# Patient Record
Sex: Male | Born: 1960
Health system: Southern US, Community
[De-identification: ages and names within clinical notes are randomized; demographics above are authoritative.]

## PROBLEM LIST (undated history)

## (undated) DIAGNOSIS — I639 Cerebral infarction, unspecified: Secondary | ICD-10-CM

## (undated) DIAGNOSIS — I10 Essential (primary) hypertension: Secondary | ICD-10-CM

## (undated) DIAGNOSIS — F1010 Alcohol abuse, uncomplicated: Secondary | ICD-10-CM

## (undated) HISTORY — PX: POLYPECTOMY: SHX149

## (undated) HISTORY — PX: COLONOSCOPY: SHX174

## (undated) HISTORY — DX: Cerebral infarction, unspecified: I63.9

---

## 2002-09-28 ENCOUNTER — Emergency Department (HOSPITAL_COMMUNITY): Admission: EM | Admit: 2002-09-28 | Discharge: 2002-09-28 | Payer: Self-pay | Admitting: Emergency Medicine

## 2003-09-14 ENCOUNTER — Emergency Department (HOSPITAL_COMMUNITY): Admission: EM | Admit: 2003-09-14 | Discharge: 2003-09-14 | Payer: Self-pay | Admitting: Emergency Medicine

## 2003-09-22 ENCOUNTER — Emergency Department (HOSPITAL_COMMUNITY): Admission: EM | Admit: 2003-09-22 | Discharge: 2003-09-22 | Payer: Self-pay | Admitting: Family Medicine

## 2003-10-10 ENCOUNTER — Emergency Department (HOSPITAL_COMMUNITY): Admission: EM | Admit: 2003-10-10 | Discharge: 2003-10-10 | Payer: Self-pay | Admitting: Family Medicine

## 2010-02-24 ENCOUNTER — Encounter: Payer: Self-pay | Admitting: Cardiology

## 2015-01-16 ENCOUNTER — Emergency Department (HOSPITAL_COMMUNITY): Payer: Medicaid Other

## 2015-01-16 ENCOUNTER — Inpatient Hospital Stay (HOSPITAL_COMMUNITY)
Admission: EM | Admit: 2015-01-16 | Discharge: 2015-01-19 | DRG: 065 | Disposition: A | Discharge status: Rehabilitation Facility | Payer: Medicaid Other | Attending: Internal Medicine | Admitting: Internal Medicine

## 2015-01-16 ENCOUNTER — Encounter (HOSPITAL_COMMUNITY): Payer: Self-pay | Admitting: Emergency Medicine

## 2015-01-16 DIAGNOSIS — Z720 Tobacco use: Secondary | ICD-10-CM | POA: Diagnosis present

## 2015-01-16 DIAGNOSIS — I63519 Cerebral infarction due to unspecified occlusion or stenosis of unspecified middle cerebral artery: Principal | ICD-10-CM | POA: Diagnosis present

## 2015-01-16 DIAGNOSIS — F1010 Alcohol abuse, uncomplicated: Secondary | ICD-10-CM | POA: Diagnosis present

## 2015-01-16 DIAGNOSIS — E785 Hyperlipidemia, unspecified: Secondary | ICD-10-CM | POA: Diagnosis present

## 2015-01-16 DIAGNOSIS — I639 Cerebral infarction, unspecified: Secondary | ICD-10-CM | POA: Diagnosis present

## 2015-01-16 DIAGNOSIS — F1290 Cannabis use, unspecified, uncomplicated: Secondary | ICD-10-CM | POA: Diagnosis present

## 2015-01-16 DIAGNOSIS — G459 Transient cerebral ischemic attack, unspecified: Secondary | ICD-10-CM

## 2015-01-16 DIAGNOSIS — Z9119 Patient's noncompliance with other medical treatment and regimen: Secondary | ICD-10-CM

## 2015-01-16 DIAGNOSIS — I10 Essential (primary) hypertension: Secondary | ICD-10-CM | POA: Diagnosis present

## 2015-01-16 DIAGNOSIS — G8191 Hemiplegia, unspecified affecting right dominant side: Secondary | ICD-10-CM | POA: Diagnosis present

## 2015-01-16 DIAGNOSIS — I6529 Occlusion and stenosis of unspecified carotid artery: Secondary | ICD-10-CM | POA: Diagnosis present

## 2015-01-16 DIAGNOSIS — I672 Cerebral atherosclerosis: Secondary | ICD-10-CM | POA: Diagnosis present

## 2015-01-16 DIAGNOSIS — F17210 Nicotine dependence, cigarettes, uncomplicated: Secondary | ICD-10-CM | POA: Diagnosis present

## 2015-01-16 DIAGNOSIS — Z8249 Family history of ischemic heart disease and other diseases of the circulatory system: Secondary | ICD-10-CM

## 2015-01-16 HISTORY — DX: Essential (primary) hypertension: I10

## 2015-01-16 LAB — CBC
HEMATOCRIT: 47.3 % (ref 39.0–52.0)
Hemoglobin: 16.5 g/dL (ref 13.0–17.0)
MCH: 31.1 pg (ref 26.0–34.0)
MCHC: 34.9 g/dL (ref 30.0–36.0)
MCV: 89.1 fL (ref 78.0–100.0)
PLATELETS: 284 10*3/uL (ref 150–400)
RBC: 5.31 MIL/uL (ref 4.22–5.81)
RDW: 12.8 % (ref 11.5–15.5)
WBC: 11.7 10*3/uL — AB (ref 4.0–10.5)

## 2015-01-16 LAB — BASIC METABOLIC PANEL
Anion gap: 11 (ref 5–15)
BUN: 12 mg/dL (ref 6–20)
CHLORIDE: 103 mmol/L (ref 101–111)
CO2: 23 mmol/L (ref 22–32)
Calcium: 9.3 mg/dL (ref 8.9–10.3)
Creatinine, Ser: 1.11 mg/dL (ref 0.61–1.24)
Glucose, Bld: 98 mg/dL (ref 65–99)
POTASSIUM: 3.5 mmol/L (ref 3.5–5.1)
SODIUM: 137 mmol/L (ref 135–145)

## 2015-01-16 LAB — URINALYSIS, ROUTINE W REFLEX MICROSCOPIC
Bilirubin Urine: NEGATIVE
GLUCOSE, UA: NEGATIVE mg/dL
Hgb urine dipstick: NEGATIVE
Ketones, ur: NEGATIVE mg/dL
LEUKOCYTES UA: NEGATIVE
NITRITE: NEGATIVE
PH: 6 (ref 5.0–8.0)
PROTEIN: NEGATIVE mg/dL
Specific Gravity, Urine: 1.008 (ref 1.005–1.030)

## 2015-01-16 LAB — CBG MONITORING, ED: Glucose-Capillary: 84 mg/dL (ref 65–99)

## 2015-01-16 LAB — ETHANOL: ALCOHOL ETHYL (B): 45 mg/dL — AB (ref <5)

## 2015-01-16 NOTE — ED Notes (Signed)
Dr Zenia Resides notified of right sided weakness and elevated blood pressure

## 2015-01-16 NOTE — ED Notes (Addendum)
Per pt and pt's friend state that starting yesterday around 1300 he began feeling numbness on the bottom of his right foot. It slowly spread to his right arm. Pt feels sensation equally on left and right side. Pt has facial symmetry. Pt is weaker in his right arm and right leg and experiences less dexterity with his right arm.  Pt states he drinks at least 6 beers per day every day

## 2015-01-16 NOTE — ED Notes (Signed)
Pt from home presenting with right sided weakness that started yesterday. It began in his right foot and spread up to his right arm. Pt has no known history of hypertension, but has a blood pressure of 181/105 at admission.

## 2015-01-16 NOTE — ED Notes (Signed)
Pt to CT

## 2015-01-17 ENCOUNTER — Encounter (HOSPITAL_COMMUNITY): Payer: Self-pay | Admitting: Internal Medicine

## 2015-01-17 ENCOUNTER — Observation Stay (HOSPITAL_COMMUNITY): Payer: Medicaid Other

## 2015-01-17 ENCOUNTER — Observation Stay (HOSPITAL_BASED_OUTPATIENT_CLINIC_OR_DEPARTMENT_OTHER): Payer: Medicaid Other

## 2015-01-17 DIAGNOSIS — I672 Cerebral atherosclerosis: Secondary | ICD-10-CM | POA: Diagnosis present

## 2015-01-17 DIAGNOSIS — I6789 Other cerebrovascular disease: Secondary | ICD-10-CM | POA: Diagnosis not present

## 2015-01-17 DIAGNOSIS — R030 Elevated blood-pressure reading, without diagnosis of hypertension: Secondary | ICD-10-CM

## 2015-01-17 DIAGNOSIS — Z9119 Patient's noncompliance with other medical treatment and regimen: Secondary | ICD-10-CM | POA: Diagnosis not present

## 2015-01-17 DIAGNOSIS — F17200 Nicotine dependence, unspecified, uncomplicated: Secondary | ICD-10-CM | POA: Diagnosis not present

## 2015-01-17 DIAGNOSIS — Z720 Tobacco use: Secondary | ICD-10-CM | POA: Diagnosis not present

## 2015-01-17 DIAGNOSIS — F1010 Alcohol abuse, uncomplicated: Secondary | ICD-10-CM | POA: Diagnosis present

## 2015-01-17 DIAGNOSIS — I63519 Cerebral infarction due to unspecified occlusion or stenosis of unspecified middle cerebral artery: Secondary | ICD-10-CM | POA: Diagnosis not present

## 2015-01-17 DIAGNOSIS — F17210 Nicotine dependence, cigarettes, uncomplicated: Secondary | ICD-10-CM | POA: Diagnosis present

## 2015-01-17 DIAGNOSIS — I10 Essential (primary) hypertension: Secondary | ICD-10-CM

## 2015-01-17 DIAGNOSIS — E785 Hyperlipidemia, unspecified: Secondary | ICD-10-CM | POA: Diagnosis present

## 2015-01-17 DIAGNOSIS — I6339 Cerebral infarction due to thrombosis of other cerebral artery: Secondary | ICD-10-CM

## 2015-01-17 DIAGNOSIS — G459 Transient cerebral ischemic attack, unspecified: Secondary | ICD-10-CM

## 2015-01-17 DIAGNOSIS — I639 Cerebral infarction, unspecified: Secondary | ICD-10-CM

## 2015-01-17 DIAGNOSIS — IMO0001 Reserved for inherently not codable concepts without codable children: Secondary | ICD-10-CM | POA: Insufficient documentation

## 2015-01-17 DIAGNOSIS — Z8249 Family history of ischemic heart disease and other diseases of the circulatory system: Secondary | ICD-10-CM | POA: Diagnosis not present

## 2015-01-17 DIAGNOSIS — G8191 Hemiplegia, unspecified affecting right dominant side: Secondary | ICD-10-CM | POA: Diagnosis not present

## 2015-01-17 DIAGNOSIS — G458 Other transient cerebral ischemic attacks and related syndromes: Secondary | ICD-10-CM

## 2015-01-17 DIAGNOSIS — I6529 Occlusion and stenosis of unspecified carotid artery: Secondary | ICD-10-CM | POA: Diagnosis present

## 2015-01-17 DIAGNOSIS — F1290 Cannabis use, unspecified, uncomplicated: Secondary | ICD-10-CM | POA: Diagnosis present

## 2015-01-17 DIAGNOSIS — I63411 Cerebral infarction due to embolism of right middle cerebral artery: Secondary | ICD-10-CM | POA: Diagnosis not present

## 2015-01-17 LAB — LIPID PANEL
CHOLESTEROL: 195 mg/dL (ref 0–200)
HDL: 48 mg/dL (ref >40)
LDL Cholesterol: 133 mg/dL — ABNORMAL HIGH (ref 0–99)
TRIGLYCERIDES: 72 mg/dL (ref <150)
Total CHOL/HDL Ratio: 4.1 RATIO
VLDL: 14 mg/dL (ref 0–40)

## 2015-01-17 LAB — RAPID URINE DRUG SCREEN, HOSP PERFORMED
AMPHETAMINES: NOT DETECTED
BARBITURATES: NOT DETECTED
Benzodiazepines: NOT DETECTED
Cocaine: NOT DETECTED
Opiates: NOT DETECTED
TETRAHYDROCANNABINOL: POSITIVE — AB

## 2015-01-17 LAB — TROPONIN I: Troponin I: <0.03 ng/mL (ref <0.031)

## 2015-01-17 MED ORDER — ADULT MULTIVITAMIN W/MINERALS CH
1.0000 | ORAL_TABLET | Freq: Every day | ORAL | Status: DC
  Administered 2015-01-17 – 2015-01-19 (×3): 1 via ORAL
  Filled 2015-01-17 (×3): qty 1

## 2015-01-17 MED ORDER — ACETAMINOPHEN 325 MG PO TABS: 650.0000 mg | ORAL_TABLET | ORAL | Status: DC | PRN

## 2015-01-17 MED ORDER — SENNOSIDES-DOCUSATE SODIUM 8.6-50 MG PO TABS: 1.0000 | ORAL_TABLET | Freq: Every evening | ORAL | Status: DC | PRN

## 2015-01-17 MED ORDER — THIAMINE HCL 100 MG/ML IJ SOLN: 100.0000 mg | Freq: Every day | INTRAMUSCULAR | Status: DC

## 2015-01-17 MED ORDER — VITAMIN B-1 100 MG PO TABS
100.0000 mg | ORAL_TABLET | Freq: Every day | ORAL | Status: DC
  Administered 2015-01-17 – 2015-01-19 (×3): 100 mg via ORAL
  Filled 2015-01-17 (×3): qty 1

## 2015-01-17 MED ORDER — ASPIRIN 300 MG RE SUPP: 300.0000 mg | Freq: Every day | RECTAL | Status: DC

## 2015-01-17 MED ORDER — ASPIRIN 325 MG PO TABS
325.0000 mg | ORAL_TABLET | Freq: Every day | ORAL | Status: DC
  Administered 2015-01-17 – 2015-01-19 (×3): 325 mg via ORAL
  Filled 2015-01-17 (×3): qty 1

## 2015-01-17 MED ORDER — ATORVASTATIN CALCIUM 40 MG PO TABS
40.0000 mg | ORAL_TABLET | Freq: Every day | ORAL | Status: DC
  Administered 2015-01-17 – 2015-01-18 (×2): 40 mg via ORAL
  Filled 2015-01-17 (×2): qty 1

## 2015-01-17 MED ORDER — LORAZEPAM 2 MG/ML IJ SOLN: 1.0000 mg | Freq: Four times a day (QID) | INTRAMUSCULAR | Status: DC | PRN

## 2015-01-17 MED ORDER — LORAZEPAM 1 MG PO TABS: 0.0000 mg | ORAL_TABLET | Freq: Two times a day (BID) | ORAL | Status: DC

## 2015-01-17 MED ORDER — SODIUM CHLORIDE 0.9 % IV SOLN
INTRAVENOUS | Status: DC
  Administered 2015-01-17: 04:00:00 via INTRAVENOUS

## 2015-01-17 MED ORDER — ACETAMINOPHEN 650 MG RE SUPP: 650.0000 mg | RECTAL | Status: DC | PRN

## 2015-01-17 MED ORDER — FOLIC ACID 1 MG PO TABS
1.0000 mg | ORAL_TABLET | Freq: Every day | ORAL | Status: DC
  Administered 2015-01-17 – 2015-01-19 (×3): 1 mg via ORAL
  Filled 2015-01-17 (×3): qty 1

## 2015-01-17 MED ORDER — LORAZEPAM 1 MG PO TABS: 1.0000 mg | ORAL_TABLET | Freq: Four times a day (QID) | ORAL | Status: DC | PRN

## 2015-01-17 MED ORDER — NICOTINE 21 MG/24HR TD PT24
21.0000 mg | MEDICATED_PATCH | Freq: Every day | TRANSDERMAL | Status: DC
  Administered 2015-01-17 – 2015-01-19 (×2): 21 mg via TRANSDERMAL
  Filled 2015-01-17 (×2): qty 1

## 2015-01-17 MED ORDER — STROKE: EARLY STAGES OF RECOVERY BOOK
Freq: Once | Status: AC
  Administered 2015-01-17: 1

## 2015-01-17 MED ORDER — LORAZEPAM 1 MG PO TABS: 0.0000 mg | ORAL_TABLET | Freq: Four times a day (QID) | ORAL | Status: AC

## 2015-01-17 NOTE — ED Provider Notes (Signed)
CSN: [id number]     Arrival date & time 01/16/15  2135 History  By signing my name below, I, Irene Pap, attest that this documentation has been prepared under the direction and in the presence of Shanon Rosser, MD. Electronically Signed: Irene Pap, ED Scribe. 01/17/2015. 12:47 AM.   Chief Complaint  Patient presents with  . Weakness    right side   The history is provided by the patient. No language interpreter was used.   HPI Comments: Michael Davenport is a 54 y.o. male who presents to the Emergency Department complaining of gradual onset weakness of the right side. It began in his right foot about 12:30 PM 2 days ago. It progressed to involve his right leg and arm. He reports that he fell twice while trying to ambulate yesterday. Symptoms were moderate. He states that his symptoms have gotten better sitting in the ED, but has not tried to ambulate again. Pt has not taken anything for his symptoms PTA. Per triage note, pt reports drinking 6 beers a day. He denies headache and numbness.   History reviewed. No pertinent past medical history. History reviewed. No pertinent past surgical history. No family history on file. Social History  Substance Use Topics  . Smoking status: Current Every Day Smoker -- 0.50 packs/day  . Smokeless tobacco: None  . Alcohol Use: 14.4 oz/week    24 Cans of beer per week    Review of Systems 10 Systems reviewed and all are negative for acute change except as noted in the HPI.  Allergies  Review of patient's allergies indicates no known allergies.  Home Medications   Prior to Admission medications   Not on File   BP 173/111 mmHg  Pulse 88  Temp(Src) 98 F (36.7 C) (Oral)  Resp 18  SpO2 96%   Physical Exam General: Well-developed, well-nourished male in no acute distress; appearance consistent with age of record HENT: normocephalic; atraumatic Eyes: pupils equal, round and reactive to light; extraocular muscles intact Neck: supple; no  carotid bruit Heart: regular rate and rhythm; no murmur Lungs: clear to auscultation bilaterally Abdomen: soft; nondistended; nontender; bowel sounds present; reducible umbilical hernia Extremities: No deformity; full range of motion; pulses normal Neurologic: Awake, alert and oriented; motor function intact in all extremities and symmetric except slight weakness in right lower extremity; sensation intact and symmetric; no facial droop Skin: Warm and dry Psychiatric: Normal mood and affect  ED Course  Procedures (including critical care time)   MDM   Nursing notes and vitals signs, including pulse oximetry, reviewed.  Summary of this visit's results, reviewed by myself:  Labs:  Results for orders placed or performed during the hospital encounter of 01/16/15 (from the past 24 hour(s))  Basic metabolic panel     Status: None   Collection Time: 01/16/15 10:04 PM  Result Value Ref Range   Sodium 137 135 - 145 mmol/L   Potassium 3.5 3.5 - 5.1 mmol/L   Chloride 103 101 - 111 mmol/L   CO2 23 22 - 32 mmol/L   Glucose, Bld 98 65 - 99 mg/dL   BUN 12 6 - 20 mg/dL   Creatinine, Ser 1.11 0.61 - 1.24 mg/dL   Calcium 9.3 8.9 - 10.3 mg/dL   GFR calc non Af Amer >60 >60 mL/min   GFR calc Af Amer >60 >60 mL/min   Anion gap 11 5 - 15  CBC     Status: Abnormal   Collection Time: 01/16/15 10:04 PM  Result Value  Ref Range   WBC 11.7 (H) 4.0 - 10.5 K/uL   RBC 5.31 4.22 - 5.81 MIL/uL   Hemoglobin 16.5 13.0 - 17.0 g/dL   HCT 47.3 39.0 - 52.0 %   MCV 89.1 78.0 - 100.0 fL   MCH 31.1 26.0 - 34.0 pg   MCHC 34.9 30.0 - 36.0 g/dL   RDW 12.8 11.5 - 15.5 %   Platelets 284 150 - 400 K/uL  Urinalysis, Routine w reflex microscopic (not at [facility])     Status: None   Collection Time: 01/16/15 10:11 PM  Result Value Ref Range   Color, Urine YELLOW YELLOW   APPearance CLEAR CLEAR   Specific Gravity, Urine 1.008 1.005 - 1.030   pH 6.0 5.0 - 8.0   Glucose, UA NEGATIVE NEGATIVE mg/dL   Hgb urine dipstick  NEGATIVE NEGATIVE   Bilirubin Urine NEGATIVE NEGATIVE   Ketones, ur NEGATIVE NEGATIVE mg/dL   Protein, ur NEGATIVE NEGATIVE mg/dL   Nitrite NEGATIVE NEGATIVE   Leukocytes, UA NEGATIVE NEGATIVE  CBG monitoring, ED     Status: None   Collection Time: 01/16/15 10:19 PM  Result Value Ref Range   Glucose-Capillary 84 65 - 99 mg/dL  Ethanol     Status: Abnormal   Collection Time: 01/16/15 11:13 PM  Result Value Ref Range   Alcohol, Ethyl (B) 45 (H) <5 mg/dL    Imaging Studies: Ct Head Wo Contrast  01/16/2015  CLINICAL DATA:  Numbness in right foot and arm EXAM: CT HEAD WITHOUT CONTRAST TECHNIQUE: Contiguous axial images were obtained from the base of the skull through the vertex without intravenous contrast. COMPARISON:  None. FINDINGS: Sinuses/Soft tissues: Clear paranasal sinuses and mastoid air cells. Intracranial: Cerebral and cerebellar atrophy which are age advanced. Mild to moderate low density in the periventricular white matter likely related to small vessel disease. No mass lesion, hemorrhage, hydrocephalus, acute infarct, intra-axial, or extra-axial fluid collection. IMPRESSION: 1.  No acute intracranial abnormality. 2.  Cerebral/cerebellar atrophy and small vessel ischemic change. Electronically Signed   By: Abigail Miyamoto M.D.   On: 01/16/2015 23:33   1:04 AM Dr. Roel Cluck to see patient, will transfer to [facility] for TIA workup. Dr. Nicole Kindred of neurology consulted.   Final diagnoses:  Transient cerebral ischemia, unspecified transient cerebral ischemia type   I personally performed the services described in this documentation, which was scribed in my presence. The recorded information has been reviewed and is accurate.    Shanon Rosser, MD 01/17/15 [id number]

## 2015-01-17 NOTE — Progress Notes (Signed)
[facility] PROGRESS NOTE  Michael Davenport [id number] DOB: 18-Nov-1960 DOA: 01/16/2015 PCP: No PCP Per Patient  Assessment/Plan: 1. Acute CVA. -[name] is a 54 year old gentleman with a history of alcohol abuse who presented to the emergency department with complaints of right-sided weakness. In the emergency department as how to be hypertensive. He had not been on antiplatelet therapy prior to this presentation. -Initial CT scan of brain not reveal acute intracranial abnormality. -Follow-up with MRI of brain did show acute left MCA territory infarct without hemorrhage. -Transthoracic echocardiogram showed an EF of 55-60% without wall motion abnormalities. -Carotid Dopplers revealed 1-39% ICA stenosis -Continue aspirin 325 mg by mouth daily and atorvastatin 40 mg by mouth daily  2.  History of alcohol abuse. -Patient does not have evidence of acute alcohol withdrawal. -He was placed on CIWA protocol  3.  Dyslipidemia -A.m. labs showing LDL of 133 -Continue statin therapy  Code Status: Full Code Family Communication: Spoke with his significant other at bedside Disposition Plan: PT/OT consult   Consultants:  Neurology   HPI/Subjective: Mr Michael Davenport is a pleasant 53 year old gentleman with history of hypertension, admitted to the medicine service on 01/16/2015 when he presented with complaints of right-sided weakness. He had not been on antiplatelet therapy prior to this presentation. MRI of brain showing acute left MCA territory infarct. Neurology was consulted.  Objective: Filed Vitals:   01/17/15 1606 01/17/15 1736  BP: 133/81 132/69  Pulse: 76 82  Temp: 98.4 F (36.9 C) 98.4 F (36.9 C)  Resp: 18 16    Intake/Output Summary (Last 24 hours) at 01/17/15 1746 Last data filed at 01/17/15 1625  Gross per 24 hour  Intake      0 ml  Output    750 ml  Net   -750 ml   Filed Weights   01/17/15 0319  Weight: 78.336 kg (172 lb 11.2 oz)    Exam:   General:   Patient is in no acute distress, sitting comfortably  Cardiovascular: Regular rate and rhythm normal S1-S2  Respiratory: Normal respiratory effort, lungs are clear to auscultation bilaterally  Abdomen: Soft nontender nondistended  Musculoskeletal: No edema  Neurological: He has 3-5 muscle strength to his right upper and right lower extremity. I do not note facial droop or slurred speech area left upper left lower extremities 5/5/muscle strength  Data Reviewed: Basic Metabolic Panel:  Recent Labs Lab 01/16/15 2204  NA 137  K 3.5  CL 103  CO2 23  GLUCOSE 98  BUN 12  CREATININE 1.11  CALCIUM 9.3   Liver Function Tests: No results for input(s): AST, ALT, ALKPHOS, BILITOT, PROT, ALBUMIN in the last 168 hours. No results for input(s): LIPASE, AMYLASE in the last 168 hours. No results for input(s): AMMONIA in the last 168 hours. CBC:  Recent Labs Lab 01/16/15 2204  WBC 11.7*  HGB 16.5  HCT 47.3  MCV 89.1  PLT 284   Cardiac Enzymes:  Recent Labs Lab 01/17/15 0750  TROPONINI <0.03   BNP (last 3 results) No results for input(s): BNP in the last 8760 hours.  ProBNP (last 3 results) No results for input(s): PROBNP in the last 8760 hours.  CBG:  Recent Labs Lab 01/16/15 2219  GLUCAP 84    No results found for this or any previous visit (from the past 240 hour(s)).   Studies: Dg Chest 2 View  01/17/2015  CLINICAL DATA:  Right side weakness EXAM: CHEST  2 VIEW COMPARISON:  10/10/2003 FINDINGS: The heart size and mediastinal  contours are within normal limits. Both lungs are clear. The visualized skeletal structures are unremarkable. IMPRESSION: No active cardiopulmonary disease. Electronically Signed   By: Rolm Baptise M.D.   On: 01/17/2015 10:52   Ct Head Wo Contrast  01/16/2015  CLINICAL DATA:  Numbness in right foot and arm EXAM: CT HEAD WITHOUT CONTRAST TECHNIQUE: Contiguous axial images were obtained from the base of the skull through the vertex without  intravenous contrast. COMPARISON:  None. FINDINGS: Sinuses/Soft tissues: Clear paranasal sinuses and mastoid air cells. Intracranial: Cerebral and cerebellar atrophy which are age advanced. Mild to moderate low density in the periventricular white matter likely related to small vessel disease. No mass lesion, hemorrhage, hydrocephalus, acute infarct, intra-axial, or extra-axial fluid collection. IMPRESSION: 1.  No acute intracranial abnormality. 2.  Cerebral/cerebellar atrophy and small vessel ischemic change. Electronically Signed   By: Abigail Miyamoto M.D.   On: 01/16/2015 23:33   Mr Brain Wo Contrast  01/17/2015  CLINICAL DATA:  RIGHT-sided weakness which began 01/15/2015. History of hypertension with noncompliance. History of smoking. History of alcohol abuse. EXAM: MRI HEAD WITHOUT CONTRAST MRA HEAD WITHOUT CONTRAST TECHNIQUE: Multiplanar, multiecho pulse sequences of the brain and surrounding structures were obtained without intravenous contrast. Angiographic images of the head were obtained using MRA technique without contrast. COMPARISON:  CT head 01/16/2015. FINDINGS: The patient was unable to remain motionless for the exam. Small or subtle lesions could be overlooked. MRI HEAD FINDINGS There is a moderate-sized area of restricted diffusion affecting the posterior lentiform nucleus, internal capsule, and periventricular white matter on the LEFT consistent with an acute lenticulostriate infarct. No similar findings elsewhere. No hemorrhage, mass lesion, hydrocephalus, or extra-axial fluid. Premature for age atrophy. Chronic microvascular ischemic change affects the periventricular and subcortical white matter. Scattered areas of remote lacunar infarction involve the deep nuclei on the LEFT and the subcortical white matter, as well as the LEFT middle cerebellar peduncle. No midline abnormality. Flow voids are maintained in the internal carotid, basilar, and vertebral arteries. Absent flow related enhancement  LEFT M1 MCA. Extracranial soft tissues are unremarkable. MRA HEAD FINDINGS The RIGHT internal carotid artery is widely patent in its cervical, petrous, cavernous, and supraclinoid segments. The LEFT ICA is widely patent in its cervical, petrous, and supraclinoid segments. 50% stenosis of the superior cavernous segment LICA. Basilar artery demonstrates focal stenosis proximally, estimated 50%. Both vertebrals contribute to formation of basilar, with mild irregularity on the RIGHT. Moderate stenosis on the LEFT distal most V4 vertebral segment estimated 50%. Moderately irregular RIGHT M1 MCA. Significant stenosis proximal to the bifurcation estimated at 75%. Proximal M2 outpouching suspected to represent a berry aneurysm, estimated 3 mm size, as seen on image 74 series 5. The study is too severely degraded from motion standpoint to confidently confirm, but CTA head recommended for further evaluation. Moderately diseased RIGHT A1 ACA throughout its course, with poor flow related enhancement in the RIGHT A2 and A3 segments. LEFT ICA terminates in robust A1 ACA which gives rise to a normal sized LEFT anterior cerebral artery distally. There is no flow related enhancement in the RIGHT M1 MCA nor is there significant reconstitution in its distal ramifications. Given the acute LEFT MCA lenticulostriate territory infarct, the LEFT MCA occlusion is likely acute. Mildly irregular LEFT P1 PCA. Moderately irregular RIGHT P1 PCA with severe stenosis in its distal ambient segment on the RIGHT. Severely diseased BILATERAL superior cerebellar arteries. Poorly visualized anterior inferior and posterior inferior cerebellar arteries. IMPRESSION: Acute LEFT MCA lenticulostriate territory infarct  without hemorrhage. LEFT M1 MCA large vessel occlusion. No significant recanalization of the LEFT M2 or M3 branches. Widespread intracranial atherosclerotic change as described, with potentially flow reducing lesions affecting the distal RIGHT  MCA, proximal RIGHT ACA, distal RIGHT PCA, and tandem non stenotic lesions affecting the LEFT vertebral and proximal basilar. 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 Consider further assessment with CTA head due to motion degraded series. Electronically Signed   By: Staci Righter M.D.   On: 01/17/2015 10:50   Mr Jodene Nam Head/brain Wo Cm  01/17/2015  CLINICAL DATA:  RIGHT-sided weakness which began 01/15/2015. History of hypertension with noncompliance. History of smoking. History of alcohol abuse. EXAM: MRI HEAD WITHOUT CONTRAST MRA HEAD WITHOUT CONTRAST TECHNIQUE: Multiplanar, multiecho pulse sequences of the brain and surrounding structures were obtained without intravenous contrast. Angiographic images of the head were obtained using MRA technique without contrast. COMPARISON:  CT head 01/16/2015. FINDINGS: The patient was unable to remain motionless for the exam. Small or subtle lesions could be overlooked. MRI HEAD FINDINGS There is a moderate-sized area of restricted diffusion affecting the posterior lentiform nucleus, internal capsule, and periventricular white matter on the LEFT consistent with an acute lenticulostriate infarct. No similar findings elsewhere. No hemorrhage, mass lesion, hydrocephalus, or extra-axial fluid. Premature for age atrophy. Chronic microvascular ischemic change affects the periventricular and subcortical white matter. Scattered areas of remote lacunar infarction involve the deep nuclei on the LEFT and the subcortical white matter, as well as the LEFT middle cerebellar peduncle. No midline abnormality. Flow voids are maintained in the internal carotid, basilar, and vertebral arteries. Absent flow related enhancement LEFT M1 MCA. Extracranial soft tissues are unremarkable. MRA HEAD FINDINGS The RIGHT internal carotid artery is widely patent in its cervical, petrous, cavernous, and supraclinoid segments. The LEFT ICA is widely patent in its cervical, petrous, and  supraclinoid segments. 50% stenosis of the superior cavernous segment LICA. Basilar artery demonstrates focal stenosis proximally, estimated 50%. Both vertebrals contribute to formation of basilar, with mild irregularity on the RIGHT. Moderate stenosis on the LEFT distal most V4 vertebral segment estimated 50%. Moderately irregular RIGHT M1 MCA. Significant stenosis proximal to the bifurcation estimated at 75%. Proximal M2 outpouching suspected to represent a berry aneurysm, estimated 3 mm size, as seen on image 74 series 5. The study is too severely degraded from motion standpoint to confidently confirm, but CTA head recommended for further evaluation. Moderately diseased RIGHT A1 ACA throughout its course, with poor flow related enhancement in the RIGHT A2 and A3 segments. LEFT ICA terminates in robust A1 ACA which gives rise to a normal sized LEFT anterior cerebral artery distally. There is no flow related enhancement in the RIGHT M1 MCA nor is there significant reconstitution in its distal ramifications. Given the acute LEFT MCA lenticulostriate territory infarct, the LEFT MCA occlusion is likely acute. Mildly irregular LEFT P1 PCA. Moderately irregular RIGHT P1 PCA with severe stenosis in its distal ambient segment on the RIGHT. Severely diseased BILATERAL superior cerebellar arteries. Poorly visualized anterior inferior and posterior inferior cerebellar arteries. IMPRESSION: Acute LEFT MCA lenticulostriate territory infarct without hemorrhage. LEFT M1 MCA large vessel occlusion. No significant recanalization of the LEFT M2 or M3 branches. Widespread intracranial atherosclerotic change as described, with potentially flow reducing lesions affecting the distal RIGHT MCA, proximal RIGHT ACA, distal RIGHT PCA, and tandem non stenotic lesions affecting the LEFT vertebral and proximal basilar. 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 Consider further assessment with CTA head due to motion  degraded series.  Electronically Signed   By: Staci Righter M.D.   On: 01/17/2015 10:50    Scheduled Meds: . aspirin  300 mg Rectal Daily   Or  . aspirin  325 mg Oral Daily  . atorvastatin  40 mg Oral q1800  . folic acid  1 mg Oral Daily  . LORazepam  0-4 mg Oral 4 times per day   Followed by  . [START ON 01/19/2015] LORazepam  0-4 mg Oral Q12H  . multivitamin with minerals  1 tablet Oral Daily  . thiamine  100 mg Oral Daily   Or  . thiamine  100 mg Intravenous Daily   Continuous Infusions: . sodium chloride 75 mL/hr at 01/17/15 0415    Active Problems:   TIA (transient ischemic attack)   Alcohol abuse   Tobacco abuse   Elevated BP   Acute CVA (cerebrovascular accident) ([place])    Time spent: 30 min    Kelvin Cellar  [facility] Pager [phone]. If 7PM-7AM, please contact night-coverage at [email or web address], password [facility] 01/17/2015, 5:46 PM  LOS: 0 days

## 2015-01-17 NOTE — Consult Note (Signed)
Admission H&P    Chief Complaint: New onset right-sided weakness.  HPI: Michael Davenport is an 54 y.o. male history of hypertension with noncompliance with treatment presenting with new onset right-sided weakness which started at about 12:30 PM on 01/15/2015. He has no previous history of stroke nor TIA. He has not been on antiplatelet therapy. CT scan of his head showed no acute intracranial abnormality. He said no changes in speech. He said difficulty with use of his right upper extremity, particularly with tasks that require dexterity such as writing. Patient has also been drinking 6-8 beers per day. NIH stroke score was 5.  LSN: 12:30 PM on 01/15/2015 tPA Given: No: Beyond time window for treatment consideration mRankin:  History reviewed. No pertinent past medical history.  History reviewed. No pertinent past surgical history.  Family History  Problem Relation Age of Onset  . Hypertension Other   . Hypertension Brother    Social History:  reports that he has been smoking.  He does not have any smokeless tobacco history on file. He reports that he drinks about 14.4 oz of alcohol per week. He reports that he does not use illicit drugs.  Allergies: No Known Allergies  No prescriptions prior to admission    ROS: History obtained from the patient  General ROS: negative for - chills, fatigue, fever, night sweats, weight gain or weight loss Psychological ROS: negative for - behavioral disorder, hallucinations, memory difficulties, mood swings or suicidal ideation Ophthalmic ROS: negative for - blurry vision, double vision, eye pain or loss of vision ENT ROS: negative for - epistaxis, nasal discharge, oral lesions, sore throat, tinnitus or vertigo Allergy and Immunology ROS: negative for - hives or itchy/watery eyes Hematological and Lymphatic ROS: negative for - bleeding problems, bruising or swollen lymph nodes Endocrine ROS: negative for - galactorrhea, hair pattern changes,  polydipsia/polyuria or temperature intolerance Respiratory ROS: negative for - cough, hemoptysis, shortness of breath or wheezing Cardiovascular ROS: negative for - chest pain, dyspnea on exertion, edema or irregular heartbeat Gastrointestinal ROS: negative for - abdominal pain, diarrhea, hematemesis, nausea/vomiting or stool incontinence Genito-Urinary ROS: negative for - dysuria, hematuria, incontinence or urinary frequency/urgency Musculoskeletal ROS: negative for - joint swelling or muscular weakness Neurological ROS: as noted in HPI Dermatological ROS: negative for rash and skin lesion changes  Physical Examination: Blood pressure 130/86, pulse 68, temperature 98.2 F (36.8 C), temperature source Oral, resp. rate 18, height '5\' 6"'  (1.676 m), weight 78.336 kg (172 lb 11.2 oz), SpO2 96 %.  HEENT-  Normocephalic, no lesions, without obvious abnormality.  Normal external eye and conjunctiva.  Normal TM's bilaterally.  Normal auditory canals and external ears. Normal external nose, mucus membranes and septum.  Normal pharynx. Neck supple with no masses, nodes, nodules or enlargement. Cardiovascular - regular rate and rhythm, S1, S2 normal, no murmur, click, rub or gallop Lungs - chest clear, no wheezing, rales, normal symmetric air entry Abdomen - soft, non-tender; bowel sounds normal; no masses,  no organomegaly Extremities - no joint deformities, effusion, or inflammation and no edema  Neurologic Examination: Mental Status: Alert, oriented, thought content appropriate.  Speech fluent without evidence of aphasia. Able to follow commands without difficulty. Cranial Nerves: II-Visual fields were normal. III/IV/VI-Pupils were equal and reacted normally to light. Extraocular movements were full and conjugate.    V/VII-no facial numbness; mild right lower facial weakness. VIII-normal. X-normal speech and symmetrical palatal movement. XI: trapezius strength/neck flexion strength normal  bilaterally XII-midline tongue extension with normal strength. Motor: Drift  of right arm and right leg; reduced grip strength of right hand compared to left. Sensory: Reduced perception of tactile sensation over right lower extremity compared to left lower extremity. Deep Tendon Reflexes: 2+ and symmetric. Plantars: Extensor on the right and flexor on the left Cerebellar: Normal finger-to-nose testing moderately impaired with use of right upper extremity. Carotid auscultation: Normal  Results for orders placed or performed during the hospital encounter of 01/16/15 (from the past 48 hour(s))  Basic metabolic panel     Status: None   Collection Time: 01/16/15 10:04 PM  Result Value Ref Range   Sodium 137 135 - 145 mmol/L   Potassium 3.5 3.5 - 5.1 mmol/L   Chloride 103 101 - 111 mmol/L   CO2 23 22 - 32 mmol/L   Glucose, Bld 98 65 - 99 mg/dL   BUN 12 6 - 20 mg/dL   Creatinine, Ser 1.11 0.61 - 1.24 mg/dL   Calcium 9.3 8.9 - 10.3 mg/dL   GFR calc non Af Amer >60 >60 mL/min   GFR calc Af Amer >60 >60 mL/min    Comment: (NOTE) The eGFR has been calculated using the CKD EPI equation. This calculation has not been validated in all clinical situations. eGFR's persistently <60 mL/min signify possible Chronic Kidney Disease.    Anion gap 11 5 - 15  CBC     Status: Abnormal   Collection Time: 01/16/15 10:04 PM  Result Value Ref Range   WBC 11.7 (H) 4.0 - 10.5 K/uL   RBC 5.31 4.22 - 5.81 MIL/uL   Hemoglobin 16.5 13.0 - 17.0 g/dL   HCT 47.3 39.0 - 52.0 %   MCV 89.1 78.0 - 100.0 fL   MCH 31.1 26.0 - 34.0 pg   MCHC 34.9 30.0 - 36.0 g/dL   RDW 12.8 11.5 - 15.5 %   Platelets 284 150 - 400 K/uL  Urinalysis, Routine w reflex microscopic (not at [facility])     Status: None   Collection Time: 01/16/15 10:11 PM  Result Value Ref Range   Color, Urine YELLOW YELLOW   APPearance CLEAR CLEAR   Specific Gravity, Urine 1.008 1.005 - 1.030   pH 6.0 5.0 - 8.0   Glucose, UA NEGATIVE NEGATIVE mg/dL   Hgb  urine dipstick NEGATIVE NEGATIVE   Bilirubin Urine NEGATIVE NEGATIVE   Ketones, ur NEGATIVE NEGATIVE mg/dL   Protein, ur NEGATIVE NEGATIVE mg/dL   Nitrite NEGATIVE NEGATIVE   Leukocytes, UA NEGATIVE NEGATIVE    Comment: MICROSCOPIC NOT DONE ON URINES WITH NEGATIVE PROTEIN, BLOOD, LEUKOCYTES, NITRITE, OR GLUCOSE <1000 mg/dL.  Urine rapid drug screen (hosp performed)     Status: Abnormal   Collection Time: 01/16/15 10:11 PM  Result Value Ref Range   Opiates NONE DETECTED NONE DETECTED   Cocaine NONE DETECTED NONE DETECTED   Benzodiazepines NONE DETECTED NONE DETECTED   Amphetamines NONE DETECTED NONE DETECTED   Tetrahydrocannabinol POSITIVE (A) NONE DETECTED   Barbiturates NONE DETECTED NONE DETECTED    Comment:        DRUG SCREEN FOR MEDICAL PURPOSES ONLY.  IF CONFIRMATION IS NEEDED FOR ANY PURPOSE, NOTIFY LAB WITHIN 5 DAYS.        LOWEST DETECTABLE LIMITS FOR URINE DRUG SCREEN Drug Class       Cutoff (ng/mL) Amphetamine      1000 Barbiturate      200 Benzodiazepine   622 Tricyclics       297 Opiates          300 Cocaine  300 THC              50   CBG monitoring, ED     Status: None   Collection Time: 01/16/15 10:19 PM  Result Value Ref Range   Glucose-Capillary 84 65 - 99 mg/dL  Ethanol     Status: Abnormal   Collection Time: 01/16/15 11:13 PM  Result Value Ref Range   Alcohol, Ethyl (B) 45 (H) <5 mg/dL    Comment:        LOWEST DETECTABLE LIMIT FOR SERUM ALCOHOL IS 5 mg/dL FOR MEDICAL PURPOSES ONLY    Ct Head Wo Contrast  01/16/2015  CLINICAL DATA:  Numbness in right foot and arm EXAM: CT HEAD WITHOUT CONTRAST TECHNIQUE: Contiguous axial images were obtained from the base of the skull through the vertex without intravenous contrast. COMPARISON:  None. FINDINGS: Sinuses/Soft tissues: Clear paranasal sinuses and mastoid air cells. Intracranial: Cerebral and cerebellar atrophy which are age advanced. Mild to moderate low density in the periventricular white  matter likely related to small vessel disease. No mass lesion, hemorrhage, hydrocephalus, acute infarct, intra-axial, or extra-axial fluid collection. IMPRESSION: 1.  No acute intracranial abnormality. 2.  Cerebral/cerebellar atrophy and small vessel ischemic change. Electronically Signed   By: Abigail Miyamoto M.D.   On: 01/16/2015 23:33    Assessment: 54 y.o. male with a history of hypertension and noncompliance with treatment, presenting with probable left subcortical ischemic cerebral infarction.  Stroke Risk Factors - hypertension and smoking  Plan: 1. HgbA1c, fasting lipid panel 2. MRI, MRA  of the brain without contrast 3. PT consult, OT consult, Speech consult 4. Echocardiogram 5. Carotid dopplers 6. Prophylactic therapy-Antiplatelet med: Aspirin 7. Risk factor modification 8. Telemetry monitoring  C.R. Nicole Kindred, MD [facility] [phone]  01/17/2015, 6:16 AM

## 2015-01-17 NOTE — ED Notes (Signed)
Attempt to call report to [phone] at [facility]. They will call back in 5 min

## 2015-01-17 NOTE — Progress Notes (Signed)
STROKE TEAM PROGRESS NOTE   HISTORY Michael Davenport is an 54 y.o. male history of hypertension with noncompliance with treatment presenting with new onset right-sided weakness which started at about 12:30 PM on 01/15/2015. He has no previous history of stroke nor TIA. He has not been on antiplatelet therapy. CT scan of his head showed no acute intracranial abnormality. He said no changes in speech. He said difficulty with use of his right upper extremity, particularly with tasks that require dexterity such as writing. Patient has also been drinking 6-8 beers per day. NIH stroke score was 5. He was last known well at 12:30 PM on 01/15/2015. Patient was not administered TPA secondary to beyond time window for treatment consideration. He was admitted for further evaluation and treatment.   SUBJECTIVE (INTERVAL HISTORY) His friend Michael Davenport is at the bedside.  Overall he feels his condition is stable. I obtained patient's history in detail and reviewed imaging studies   OBJECTIVE Temp:  [98 F (36.7 C)-99 F (37.2 C)] 98.4 F (36.9 C) (12/13 1316) Pulse Rate:  [68-112] 91 (12/13 1316) Cardiac Rhythm:  [-] Normal sinus rhythm (12/13 0700) Resp:  [15-20] 17 (12/13 1316) BP: (130-181)/(86-118) 175/98 mmHg (12/13 1316) SpO2:  [95 %-99 %] 95 % (12/13 1316) Weight:  [78.336 kg (172 lb 11.2 oz)] 78.336 kg (172 lb 11.2 oz) (12/13 0319)  CBC:  Recent Labs Lab 01/16/15 2204  WBC 11.7*  HGB 16.5  HCT 47.3  MCV 89.1  PLT [id number]    Basic Metabolic Panel:  Recent Labs Lab 01/16/15 2204  NA 137  K 3.5  CL 103  CO2 23  GLUCOSE 98  BUN 12  CREATININE 1.11  CALCIUM 9.3    Lipid Panel:    Component Value Date/Time   CHOL 195 01/17/2015 0750   TRIG 72 01/17/2015 0750   HDL 48 01/17/2015 0750   CHOLHDL 4.1 01/17/2015 0750   VLDL 14 01/17/2015 0750   LDLCALC 133* 01/17/2015 0750   HgbA1c: No results found for: HGBA1C Urine Drug Screen:    Component Value Date/Time   LABOPIA NONE DETECTED  01/16/2015 2211   COCAINSCRNUR NONE DETECTED 01/16/2015 2211   LABBENZ NONE DETECTED 01/16/2015 2211   AMPHETMU NONE DETECTED 01/16/2015 2211   THCU POSITIVE* 01/16/2015 2211   LABBARB NONE DETECTED 01/16/2015 2211      IMAGING  Dg Chest 2 View 01/17/2015   No active cardiopulmonary disease.   Ct Head Wo Contrast 01/16/2015   1.  No acute intracranial abnormality. 2.  Cerebral/cerebellar atrophy and small vessel ischemic change.   Mri &  Mra Head/brain Wo Cm 01/17/2015  Acute LEFT MCA lenticulostriate territory infarct without hemorrhage. LEFT M1 MCA large vessel occlusion. No significant recanalization of the LEFT M2 or M3 branches. Widespread intracranial atherosclerotic change as described, with potentially flow reducing lesions affecting the distal RIGHT MCA, proximal RIGHT ACA, distal RIGHT PCA, and tandem non stenotic lesions affecting the LEFT vertebral and proximal basilar. 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 Consider further assessment with CTA head due to motion degraded series.   Carotid Doppler   There is 1-39% bilateral ICA stenosis. Vertebral artery flow is antegrade.    2D Echocardiogram  - Left ventricle: The cavity size was normal. There was moderatefocal basal and mild concentric hypertrophy. Systolic functionwas normal. The estimated ejection fraction was in the range of55% to 60%. Wall motion was normal; there were no regional wallmotion abnormalities. - Atrial septum: There was increased thickness of the septum,consistent  with lipomatous hypertrophy.   PHYSICAL EXAM Obese middle-aged African-American male not in distress. . Afebrile. Head is nontraumatic. Neck is supple without bruit.    Cardiac exam no murmur or gallop. Lungs are clear to auscultation. Distal pulses are well felt. Neurological Exam :  Awake alert oriented 3 with normal speech and language function. No aphasia or apraxia dysarthria. Extraocular movements are full range  without nystagmus. Pupils are equal reactive. Fundi were not visualized. Vision acuity seems adequate. Mild right lower facial weakness. Tongue midline. Motor system exam reveals right upper and lower extremity drift. Significant weakness of right grip and intrinsic hand muscles. 4/5 proximal right upper and lower extremity weakness. Weakness of right hip vigorous and ankle dorsiflexors. Coordination is impaired on the right normal on the left. Sensation appears preserved bilaterally. Deep tendon reflexes are 2+ symmetric. Plantars are downgoing. Gait was not tested. ASSESSMENT/PLAN [name] is a 54 y.o. male with no significant past medical history presenting with right sided weakness. He did not receive IV t-PA due to delay in arrival.   Stroke:  Dominant left PLIC infarct secondary to small vessel disease source  Resultant  Right hemiparesis  MRI  L PLIC infarct. Old lacunar infarcts  MRA  L M1 disease, widespread intracranial atherosclerosis  Carotid Doppler  No significant stenosis   2D Echo  No source of embolus   LDL 133  HgbA1c pending  SCDs for VTE prophylaxis  Diet Heart Room service appropriate?: Yes; Fluid consistency:: Thin  No antithrombotic prior to admission, now on aspirin 325 mg daily  Patient counseled to be compliant with his antithrombotic medications  Ongoing aggressive stroke risk factor management  Therapy recommendations:  pending   Disposition:  pending   Hypertension  Stable Permissive hypertension (OK if < 220/120) but gradually normalize in 5-7 days  Hyperlipidemia  Home meds:  No statin  LDL 133, goal < 70  Added lipitor 40 mg daily  Continue statin at discharge  Other Stroke Risk Factors  THC use, UDS positive this admission  Cigarette smoker, advised to stop smoking  ETOH use  Hospital day # 0  Radene Journey [facility] for Pager information 01/17/2015 2:09 PM  I have personally examined this  patient, reviewed notes, independently viewed imaging studies, participated in medical decision making and plan of care. I have made any additions or clarifications directly to the above note. Agree with note above. He presented with right-sided weakness secondary to left internal capsule infarct. MRI scan also shows previous silent infarcts as well as left middle cerebral artery occlusion. He remains at risk for neurological worsening, recurrent stroke, TIA needs ongoing stroke evaluation and aggressive risk factor modification. I counseled the patient to quit smoking and to take aspirin and be compliant with his medical follow-up. He voiced understanding.  Antony Contras, MD Medical Director [facility] Pager: [phone] 01/17/2015 4:14 PM    To contact Stroke Continuity provider, please refer to [email or web address] After hours, contact General Neurology

## 2015-01-17 NOTE — Progress Notes (Signed)
*  PRELIMINARY RESULTS* Vascular Ultrasound Carotid Duplex (Doppler) has been completed.  Preliminary findings: Bilateral:  1-39% ICA stenosis.  Vertebral artery flow is antegrade.      Landry Mellow, RDMS, RVT  01/17/2015, 12:04 PM

## 2015-01-17 NOTE — Progress Notes (Signed)
  Echocardiogram 2D Echocardiogram has been performed.  Lear Carstens 01/17/2015, 12:01 PM

## 2015-01-17 NOTE — H&P (Signed)
PCP: None   Referring provider Dr. Florina Ou   Chief Complaint:  Right side intermittent weakness  HPI: Michael Davenport is a 54 y.o. male   has no past medical history on file.   Presented with  Patient presented to emergency department today with right-sided weakness since yesterday. He had a few falls yesterday he would stand up and his right leg would give out. Then he developed similar symptoms in his right arm. On presentation to emerge department patient should see blood pressure was noted to be elevated to 180 02/05/2003 although he has no prior history of hypertension. He has history of alcohol abuse for years. He drinks about 6-8 beers per day. Denies history of seizure or severe withdrawal. Last ETOH was 12/12 at 4 PM States currently this sensation has resumed and he cannot appreciate any weakness at all physical exam ER provided did notice some discrepancy in strength examination does endorse decreased decreased dexterity in  right arm. In emergency department CT scan of the head was done which showed no acute intracranial abnormality there was some evidence of atrophy. ER provider has discussed case with Dr. Nicole Kindred who was aware patient is coming transferred to [facility] was called for admission for possible TIA.   Review of Systems:    Pertinent positives include: Leg and arm weakness, localizing neurological complaints  Constitutional:  No weight loss, night sweats, Fevers, chills, fatigue, weight loss  HEENT:  No headaches, Difficulty swallowing,Tooth/dental problems,Sore throat,  No sneezing, itching, ear ache, nasal congestion, post nasal drip,  Cardio-vascular:  No chest pain, Orthopnea, PND, anasarca, dizziness, palpitations.no Bilateral lower extremity swelling  GI:  No heartburn, indigestion, abdominal pain, nausea, vomiting, diarrhea, change in bowel habits, loss of appetite, melena, blood in stool, hematemesis Resp:  no shortness of breath at  rest. No dyspnea on exertion, No excess mucus, no productive cough, No non-productive cough, No coughing up of blood.No change in color of mucus.No wheezing. Skin:  no rash or lesions. No jaundice GU:  no dysuria, change in color of urine, no urgency or frequency. No straining to urinate.  No flank pain.  Musculoskeletal:  No joint pain or no joint swelling. No decreased range of motion. No back pain.  Psych:  No change in mood or affect. No depression or anxiety. No memory loss.  Neuro  no tingling,  no double vision, no gait abnormality, no slurred speech, no confusion  Otherwise ROS are negative except for above, 10 systems were reviewed  Past Medical History: History reviewed. No pertinent past medical history. History reviewed. No pertinent past surgical history.   Medications: Prior to Admission medications   Not on File    Allergies:  No Known Allergies  Social History:  Ambulatory  Independently  Lives at home with a friend     reports that he has been smoking.  He does not have any smokeless tobacco history on file. He reports that he drinks about 14.4 oz of alcohol per week. He reports that he does not use illicit drugs.    Family History: family history includes Hypertension in his brother and other.    Physical Exam: Patient Vitals for the past 24 hrs:  BP Temp Temp src Pulse Resp SpO2  01/17/15 0100 (!) 171/110 mmHg - - - 20 -  01/16/15 2300 (!) 173/111 mmHg - - 88 18 96 %  01/16/15 2245 (!) 161/107 mmHg - - 88 16 97 %  01/16/15 2230 (!) 171/104 mmHg - -  94 15 98 %  01/16/15 2215 (!) 167/105 mmHg - - 101 - 98 %  01/16/15 2142 (!) 181/105 mmHg 98 F (36.7 C) Oral 112 16 97 %    1. General:  in No Acute distress 2. Psychological: Alert and   Oriented 3. Head/ENT:     Dry Mucous Membranes                          Head Non traumatic, neck supple                          Normal   Dentition 4. SKIN: decreased Skin turgor,  Skin clean Dry and intact no  rash 5. Heart: Regular rate and rhythm no Murmur, Rub or gallop 6. Lungs: Clear to auscultation bilaterally, no wheezes or crackles   7. Abdomen: Soft, non-tender, Non distended 8. Lower extremities: no clubbing, cyanosis, or edema 9. Neurologically minimal right side weakness, cranial nerves II through XII intact, nystagmus present 10. MSK: Normal range of motion  body mass index is unknown because there is no height or weight on file.   Labs on Admission:   Results for orders placed or performed during the hospital encounter of 01/16/15 (from the past 24 hour(s))  Basic metabolic panel     Status: None   Collection Time: 01/16/15 10:04 PM  Result Value Ref Range   Sodium 137 135 - 145 mmol/L   Potassium 3.5 3.5 - 5.1 mmol/L   Chloride 103 101 - 111 mmol/L   CO2 23 22 - 32 mmol/L   Glucose, Bld 98 65 - 99 mg/dL   BUN 12 6 - 20 mg/dL   Creatinine, Ser 1.11 0.61 - 1.24 mg/dL   Calcium 9.3 8.9 - 10.3 mg/dL   GFR calc non Af Amer >60 >60 mL/min   GFR calc Af Amer >60 >60 mL/min   Anion gap 11 5 - 15  CBC     Status: Abnormal   Collection Time: 01/16/15 10:04 PM  Result Value Ref Range   WBC 11.7 (H) 4.0 - 10.5 K/uL   RBC 5.31 4.22 - 5.81 MIL/uL   Hemoglobin 16.5 13.0 - 17.0 g/dL   HCT 47.3 39.0 - 52.0 %   MCV 89.1 78.0 - 100.0 fL   MCH 31.1 26.0 - 34.0 pg   MCHC 34.9 30.0 - 36.0 g/dL   RDW 12.8 11.5 - 15.5 %   Platelets 284 150 - 400 K/uL  Urinalysis, Routine w reflex microscopic (not at [facility])     Status: None   Collection Time: 01/16/15 10:11 PM  Result Value Ref Range   Color, Urine YELLOW YELLOW   APPearance CLEAR CLEAR   Specific Gravity, Urine 1.008 1.005 - 1.030   pH 6.0 5.0 - 8.0   Glucose, UA NEGATIVE NEGATIVE mg/dL   Hgb urine dipstick NEGATIVE NEGATIVE   Bilirubin Urine NEGATIVE NEGATIVE   Ketones, ur NEGATIVE NEGATIVE mg/dL   Protein, ur NEGATIVE NEGATIVE mg/dL   Nitrite NEGATIVE NEGATIVE   Leukocytes, UA NEGATIVE NEGATIVE  CBG monitoring, ED     Status:  None   Collection Time: 01/16/15 10:19 PM  Result Value Ref Range   Glucose-Capillary 84 65 - 99 mg/dL  Ethanol     Status: Abnormal   Collection Time: 01/16/15 11:13 PM  Result Value Ref Range   Alcohol, Ethyl (B) 45 (H) <5 mg/dL    UA no evidence of  UTI  No results found for: HGBA1C  CrCl cannot be calculated (Unknown ideal weight.).  BNP (last 3 results) No results for input(s): PROBNP in the last 8760 hours.  Other results:  I have pearsonaly reviewed this: ECG REPORT  Rate: 103  Rhythm: Sinus tachycardia ST&T Change: No ischemic changes AGC 467  There were no vitals filed for this visit.   Cultures: No results found for: SDES, [place], CULT, REPTSTATUS   Radiological Exams on Admission: Ct Head Wo Contrast  01/16/2015  CLINICAL DATA:  Numbness in right foot and arm EXAM: CT HEAD WITHOUT CONTRAST TECHNIQUE: Contiguous axial images were obtained from the base of the skull through the vertex without intravenous contrast. COMPARISON:  None. FINDINGS: Sinuses/Soft tissues: Clear paranasal sinuses and mastoid air cells. Intracranial: Cerebral and cerebellar atrophy which are age advanced. Mild to moderate low density in the periventricular white matter likely related to small vessel disease. No mass lesion, hemorrhage, hydrocephalus, acute infarct, intra-axial, or extra-axial fluid collection. IMPRESSION: 1.  No acute intracranial abnormality. 2.  Cerebral/cerebellar atrophy and small vessel ischemic change. Electronically Signed   By: Abigail Miyamoto M.D.   On: 01/16/2015 23:33    Chart has been reviewed  Family not at  Bedside Friend is at bedside  Assessment/Plan 38 year-old gentleman history of tobacco and alcohol abuse presents with intermittent right-sided weakness worrisome for TIA  Present on Admission:  . TIA (transient ischemic attack) -  - will admit based on TIA/CVA protocol, await results of MRA/MRI, Carotid Doppler and Echo, obtain cardiac enzymes,  ECG,    Lipid panel, TSH. Order PT/OT evaluation. Will make sure patient is on antiplatelet agent.   Neurology consulted.     . Alcohol abuse -  CIWAprotocol . Tobacco abuse discussed importance of quitting will order tobacco cessation education  . Elevated BP-permissive hypertension for now we need to continue to follow may need to be started on agent   Prophylaxis: SCD    CODE STATUS:  FULL CODE   as per patient    Disposition:   To home once workup is complete and patient is stable  Other plan as per orders.  I have spent a total of 55 min on this admission  Treanna Dumler 01/17/2015, 1:10 AM  [facility]  Pager [phone]   after 2 AM please page floor coverage PA If 7AM-7PM, please contact the day team taking care of the patient  [email or web address]  Password [email or web address]

## 2015-01-18 ENCOUNTER — Encounter (HOSPITAL_COMMUNITY): Payer: Self-pay | Admitting: Physical Medicine and Rehabilitation

## 2015-01-18 DIAGNOSIS — Z720 Tobacco use: Secondary | ICD-10-CM

## 2015-01-18 DIAGNOSIS — I10 Essential (primary) hypertension: Secondary | ICD-10-CM | POA: Diagnosis present

## 2015-01-18 DIAGNOSIS — F1210 Cannabis abuse, uncomplicated: Secondary | ICD-10-CM

## 2015-01-18 LAB — HEMOGLOBIN A1C
Hgb A1c MFr Bld: 5.7 % — ABNORMAL HIGH (ref 4.8–5.6)
Mean Plasma Glucose: 117 mg/dL

## 2015-01-18 NOTE — Progress Notes (Signed)
Occupational Therapy Evaluation Patient Details Name: Michael Davenport MRN: [id number] DOB: 1960-04-18 Today's Date: 01/18/2015    History of Present Illness 54 y.o. male admitted for progressive R side weakness beginning on 01/15/2015. MRI + for L MCA infarct.    Clinical Impression   PTA, pt was independent with all ADLs and functional mobility. Pt is very motivated and currently presents with R side hemiparesis and required min assist for transfers, balance, and self-care tasks. Provided pt with HEP to increase hand strength and fine motor coordination. Pt will benefit from acute skilled OT to increase safety and independence with ADLs and functional mobility. Recommending CIR as pt appears to have the potential to reach mod I level with ADLs and functional mobility with intensive post-acute rehab stay.     Follow Up Recommendations  CIR    Equipment Recommendations  Other (comment) (TBD in next venue)    Recommendations for Other Services Rehab consult     Precautions / Restrictions Precautions Precautions: Fall Restrictions Weight Bearing Restrictions: No      Mobility Bed Mobility                  Transfers Overall transfer level: Needs assistance Equipment used: Rolling walker (2 wheeled) Transfers: Sit to/from Stand Sit to Stand: Min assist         General transfer comment: Min assist for balance pt leaning slightly to R side. Verbal cues for safe hand placement upon standing and prior to sitting.     Balance Overall balance assessment: Needs assistance Sitting-balance support: No upper extremity supported;Feet supported Sitting balance-Leahy Scale: Good     Standing balance support: During functional activity;Bilateral upper extremity supported Standing balance-Leahy Scale: Poor Standing balance comment: R knee buckling and drifting forward while standing at sink. Min assist to maintain balance.                            ADL Overall  ADL's : Needs assistance/impaired     Grooming: Wash/dry hands;Wash/dry face;Oral care;Minimal assistance;Cueing for safety;Standing Grooming Details (indicate cue type and reason): Verbal cues to incorporate RUE during all tasks and for awareness of positioning of R leg                 Toilet Transfer: Minimal assistance;Cueing for safety;Ambulation;Regular Toilet;Grab bars;RW Armed forces technical officer Details (indicate cue type and reason): Verbal cues for safe hand placement before sitting. Toileting- Clothing Manipulation and Hygiene: Minimal assistance;Cueing for safety;Sit to/from stand Toileting - Clothing Manipulation Details (indicate cue type and reason): Verbal cues for safe R hand placement and to stand up stall on R leg before pulling up underwear     Functional mobility during ADLs: Minimal assistance;Rolling walker General ADL Comments: Pt very motivated and responsive to encouragement to use RUE during all self-care tasks despite weakness and decreased fine motor coordination. Pt's HR increased to 120-125 during self-care tasks at sink and returned to <90 quickly after sitting EOB for 30 seconds. Verbal cues throughout for safe use and hand placement while using RW. Provided pt with HEP with squeeze ball and theraputty - pt immediately began using and demonstrated understanding of exercises.      Vision Vision Assessment?: Yes Eye Alignment: Within Functional Limits Ocular Range of Motion: Within Functional Limits Alignment/Gaze Preference: Within Defined Limits Tracking/Visual Pursuits: Able to track stimulus in all quads without difficulty Saccades: Within functional limits Convergence: Impaired (comment) (L eye drift to midline - unable to maintain  medial position) Visual Fields: No apparent deficits   Perception     Praxis      Pertinent Vitals/Pain Pain Assessment: No/denies pain     Hand Dominance Right   Extremity/Trunk Assessment Upper Extremity  Assessment Upper Extremity Assessment: RUE deficits/detail RUE Deficits / Details: STRENGTH: all 4/5, decreased grip strength; AROM: WFL; SENSATION: intact; COORDINATION: decreased fine motor coordination especially thumb opposition RUE Sensation: decreased proprioception RUE Coordination: decreased fine motor;decreased gross motor   Lower Extremity Assessment Lower Extremity Assessment: RLE deficits/detail RLE Deficits / Details: STRENGTH: knee flexion/extension 4/5; AROM: WFL; SENSATION: intact RLE Sensation: decreased proprioception RLE Coordination: decreased gross motor   Cervical / Trunk Assessment Cervical / Trunk Assessment: Normal   Communication Communication Communication: No difficulties   Cognition Arousal/Alertness: Awake/alert Behavior During Therapy: WFL for tasks assessed/performed Overall Cognitive Status: Within Functional Limits for tasks assessed                     General Comments       Exercises       Shoulder Instructions      Home Living Family/patient expects to be discharged to:: Private residence Living Arrangements: Non-relatives/Friends Available Help at Discharge: Friend(s);Available PRN/intermittently Type of Home: House Home Access: Level entry     Home Layout: One level     Bathroom Shower/Tub: Tub/shower unit Shower/tub characteristics: Curtain Biochemist, clinical: Standard     Home Equipment: Environmental consultant - 2 wheels   Additional Comments: Pt's friend may have BSC and shower seat, but pt is unsure.      Prior Functioning/Environment Level of Independence: Independent        Comments: Loves to garden, not working or driving    OT Diagnosis: Paresis (R side)   OT Problem List: Decreased strength;Decreased activity tolerance;Impaired balance (sitting and/or standing);Decreased coordination;Decreased safety awareness;Decreased knowledge of use of DME or AE;Impaired UE functional use   OT Treatment/Interventions:  Self-care/ADL training;Therapeutic exercise;DME and/or AE instruction;Therapeutic activities;Patient/family education;Balance training    OT Goals(Current goals can be found in the care plan section) Acute Rehab OT Goals Patient Stated Goal: to get back to normal so he can return to gardening in March OT Goal Formulation: With patient Time For Goal Achievement: 02/01/15 Potential to Achieve Goals: Good ADL Goals Pt Will Perform Upper Body Bathing: with modified independence;sitting Pt Will Perform Lower Body Bathing: with modified independence;sitting/lateral leans;sit to/from stand Pt Will Perform Upper Body Dressing: with modified independence;sitting Pt Will Perform Lower Body Dressing: with modified independence;sitting/lateral leans;sit to/from stand Pt Will Transfer to Toilet: with modified independence;ambulating;bedside commode Pt Will Perform Toileting - Clothing Manipulation and hygiene: with modified independence;sitting/lateral leans;sit to/from stand Pt Will Perform Tub/Shower Transfer: Tub transfer;with supervision;ambulating;shower seat;rolling walker Pt/caregiver will Perform Home Exercise Program: Increased strength;Right Upper extremity;Increased ROM;With theraputty;Independently;With written HEP provided  OT Frequency: Min 3X/week   Barriers to D/C: Decreased caregiver support  Family in area unable to provide any assistance; friend he lives with works during the day       Co-evaluation              End of Session Equipment Utilized During Treatment: Administrator, arts Communication: Mobility status;Other (comment) (Pt up in chair; HR increase during activity)  Activity Tolerance: Patient tolerated treatment well Patient left: in chair;with call bell/phone within reach;with chair alarm set   Time: [id number] OT Time Calculation (min): 42 min Charges:  OT General Charges $OT Visit: 1 Procedure OT Evaluation $Initial OT Evaluation Tier I: 1  Procedure OT Treatments $Self Care/Home Management : 23-37 mins G-Codes:    Redmond Baseman, OTR/L January 23, 2015, 11:04 AM

## 2015-01-18 NOTE — Progress Notes (Signed)
STROKE TEAM PROGRESS NOTE   HISTORY Michael Davenport is an 54 y.o. male history of hypertension with noncompliance with treatment presenting with new onset right-sided weakness which started at about 12:30 PM on 01/15/2015. He has no previous history of stroke nor TIA. He has not been on antiplatelet therapy. CT scan of his head showed no acute intracranial abnormality. He said no changes in speech. He said difficulty with use of his right upper extremity, particularly with tasks that require dexterity such as writing. Patient has also been drinking 6-8 beers per day. NIH stroke score was 5. He was last known well at 12:30 PM on 01/15/2015. Patient was not administered TPA secondary to beyond time window for treatment consideration. He was admitted for further evaluation and treatment.   SUBJECTIVE (INTERVAL HISTORY) His friend Michael Davenport is at the bedside.  Overall he feels his condition is stable. I obtained patient's history in detail and reviewed imaging studies   OBJECTIVE Temp:  [97.9 F (36.6 C)-98.7 F (37.1 C)] 98.1 F (36.7 C) (12/14 1449) Pulse Rate:  [64-98] 68 (12/14 1449) Cardiac Rhythm:  [-] Normal sinus rhythm (12/14 0829) Resp:  [16-18] 18 (12/14 1449) BP: (132-190)/(69-101) 174/101 mmHg (12/14 1449) SpO2:  [97 %-99 %] 99 % (12/14 1449)  CBC:   Recent Labs Lab 01/16/15 2204  WBC 11.7*  HGB 16.5  HCT 47.3  MCV 89.1  PLT [id number]    Basic Metabolic Panel:   Recent Labs Lab 01/16/15 2204  NA 137  K 3.5  CL 103  CO2 23  GLUCOSE 98  BUN 12  CREATININE 1.11  CALCIUM 9.3    Lipid Panel:     Component Value Date/Time   CHOL 195 01/17/2015 0750   TRIG 72 01/17/2015 0750   HDL 48 01/17/2015 0750   CHOLHDL 4.1 01/17/2015 0750   VLDL 14 01/17/2015 0750   LDLCALC 133* 01/17/2015 0750   HgbA1c:  Lab Results  Component Value Date   HGBA1C 5.7* 01/17/2015   Urine Drug Screen:     Component Value Date/Time   LABOPIA NONE DETECTED 01/16/2015 2211   COCAINSCRNUR  NONE DETECTED 01/16/2015 2211   LABBENZ NONE DETECTED 01/16/2015 2211   AMPHETMU NONE DETECTED 01/16/2015 2211   THCU POSITIVE* 01/16/2015 2211   LABBARB NONE DETECTED 01/16/2015 2211      IMAGING  Dg Chest 2 View 01/17/2015   No active cardiopulmonary disease.   Ct Head Wo Contrast 01/16/2015   1.  No acute intracranial abnormality. 2.  Cerebral/cerebellar atrophy and small vessel ischemic change.   Mri &  Mra Head/brain Wo Cm 01/17/2015  Acute LEFT MCA lenticulostriate territory infarct without hemorrhage. LEFT M1 MCA large vessel occlusion. No significant recanalization of the LEFT M2 or M3 branches. Widespread intracranial atherosclerotic change as described, with potentially flow reducing lesions affecting the distal RIGHT MCA, proximal RIGHT ACA, distal RIGHT PCA, and tandem non stenotic lesions affecting the LEFT vertebral and proximal basilar. 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 Consider further assessment with CTA head due to motion degraded series.   Carotid Doppler   There is 1-39% bilateral ICA stenosis. Vertebral artery flow is antegrade.    2D Echocardiogram  - Left ventricle: The cavity size was normal. There was moderatefocal basal and mild concentric hypertrophy. Systolic functionwas normal. The estimated ejection fraction was in the range of55% to 60%. Wall motion was normal; there were no regional wallmotion abnormalities. - Atrial septum: There was increased thickness of the septum,consistent with lipomatous hypertrophy.  PHYSICAL EXAM Obese middle-aged African-American male not in distress. . Afebrile. Head is nontraumatic. Neck is supple without bruit.    Cardiac exam no murmur or gallop. Lungs are clear to auscultation. Distal pulses are well felt. Neurological Exam :  Awake alert oriented 3 with normal speech and language function. No aphasia or apraxia dysarthria. Extraocular movements are full range without nystagmus. Pupils are equal  reactive. Fundi were not visualized. Vision acuity seems adequate. Mild right lower facial weakness. Tongue midline. Motor system exam reveals right upper and lower extremity drift. Significant weakness of right grip and intrinsic hand muscles. 4/5 proximal right upper and lower extremity weakness. Weakness of right hip vigorous and ankle dorsiflexors. Coordination is impaired on the right normal on the left. Sensation appears preserved bilaterally. Deep tendon reflexes are 2+ symmetric. Plantars are downgoing. Gait was not tested. ASSESSMENT/PLAN [name] is a 54 y.o. male with no significant past medical history presenting with right sided weakness. He did not receive IV t-PA due to delay in arrival.   Stroke:  Dominant left PLIC infarct secondary to small vessel disease source  Resultant  Right hemiparesis  MRI  L PLIC infarct. Old lacunar infarcts  MRA  L M1 disease, widespread intracranial atherosclerosis  Carotid Doppler  No significant stenosis   2D Echo  No source of embolus   LDL 133  HgbA1c pending  SCDs for VTE prophylaxis Diet Heart Room service appropriate?: Yes; Fluid consistency:: Thin  No antithrombotic prior to admission, now on aspirin 325 mg daily  Patient counseled to be compliant with his antithrombotic medications  Ongoing aggressive stroke risk factor management  Therapy recommendations:  Rehab Disposition:  Rehab Hypertension  Stable Permissive hypertension (OK if < 220/120) but gradually normalize in 5-7 days  Hyperlipidemia  Home meds:  No statin  LDL 133, goal < 70  Added lipitor 40 mg daily  Continue statin at discharge  Other Stroke Risk Factors  THC use, UDS positive this admission  Cigarette smoker, advised to stop smoking  ETOH use  Hospital day # [facility] See Amion for Pager information 01/18/2015 3:59 PM  I have personally examined this patient, reviewed notes, independently viewed  imaging studies, participated in medical decision making and plan of care. I have made any additions or clarifications directly to the above note. Agree with note above. He presented with right-sided weakness secondary to left internal capsule infarct. MRI scan also shows previous silent infarcts as well as left middle cerebral artery occlusion.  I counseled the patient to quit smoking and to take aspirin and be compliant with his medical follow-up. He voiced understanding.Await rehab transfer in few days. Stroke team will sign off. Call for questions.  Antony Contras, MD Medical Director [facility] Pager: [phone] 01/18/2015 3:59 PM    To contact Stroke Continuity provider, please refer to [email or web address] After hours, contact General Neurology

## 2015-01-18 NOTE — Progress Notes (Signed)
PROGRESS NOTE  Michael Davenport [id number] DOB: 05/01/1960 DOA: 01/16/2015 PCP: No PCP Per Patient   HPI: Mr Michael Davenport is a pleasant 54 year old gentleman with history of hypertension, admitted to the medicine service on 01/16/2015 when he presented with complaints of right-sided weakness. He had not been on antiplatelet therapy prior to this presentation. MRI of brain showing acute left MCA territory infarct. Neurology was consulted.  Subjective / 24 H Interval events - feeling improved, still weak on right.   Assessment/Plan: Active Problems:   Alcohol abuse   Tobacco abuse   Acute CVA (cerebrovascular accident) ([place])   Dyslipidemia   HTN (hypertension)  Acute CVA. - [name] is a 54 year old gentleman with a history of alcohol abuse who presented to the emergency department with complaints of right-sided weakness. In the emergency department as how to be hypertensive. He had not been on antiplatelet therapy prior to this presentation. - Initial CT scan of brain not reveal acute intracranial abnormality. - Follow-up with MRI of brain did show acute left MCA territory infarct without hemorrhage. - Transthoracic echocardiogram showed an EF of 55-60% without wall motion abnormalities. - Carotid Dopplers revealed 1-39% ICA stenosis - Continue aspirin 325 mg by mouth daily and atorvastatin 40 mg by mouth daily  History of alcohol abuse. - Patient does not have evidence of acute alcohol withdrawal. - He was placed on CIWA protocol - determined to quit  Tobacco abuse - determined to quit  HTN - off of home antihypertensives for more than a year due to financial constraints. - permissive HTN for now in the setting of acute stroke, reintroduce antihypertensives as indicated 5-7 days  Dyslipidemia - LDL of 133 - Continue statin therapy   Diet: Diet Heart Room service appropriate?: Yes; Fluid consistency:: Thin Fluids: none DVT Prophylaxis: SCD  Code Status: Full  Code Family Communication: no family bedside  Disposition Plan: CIR vs SNF when ready   Barriers to discharge: CIR evaluating  Consultants:  Neurology   Procedures:  2D ehco    Antibiotics  Anti-infectives    None       Studies  Dg Chest 2 View  01/17/2015  CLINICAL DATA:  Right side weakness EXAM: CHEST  2 VIEW COMPARISON:  10/10/2003 FINDINGS: The heart size and mediastinal contours are within normal limits. Both lungs are clear. The visualized skeletal structures are unremarkable. IMPRESSION: No active cardiopulmonary disease. Electronically Signed   By: Rolm Baptise M.D.   On: 01/17/2015 10:52   Ct Head Wo Contrast  01/16/2015  CLINICAL DATA:  Numbness in right foot and arm EXAM: CT HEAD WITHOUT CONTRAST TECHNIQUE: Contiguous axial images were obtained from the base of the skull through the vertex without intravenous contrast. COMPARISON:  None. FINDINGS: Sinuses/Soft tissues: Clear paranasal sinuses and mastoid air cells. Intracranial: Cerebral and cerebellar atrophy which are age advanced. Mild to moderate low density in the periventricular white matter likely related to small vessel disease. No mass lesion, hemorrhage, hydrocephalus, acute infarct, intra-axial, or extra-axial fluid collection. IMPRESSION: 1.  No acute intracranial abnormality. 2.  Cerebral/cerebellar atrophy and small vessel ischemic change. Electronically Signed   By: Abigail Miyamoto M.D.   On: 01/16/2015 23:33   Mr Brain Wo Contrast  01/17/2015  CLINICAL DATA:  RIGHT-sided weakness which began 01/15/2015. History of hypertension with noncompliance. History of smoking. History of alcohol abuse. EXAM: MRI HEAD WITHOUT CONTRAST MRA HEAD WITHOUT CONTRAST TECHNIQUE: Multiplanar, multiecho pulse sequences of the brain and surrounding structures were obtained without intravenous contrast. Angiographic  images of the head were obtained using MRA technique without contrast. COMPARISON:  CT head 01/16/2015. FINDINGS: The  patient was unable to remain motionless for the exam. Small or subtle lesions could be overlooked. MRI HEAD FINDINGS There is a moderate-sized area of restricted diffusion affecting the posterior lentiform nucleus, internal capsule, and periventricular white matter on the LEFT consistent with an acute lenticulostriate infarct. No similar findings elsewhere. No hemorrhage, mass lesion, hydrocephalus, or extra-axial fluid. Premature for age atrophy. Chronic microvascular ischemic change affects the periventricular and subcortical white matter. Scattered areas of remote lacunar infarction involve the deep nuclei on the LEFT and the subcortical white matter, as well as the LEFT middle cerebellar peduncle. No midline abnormality. Flow voids are maintained in the internal carotid, basilar, and vertebral arteries. Absent flow related enhancement LEFT M1 MCA. Extracranial soft tissues are unremarkable. MRA HEAD FINDINGS The RIGHT internal carotid artery is widely patent in its cervical, petrous, cavernous, and supraclinoid segments. The LEFT ICA is widely patent in its cervical, petrous, and supraclinoid segments. 50% stenosis of the superior cavernous segment LICA. Basilar artery demonstrates focal stenosis proximally, estimated 50%. Both vertebrals contribute to formation of basilar, with mild irregularity on the RIGHT. Moderate stenosis on the LEFT distal most V4 vertebral segment estimated 50%. Moderately irregular RIGHT M1 MCA. Significant stenosis proximal to the bifurcation estimated at 75%. Proximal M2 outpouching suspected to represent a berry aneurysm, estimated 3 mm size, as seen on image 74 series 5. The study is too severely degraded from motion standpoint to confidently confirm, but CTA head recommended for further evaluation. Moderately diseased RIGHT A1 ACA throughout its course, with poor flow related enhancement in the RIGHT A2 and A3 segments. LEFT ICA terminates in robust A1 ACA which gives rise to a  normal sized LEFT anterior cerebral artery distally. There is no flow related enhancement in the RIGHT M1 MCA nor is there significant reconstitution in its distal ramifications. Given the acute LEFT MCA lenticulostriate territory infarct, the LEFT MCA occlusion is likely acute. Mildly irregular LEFT P1 PCA. Moderately irregular RIGHT P1 PCA with severe stenosis in its distal ambient segment on the RIGHT. Severely diseased BILATERAL superior cerebellar arteries. Poorly visualized anterior inferior and posterior inferior cerebellar arteries. IMPRESSION: Acute LEFT MCA lenticulostriate territory infarct without hemorrhage. LEFT M1 MCA large vessel occlusion. No significant recanalization of the LEFT M2 or M3 branches. Widespread intracranial atherosclerotic change as described, with potentially flow reducing lesions affecting the distal RIGHT MCA, proximal RIGHT ACA, distal RIGHT PCA, and tandem non stenotic lesions affecting the LEFT vertebral and proximal basilar. 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 Consider further assessment with CTA head due to motion degraded series. Electronically Signed   By: Staci Righter M.D.   On: 01/17/2015 10:50   Mr Jodene Nam Head/brain Wo Cm  01/17/2015  CLINICAL DATA:  RIGHT-sided weakness which began 01/15/2015. History of hypertension with noncompliance. History of smoking. History of alcohol abuse. EXAM: MRI HEAD WITHOUT CONTRAST MRA HEAD WITHOUT CONTRAST TECHNIQUE: Multiplanar, multiecho pulse sequences of the brain and surrounding structures were obtained without intravenous contrast. Angiographic images of the head were obtained using MRA technique without contrast. COMPARISON:  CT head 01/16/2015. FINDINGS: The patient was unable to remain motionless for the exam. Small or subtle lesions could be overlooked. MRI HEAD FINDINGS There is a moderate-sized area of restricted diffusion affecting the posterior lentiform nucleus, internal capsule, and periventricular  white matter on the LEFT consistent with an acute lenticulostriate infarct. No similar findings  elsewhere. No hemorrhage, mass lesion, hydrocephalus, or extra-axial fluid. Premature for age atrophy. Chronic microvascular ischemic change affects the periventricular and subcortical white matter. Scattered areas of remote lacunar infarction involve the deep nuclei on the LEFT and the subcortical white matter, as well as the LEFT middle cerebellar peduncle. No midline abnormality. Flow voids are maintained in the internal carotid, basilar, and vertebral arteries. Absent flow related enhancement LEFT M1 MCA. Extracranial soft tissues are unremarkable. MRA HEAD FINDINGS The RIGHT internal carotid artery is widely patent in its cervical, petrous, cavernous, and supraclinoid segments. The LEFT ICA is widely patent in its cervical, petrous, and supraclinoid segments. 50% stenosis of the superior cavernous segment LICA. Basilar artery demonstrates focal stenosis proximally, estimated 50%. Both vertebrals contribute to formation of basilar, with mild irregularity on the RIGHT. Moderate stenosis on the LEFT distal most V4 vertebral segment estimated 50%. Moderately irregular RIGHT M1 MCA. Significant stenosis proximal to the bifurcation estimated at 75%. Proximal M2 outpouching suspected to represent a berry aneurysm, estimated 3 mm size, as seen on image 74 series 5. The study is too severely degraded from motion standpoint to confidently confirm, but CTA head recommended for further evaluation. Moderately diseased RIGHT A1 ACA throughout its course, with poor flow related enhancement in the RIGHT A2 and A3 segments. LEFT ICA terminates in robust A1 ACA which gives rise to a normal sized LEFT anterior cerebral artery distally. There is no flow related enhancement in the RIGHT M1 MCA nor is there significant reconstitution in its distal ramifications. Given the acute LEFT MCA lenticulostriate territory infarct, the LEFT MCA  occlusion is likely acute. Mildly irregular LEFT P1 PCA. Moderately irregular RIGHT P1 PCA with severe stenosis in its distal ambient segment on the RIGHT. Severely diseased BILATERAL superior cerebellar arteries. Poorly visualized anterior inferior and posterior inferior cerebellar arteries. IMPRESSION: Acute LEFT MCA lenticulostriate territory infarct without hemorrhage. LEFT M1 MCA large vessel occlusion. No significant recanalization of the LEFT M2 or M3 branches. Widespread intracranial atherosclerotic change as described, with potentially flow reducing lesions affecting the distal RIGHT MCA, proximal RIGHT ACA, distal RIGHT PCA, and tandem non stenotic lesions affecting the LEFT vertebral and proximal basilar. 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 Consider further assessment with CTA head due to motion degraded series. Electronically Signed   By: Staci Righter M.D.   On: 01/17/2015 10:50    Objective  Filed Vitals:   01/18/15 0217 01/18/15 0506 01/18/15 1026 01/18/15 1449  BP: 151/85 160/89 190/98 174/101  Pulse: 65 80 98 68  Temp: 98.2 F (36.8 C) 97.9 F (36.6 C) 98.7 F (37.1 C) 98.1 F (36.7 C)  TempSrc: Oral Oral Oral Oral  Resp: 18 18 18 18   Height:      Weight:      SpO2: 97% 97% 98% 99%    Intake/Output Summary (Last 24 hours) at 01/18/15 1551 Last data filed at 01/17/15 2125  Gross per 24 hour  Intake      0 ml  Output   1000 ml  Net  -1000 ml   Filed Weights   01/17/15 0319  Weight: 78.336 kg (172 lb 11.2 oz)    Exam:  GENERAL: NAD  HEENT: no scleral icterus, PERRL  NECK: supple, no LAD  LUNGS: CTA biL, no wheezing  HEART: RRR without MRG  ABDOMEN: soft, non tender  EXTREMITIES: no clubbing / cyanosis  NEUROLOGIC: right upper and lowe extremity weakness   PSYCHIATRIC: normal mood and affect  Data Reviewed: Basic  Metabolic Panel:  Recent Labs Lab 01/16/15 2204  NA 137  K 3.5  CL 103  CO2 23  GLUCOSE 98  BUN 12  CREATININE  1.11  CALCIUM 9.3   Liver Function Tests: No results for input(s): AST, ALT, ALKPHOS, BILITOT, PROT, ALBUMIN in the last 168 hours. No results for input(s): LIPASE, AMYLASE in the last 168 hours. No results for input(s): AMMONIA in the last 168 hours. CBC:  Recent Labs Lab 01/16/15 2204  WBC 11.7*  HGB 16.5  HCT 47.3  MCV 89.1  PLT 284   Cardiac Enzymes:  Recent Labs Lab 01/17/15 0750  TROPONINI <0.03   BNP (last 3 results) No results for input(s): BNP in the last 8760 hours.  ProBNP (last 3 results) No results for input(s): PROBNP in the last 8760 hours.  CBG:  Recent Labs Lab 01/16/15 2219  GLUCAP 84    No results found for this or any previous visit (from the past 240 hour(s)).   Scheduled Meds: . aspirin  300 mg Rectal Daily   Or  . aspirin  325 mg Oral Daily  . atorvastatin  40 mg Oral q1800  . folic acid  1 mg Oral Daily  . LORazepam  0-4 mg Oral 4 times per day   Followed by  . [START ON 01/19/2015] LORazepam  0-4 mg Oral Q12H  . multivitamin with minerals  1 tablet Oral Daily  . nicotine  21 mg Transdermal Daily  . thiamine  100 mg Oral Daily   Or  . thiamine  100 mg Intravenous Daily   Continuous Infusions:    Marzetta Board, MD [facility] Pager [phone]. If 7 PM - 7 AM, please contact night-coverage at [email or web address], password [facility] 01/18/2015, 3:51 PM  LOS: 1 day

## 2015-01-18 NOTE — Progress Notes (Addendum)
Physical Therapy Evaluation Patient Details Name: Michael Davenport MRN: [id number] DOB: Sep 08, 1960 Today's Date: 01/18/2015   History of Present Illness  54 y.o. male admitted for progressing R side weakness. MRI + for L MCA infarct  Clinical Impression  Pt admitted with the above diagnosis. Pt currently with functional limitations due to the deficits listed below (see PT Problem List). Previously independent PTA. Demonstrates tone and significant Rt sided weakness. Good knee stability on Rt, but more distally demonstrating notable ankle inversion with gait and difficulty coordinating foot placement. Close guard with ambulation for 60 foot bout but had one episodes of loss of balance to the right, requiring mod assist to prevent fall. Eager to improve his functional abilities. Pt will benefit from skilled PT to increase their independence and safety with mobility to allow discharge to the venue listed below.       Follow Up Recommendations CIR    Equipment Recommendations   (TBD next venue of care)    Recommendations for Other Services Rehab consult     Precautions / Restrictions Precautions Precautions: Fall Restrictions Weight Bearing Restrictions: No      Mobility  Bed Mobility               General bed mobility comments: in recliner  Transfers Overall transfer level: Needs assistance Equipment used: Rolling walker (2 wheeled) Transfers: Sit to/from Stand Sit to Stand: Min assist         General transfer comment: Min assist for balance, encouraged to place Rt and Lt hands over Rt knee to increase weight-bearing and prevent compensation with LLE. Performed x2 from recliner.  Ambulation/Gait Ambulation/Gait assistance: Mod assist Ambulation Distance (Feet): 60 Feet Assistive device: Rolling walker (2 wheeled) Gait Pattern/deviations: Step-to pattern;Decreased step length - left;Decreased stance time - right;Decreased dorsiflexion - right;Decreased stride  length;Decreased weight shift to right;Trunk flexed;Ataxic (Rt ankle inversion) Gait velocity: slow Gait velocity interpretation: <1.8 ft/sec, indicative of risk for recurrent falls General Gait Details: Poor placement of RLE, lack of heel strike, walking on lateral border. VC for awareness and attempt to correct as able. majority of bout at close guard for safety but has one episode of loss of balance to right requiring mod assist to prevent fall. Rt knee without significant instability (buckling/hyperextension.)   Stairs            Wheelchair Mobility    Modified Rankin (Stroke Patients Only) Modified Rankin (Stroke Patients Only) Pre-Morbid Rankin Score: No symptoms Modified Rankin: Moderately severe disability     Balance Overall balance assessment: Needs assistance Sitting-balance support: No upper extremity supported Sitting balance-Leahy Scale: Good     Standing balance support: Single extremity supported Standing balance-Leahy Scale: Poor                               Pertinent Vitals/Pain Pain Assessment: No/denies pain    Home Living Family/patient expects to be discharged to:: Private residence Living Arrangements: Non-relatives/Friends (Friend) Available Help at Discharge: Friend(s);Available PRN/intermittently Type of Home: House Home Access: Level entry     Home Layout: One level Home Equipment: Walker - 2 wheels Additional Comments: Pt's friend may have BSC and shower seat, but pt is unsure.    Prior Function Level of Independence: Independent         Comments: Loves to garden, not working or driving     Hand Dominance   Dominant Hand: Right    Extremity/Trunk Assessment  Upper Extremity Assessment: Defer to OT evaluation             RLE Deficits / Details: Rt knee extension 4/5, hip extension 4/5, dorsiflexion 3+/5 but coupled with inversion, Eversion, 2/5, hallux extension 1/5       Communication   Communication:  No difficulties  Cognition Arousal/Alertness: Awake/alert Behavior During Therapy: WFL for tasks assessed/performed Overall Cognitive Status: Within Functional Limits for tasks assessed                      General Comments      Exercises General Exercises - Lower Extremity Ankle Circles/Pumps: AAROM;Both;10 reps;Seated Long Arc Quad: AROM;Right;10 reps;Seated      Assessment/Plan    PT Assessment Patient needs continued PT services  PT Diagnosis Difficulty walking;Abnormality of gait;Hemiplegia dominant side   PT Problem List Decreased strength;Decreased range of motion;Decreased activity tolerance;Decreased balance;Decreased mobility;Decreased coordination;Decreased knowledge of use of DME;Impaired tone  PT Treatment Interventions DME instruction;Gait training;Functional mobility training;Therapeutic activities;Therapeutic exercise;Balance training;Neuromuscular re-education;Patient/family education   PT Goals (Current goals can be found in the Care Plan section) Acute Rehab PT Goals Patient Stated Goal: to get back to normal so he can return to gardening in March PT Goal Formulation: With patient Time For Goal Achievement: 02/01/15 Potential to Achieve Goals: Good    Frequency Min 4X/week   Barriers to discharge Decreased caregiver support      Co-evaluation               End of Session Equipment Utilized During Treatment: Gait belt Activity Tolerance: Patient tolerated treatment well Patient left: in chair;with call bell/phone within reach;with chair alarm set Nurse Communication: Mobility status         Time: [id number] PT Time Calculation (min) (ACUTE ONLY): 16 min   Charges:   PT Evaluation $Initial PT Evaluation Tier I: 1 Procedure     PT G CodesEllouise Newer 01/18/2015, 1:17 PM  Elayne Snare, [place]   Addendum for rankin scores

## 2015-01-18 NOTE — Progress Notes (Signed)
Rehab Admissions Coordinator Note:  Patient was screened by Retta Diones for appropriateness for an Inpatient Acute Rehab Consult.  At this time, we are recommending Inpatient Rehab consult.  Retta Diones 01/18/2015, 1:33 PM  I can be reached at [phone].

## 2015-01-18 NOTE — Consult Note (Signed)
Physical Medicine and Rehabilitation Consult  Reason for Consult: Right sided weakness Referring Physician: Dr. Renne Crigler   HPI: Michael Davenport is a 54 y.o. RH-male with history of HTN, ETOH abuse, medical non-compliance who was admitted on 01/16/15 with new onset of right sided weakness and falls. UDS positive for ETOH and THC. 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  2D echo with EF 55-60% and no wall abnormality.  Carotid dopplers without significant ICA stenosis.  Dr. [name] consulted and recommended ASA [id number] mg for left PLIC infarct due to small vessel disease and aggressive risk factor management. PT/OT evaluations done today and patient limited by right sided weakness with decrease in [facility] affecting ability to carry out ADLs as well as mobility. CIR recommended for follow up therapy.  Pt does not have 24/7 support on discharge.   Review of Systems  HENT: Negative for hearing loss.   Eyes: Negative for blurred vision and double vision.  Respiratory: Negative for cough, sputum production and shortness of breath.   Cardiovascular: Negative for chest pain and palpitations.  Gastrointestinal: Negative for heartburn, nausea, abdominal pain and constipation.  Genitourinary: Negative for dysuria and urgency.  Musculoskeletal: Positive for back pain. Negative for myalgias.  Neurological: Positive for focal weakness. Negative for dizziness, sensory change, speech change and headaches.  Psychiatric/Behavioral: Negative for memory loss. The patient does not have insomnia.   All other systems reviewed and are negative.  Past Medical History  Diagnosis Date  . HTN (hypertension)     History reviewed. No pertinent past surgical history.   Family History  Problem Relation Age of  Onset  . Hypertension Other   . Hypertension Brother     Social History:  Lives with friends. 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  Does not drive. He reports that he has been smoking about 1 PPD.  He does not have any smokeless tobacco history on file.  He reports that he drinks 6-8 beers daily and occasional liquor.  He reports that he uses marijuana.    Allergies: No Known Allergies    No prescriptions prior to admission    Home: Home Living Family/patient expects to be discharged to:: Private residence Living Arrangements: Non-relatives/Friends (Friend) Available Help at Discharge: Friend(s), Available PRN/intermittently Type of Home: House Home Access: Level entry Home Layout: One level Bathroom Shower/Tub: Chiropodist: Standard Home Equipment: Environmental consultant - 2 wheels Additional Comments: Pt's friend may have BSC and shower seat, but pt is unsure.  Functional History: Prior Function Level of Independence: Independent Comments: Loves to garden, not working or driving Functional Status:  Mobility: Bed Mobility General bed mobility comments: in recliner Transfers Overall transfer level: Needs assistance Equipment used: Rolling walker (2 wheeled) Transfers: Sit to/from Stand Sit to Stand: Min assist General transfer comment: Min assist for balance, encouraged to place Rt and Lt hands over Rt knee to increase weight-bearing and prevent compensation with LLE. Performed x2 from recliner. Ambulation/Gait Ambulation/Gait assistance: Mod assist Ambulation Distance (Feet): 60 Feet Assistive device: Rolling walker (2 wheeled) Gait Pattern/deviations: Step-to pattern, Decreased step length - left, Decreased stance time - right, Decreased dorsiflexion - right, Decreased stride length, Decreased weight shift to right, Trunk flexed, Ataxic (Rt ankle inversion) General Gait Details: Poor placement of  RLE, lack of heel strike, walking on lateral border. VC for awareness and attempt to correct as able. majority of bout at close guard for safety but has one episode of loss of balance to right requiring mod assist to prevent fall. Rt knee without significant instability (buckling/hyperextension.)  Gait velocity: slow Gait velocity interpretation: <1.8 ft/sec, indicative of risk for recurrent falls    ADL: ADL Overall ADL's : Needs assistance/impaired Grooming: Wash/dry hands, Wash/dry face, Oral care, Minimal assistance, Cueing for safety, Standing Grooming Details (indicate cue type and reason): Verbal cues to incorporate RUE during all tasks and for awareness of positioning of R leg Toilet Transfer: Minimal assistance, Cueing for safety, Ambulation, Regular Toilet, Grab bars, RW Toilet Transfer Details (indicate cue type and reason): Verbal cues for safe hand placement before sitting. Toileting- Clothing Manipulation and Hygiene: Minimal assistance, Cueing for safety, Sit to/from stand Toileting - Clothing Manipulation Details (indicate cue type and reason): Verbal cues for safe R hand placement and to stand up stall on R leg before pulling up underwear Functional mobility during ADLs: Minimal assistance, Rolling walker General ADL Comments: Pt very motivated and responsive to encouragement to use RUE during all self-care tasks despite weakness and decreased fine motor coordination. Pt's HR increased to 120-125 during self-care tasks at sink and returned to <90 quickly after sitting EOB for 30 seconds. Verbal cues throughout for safe use and hand placement while using RW. Provided pt with HEP with squeeze ball and theraputty - pt immediately began using and demonstrated understanding of exercises.   Cognition: Cognition Overall Cognitive Status: Within Functional Limits for tasks assessed Orientation Level: Oriented X4 Cognition Arousal/Alertness: Awake/alert Behavior During Therapy: WFL for  tasks assessed/performed Overall Cognitive Status: Within Functional Limits for tasks assessed  Blood pressure 174/101, pulse 68, temperature 98.1 F (36.7 C), temperature source Oral, resp. rate 18, height 5\' 6"  (1.676 m), weight 78.336 kg (172 lb 11.2 oz), SpO2 99 %. Physical Exam  Nursing note and vitals reviewed. Constitutional: He is oriented to person, place, and time. He appears well-developed and well-nourished.  HENT:  Head: Normocephalic and atraumatic.  Mouth/Throat: Oropharynx is clear and moist.  Eyes: Conjunctivae and EOM are normal. Pupils are equal, round, and reactive to light. Right eye exhibits no discharge. Left eye exhibits no discharge.  Neck: Normal range of motion. Neck supple.  Cardiovascular: Normal rate and regular rhythm.   No murmur heard. Respiratory: Effort normal and breath sounds normal. No respiratory distress. He has no wheezes.  GI: Soft. Bowel sounds are normal. He exhibits no distension. There is no tenderness.  Musculoskeletal: He exhibits no edema or tenderness.  PROM WNL  Neurological: He is alert and oriented to person, place, and time.  Speech clear.  Able to follow one and two step commands without difficulty.  RUE: Dysmetria, ataxia, dysdiadochokinesia 4/5, proximal to distal RLE: ataxia, 4/5 proximal to distal,  LUE/LLE: 5/5 proximal to distal 3+ DTRs RUE/RLE Sensation intact to light touch  Skin: Skin is warm and dry.  Psychiatric: He has a normal mood and affect. His behavior is normal. Thought content normal.   No results found for this or any previous visit (from the past 24 hour(s)). Dg Chest 2 View  01/17/2015  CLINICAL DATA:  Right side weakness EXAM: CHEST  2 VIEW COMPARISON:  10/10/2003 FINDINGS: The heart size and mediastinal contours are within normal limits. Both lungs are clear. The visualized skeletal structures are unremarkable. IMPRESSION: No active cardiopulmonary disease. Electronically Signed   By:  Rolm Baptise M.D.    On: 01/17/2015 10:52   Ct Head Wo Contrast  01/16/2015  CLINICAL DATA:  Numbness in right foot and arm EXAM: CT HEAD WITHOUT CONTRAST TECHNIQUE: Contiguous axial images were obtained from the base of the skull through the vertex without intravenous contrast. COMPARISON:  None. FINDINGS: Sinuses/Soft tissues: Clear paranasal sinuses and mastoid air cells. Intracranial: Cerebral and cerebellar atrophy which are age advanced. Mild to moderate low density in the periventricular white matter likely related to small vessel disease. No mass lesion, hemorrhage, hydrocephalus, acute infarct, intra-axial, or extra-axial fluid collection. IMPRESSION: 1.  No acute intracranial abnormality. 2.  Cerebral/cerebellar atrophy and small vessel ischemic change. Electronically Signed   By: Abigail Miyamoto M.D.   On: 01/16/2015 23:33   Mr Brain Wo Contrast  01/17/2015  CLINICAL DATA:  RIGHT-sided weakness which began 01/15/2015. History of hypertension with noncompliance. History of smoking. History of alcohol abuse. EXAM: MRI HEAD WITHOUT CONTRAST MRA HEAD WITHOUT CONTRAST TECHNIQUE: Multiplanar, multiecho pulse sequences of the brain and surrounding structures were obtained without intravenous contrast. Angiographic images of the head were obtained using MRA technique without contrast. COMPARISON:  CT head 01/16/2015. FINDINGS: The patient was unable to remain motionless for the exam. Small or subtle lesions could be overlooked. MRI HEAD FINDINGS There is a moderate-sized area of restricted diffusion affecting the posterior lentiform nucleus, internal capsule, and periventricular white matter on the LEFT consistent with an acute lenticulostriate infarct. No similar findings elsewhere. No hemorrhage, mass lesion, hydrocephalus, or extra-axial fluid. Premature for age atrophy. Chronic microvascular ischemic change affects the periventricular and subcortical white matter. Scattered areas of remote lacunar infarction involve the deep  nuclei on the LEFT and the subcortical white matter, as well as the LEFT middle cerebellar peduncle. No midline abnormality. Flow voids are maintained in the internal carotid, basilar, and vertebral arteries. Absent flow related enhancement LEFT M1 MCA. Extracranial soft tissues are unremarkable. MRA HEAD FINDINGS The RIGHT internal carotid artery is widely patent in its cervical, petrous, cavernous, and supraclinoid segments. The LEFT ICA is widely patent in its cervical, petrous, and supraclinoid segments. 50% stenosis of the superior cavernous segment LICA. Basilar artery demonstrates focal stenosis proximally, estimated 50%. Both vertebrals contribute to formation of basilar, with mild irregularity on the RIGHT. Moderate stenosis on the LEFT distal most V4 vertebral segment estimated 50%. Moderately irregular RIGHT M1 MCA. Significant stenosis proximal to the bifurcation estimated at 75%. Proximal M2 outpouching suspected to represent a berry aneurysm, estimated 3 mm size, as seen on image 74 series 5. The study is too severely degraded from motion standpoint to confidently confirm, but CTA head recommended for further evaluation. Moderately diseased RIGHT A1 ACA throughout its course, with poor flow related enhancement in the RIGHT A2 and A3 segments. LEFT ICA terminates in robust A1 ACA which gives rise to a normal sized LEFT anterior cerebral artery distally. There is no flow related enhancement in the RIGHT M1 MCA nor is there significant reconstitution in its distal ramifications. Given the acute LEFT MCA lenticulostriate territory infarct, the LEFT MCA occlusion is likely acute. Mildly irregular LEFT P1 PCA. Moderately irregular RIGHT P1 PCA with severe stenosis in its distal ambient segment on the RIGHT. Severely diseased BILATERAL superior cerebellar arteries. Poorly visualized anterior inferior and posterior inferior cerebellar arteries. IMPRESSION: Acute LEFT MCA lenticulostriate territory infarct  without hemorrhage. LEFT M1 MCA large vessel occlusion. No significant recanalization of the LEFT M2 or M3 branches. Widespread intracranial atherosclerotic change as described,  with potentially flow reducing lesions affecting the distal RIGHT MCA, proximal RIGHT ACA, distal RIGHT PCA, and tandem non stenotic lesions affecting the LEFT vertebral and proximal basilar. 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 Consider further assessment with CTA head due to motion degraded series. Electronically Signed   By: Staci Righter M.D.   On: 01/17/2015 10:50    Assessment/Plan: Diagnosis: L-MCA lenticulostriate territory infarct  Labs and images independently reviewed.  Records reviewed and summated above. Stroke: Continue secondary stroke prophylaxis and Risk Factor Modification listed below:   Antiplatelet therapy Blood Pressure Management:  Continue current medication with prn's with permisive HTN per primary team Statin Agent Diabetes management Tobacco abuse:  Cont to counsel  Motor recovery: Fluoxetine  1. Does the need for close, 24 hr/day medical supervision in concert with the patient's rehab needs make it unreasonable for this patient to be served in a less intensive setting? Yes  2. Co-Morbidities requiring supervision/potential complications: HTN (monitor and provide prns in accordance with increased physical exertion and pain), ETOH abuse (CIWA, cont to monitor for withdrawal), medical non-compliance (cont to educate), THC abuse (cont to counsel), tobacco abuse (cont to counsel) 3. Due to safety, disease management, medication administration and patient education, does the patient require 24 hr/day rehab nursing? Yes 4. Does the patient require coordinated care of a physician, rehab nurse, PT (1-2 hrs/day, 5 days/week) and OT (1-2 hrs/day, 5 days/week) to address physical and functional deficits in the context of the above medical diagnosis(es)? Yes Addressing deficits in the  following areas: balance, endurance, locomotion, strength, transferring, bathing, feeding, grooming, toileting and psychosocial support 5. Can the patient actively participate in an intensive therapy program of at least 3 hrs of therapy per day at least 5 days per week? Yes 6. The potential for patient to make measurable gains while on inpatient rehab is excellent 7. Anticipated functional outcomes upon discharge from inpatient rehab are modified independent  with PT, modified independent with OT, n/a with SLP. 8. Estimated rehab length of stay to reach the above functional goals is: 19-22 days. 9. Does the patient have adequate social supports and living environment to accommodate these discharge functional goals? Yes 10. Anticipated D/C setting: Home 11. Anticipated post D/C treatments: HH therapy and Home excercise program 12. Overall Rehab/Functional Prognosis: good  RECOMMENDATIONS: This patient's condition is appropriate for continued rehabilitative care in the following setting: CIR Patient has agreed to participate in recommended program. Yes Note that insurance prior authorization may be required for reimbursement for recommended care.  Comment: Rehab Admissions Coordinator to follow up.  Delice Lesch, MD 01/18/2015

## 2015-01-19 ENCOUNTER — Inpatient Hospital Stay (HOSPITAL_COMMUNITY)
Admission: AD | Admit: 2015-01-19 | Discharge: 2015-01-25 | DRG: 057 | Disposition: A | Discharge status: Home / Self Care | Payer: Medicaid Other | Source: Intra-hospital | Attending: Physical Medicine & Rehabilitation | Admitting: Physical Medicine & Rehabilitation

## 2015-01-19 ENCOUNTER — Encounter (HOSPITAL_COMMUNITY): Payer: Self-pay | Admitting: *Deleted

## 2015-01-19 DIAGNOSIS — E785 Hyperlipidemia, unspecified: Secondary | ICD-10-CM

## 2015-01-19 DIAGNOSIS — F1210 Cannabis abuse, uncomplicated: Secondary | ICD-10-CM | POA: Diagnosis not present

## 2015-01-19 DIAGNOSIS — F17200 Nicotine dependence, unspecified, uncomplicated: Secondary | ICD-10-CM | POA: Diagnosis present

## 2015-01-19 DIAGNOSIS — I63411 Cerebral infarction due to embolism of right middle cerebral artery: Secondary | ICD-10-CM

## 2015-01-19 DIAGNOSIS — F17210 Nicotine dependence, cigarettes, uncomplicated: Secondary | ICD-10-CM | POA: Diagnosis not present

## 2015-01-19 DIAGNOSIS — F1010 Alcohol abuse, uncomplicated: Secondary | ICD-10-CM | POA: Diagnosis not present

## 2015-01-19 DIAGNOSIS — IMO0001 Reserved for inherently not codable concepts without codable children: Secondary | ICD-10-CM | POA: Diagnosis present

## 2015-01-19 DIAGNOSIS — G8191 Hemiplegia, unspecified affecting right dominant side: Secondary | ICD-10-CM | POA: Diagnosis not present

## 2015-01-19 DIAGNOSIS — I10 Essential (primary) hypertension: Secondary | ICD-10-CM

## 2015-01-19 DIAGNOSIS — D72829 Elevated white blood cell count, unspecified: Secondary | ICD-10-CM | POA: Diagnosis not present

## 2015-01-19 DIAGNOSIS — I69351 Hemiplegia and hemiparesis following cerebral infarction affecting right dominant side: Principal | ICD-10-CM

## 2015-01-19 DIAGNOSIS — Z9181 History of falling: Secondary | ICD-10-CM | POA: Diagnosis not present

## 2015-01-19 DIAGNOSIS — Z9119 Patient's noncompliance with other medical treatment and regimen: Secondary | ICD-10-CM | POA: Diagnosis not present

## 2015-01-19 DIAGNOSIS — R030 Elevated blood-pressure reading, without diagnosis of hypertension: Secondary | ICD-10-CM | POA: Diagnosis not present

## 2015-01-19 HISTORY — DX: Alcohol abuse, uncomplicated: F10.10

## 2015-01-19 MED ORDER — ASPIRIN EC 325 MG PO TBEC
325.0000 mg | DELAYED_RELEASE_TABLET | Freq: Every day | ORAL | Status: DC
  Administered 2015-01-20 – 2015-01-25 (×6): 325 mg via ORAL
  Filled 2015-01-19 (×6): qty 1

## 2015-01-19 MED ORDER — LISINOPRIL 2.5 MG PO TABS
2.5000 mg | ORAL_TABLET | Freq: Every day | ORAL | Status: DC
  Administered 2015-01-19 – 2015-01-24 (×6): 2.5 mg via ORAL
  Filled 2015-01-19 (×5): qty 1

## 2015-01-19 MED ORDER — ENOXAPARIN SODIUM 40 MG/0.4ML ~~LOC~~ SOLN
40.0000 mg | SUBCUTANEOUS | Status: DC
Start: 2015-01-19 — End: 2015-01-25
  Administered 2015-01-19 – 2015-01-24 (×5): 40 mg via SUBCUTANEOUS
  Filled 2015-01-19 (×6): qty 0.4

## 2015-01-19 MED ORDER — LORAZEPAM 1 MG PO TABS: 1.0000 mg | ORAL_TABLET | Freq: Four times a day (QID) | ORAL | Status: AC | PRN

## 2015-01-19 MED ORDER — BISACODYL 10 MG RE SUPP: 10.0000 mg | Freq: Every day | RECTAL | Status: DC | PRN

## 2015-01-19 MED ORDER — ASPIRIN 325 MG PO TABS: 325.0000 mg | ORAL_TABLET | Freq: Every day | ORAL | Status: DC

## 2015-01-19 MED ORDER — LORAZEPAM 2 MG/ML IJ SOLN: 1.0000 mg | Freq: Four times a day (QID) | INTRAMUSCULAR | Status: AC | PRN

## 2015-01-19 MED ORDER — ACETAMINOPHEN 325 MG PO TABS
325.0000 mg | ORAL_TABLET | ORAL | Status: DC | PRN
  Administered 2015-01-19 – 2015-01-20 (×2): 650 mg via ORAL
  Filled 2015-01-19 (×2): qty 2

## 2015-01-19 MED ORDER — DIPHENHYDRAMINE HCL 12.5 MG/5ML PO ELIX: 12.5000 mg | ORAL_SOLUTION | Freq: Four times a day (QID) | ORAL | Status: DC | PRN

## 2015-01-19 MED ORDER — ALUM & MAG HYDROXIDE-SIMETH 200-200-20 MG/5ML PO SUSP: 30.0000 mL | ORAL | Status: DC | PRN

## 2015-01-19 MED ORDER — LISINOPRIL 2.5 MG PO TABS
2.5000 mg | ORAL_TABLET | Freq: Every day | ORAL | Status: DC
  Filled 2015-01-19: qty 1

## 2015-01-19 MED ORDER — ATORVASTATIN CALCIUM 40 MG PO TABS
40.0000 mg | ORAL_TABLET | Freq: Every day | ORAL | Status: DC
  Administered 2015-01-19 – 2015-01-24 (×6): 40 mg via ORAL
  Filled 2015-01-19 (×6): qty 1

## 2015-01-19 MED ORDER — LISINOPRIL 2.5 MG PO TABS: 2.5000 mg | ORAL_TABLET | Freq: Every day | ORAL | Status: DC

## 2015-01-19 MED ORDER — SENNOSIDES-DOCUSATE SODIUM 8.6-50 MG PO TABS: 2.0000 | ORAL_TABLET | Freq: Every evening | ORAL | Status: DC | PRN

## 2015-01-19 MED ORDER — LORAZEPAM 1 MG PO TABS
0.0000 mg | ORAL_TABLET | Freq: Two times a day (BID) | ORAL | Status: AC
Start: 2015-01-19 — End: 2015-01-21
  Administered 2015-01-20: 1 mg via ORAL
  Filled 2015-01-19: qty 1

## 2015-01-19 MED ORDER — FLEET ENEMA 7-19 GM/118ML RE ENEM: 1.0000 | ENEMA | Freq: Once | RECTAL | Status: DC | PRN

## 2015-01-19 MED ORDER — VITAMIN B-1 100 MG PO TABS
100.0000 mg | ORAL_TABLET | Freq: Every day | ORAL | Status: DC
  Administered 2015-01-20 – 2015-01-25 (×6): 100 mg via ORAL
  Filled 2015-01-19 (×6): qty 1

## 2015-01-19 MED ORDER — PROCHLORPERAZINE 25 MG RE SUPP: 12.5000 mg | Freq: Four times a day (QID) | RECTAL | Status: DC | PRN

## 2015-01-19 MED ORDER — INFLUENZA VAC SPLIT QUAD 0.5 ML IM SUSY
0.5000 mL | PREFILLED_SYRINGE | INTRAMUSCULAR | Status: AC
  Administered 2015-01-20: 0.5 mL via INTRAMUSCULAR
  Filled 2015-01-19: qty 0.5

## 2015-01-19 MED ORDER — ADULT MULTIVITAMIN W/MINERALS CH
1.0000 | ORAL_TABLET | Freq: Every day | ORAL | Status: DC
  Administered 2015-01-20 – 2015-01-25 (×6): 1 via ORAL
  Filled 2015-01-19 (×6): qty 1

## 2015-01-19 MED ORDER — FOLIC ACID 1 MG PO TABS
1.0000 mg | ORAL_TABLET | Freq: Every day | ORAL | Status: DC
  Administered 2015-01-20 – 2015-01-25 (×6): 1 mg via ORAL
  Filled 2015-01-19 (×6): qty 1

## 2015-01-19 MED ORDER — TRAZODONE HCL 50 MG PO TABS: 25.0000 mg | ORAL_TABLET | Freq: Every evening | ORAL | Status: DC | PRN

## 2015-01-19 MED ORDER — ATORVASTATIN CALCIUM 40 MG PO TABS: 40.0000 mg | ORAL_TABLET | Freq: Every day | ORAL | Status: DC

## 2015-01-19 MED ORDER — NICOTINE 21 MG/24HR TD PT24
21.0000 mg | MEDICATED_PATCH | Freq: Every day | TRANSDERMAL | Status: DC
  Administered 2015-01-20 – 2015-01-25 (×6): 21 mg via TRANSDERMAL
  Filled 2015-01-19 (×6): qty 1

## 2015-01-19 MED ORDER — PROCHLORPERAZINE MALEATE 5 MG PO TABS: 5.0000 mg | ORAL_TABLET | Freq: Four times a day (QID) | ORAL | Status: DC | PRN

## 2015-01-19 MED ORDER — GUAIFENESIN-DM 100-10 MG/5ML PO SYRP: 5.0000 mL | ORAL_SOLUTION | Freq: Four times a day (QID) | ORAL | Status: DC | PRN

## 2015-01-19 MED ORDER — PROCHLORPERAZINE EDISYLATE 5 MG/ML IJ SOLN: 5.0000 mg | Freq: Four times a day (QID) | INTRAMUSCULAR | Status: DC | PRN

## 2015-01-19 NOTE — Progress Notes (Addendum)
Received pt. As transfer from M.Pt. Has been oriented to the unit protocol,safety plan was explained,fall prevention plan was explained and signed.Welcome video was played.Keep assessing pt.'s needs closely. BP 169/100,Pam Love PAC has been notified.

## 2015-01-19 NOTE — Care Management Note (Signed)
Case Management Note  Patient Details  Name: [name] MRN: [id number] Date of Birth: May 20, 1960  Subjective/Objective:                    Action/Plan: CM met with patient to discuss discharge planning.  Patient will be discharging to CIR. He is self-pay and is being followed by [facility] in [place] [place].  At this time, CM is unable to make an appointment for the patient at the [facility] or address medication needs, as his discharge date from CIR is unclear.  CM provided written information on the [facility] and spoke with patient about requesting assistance from the [place] staff prior to discharge. Expected Discharge Date:                  Expected Discharge Plan:  [place]  In-House Referral:  Development worker, community, PCP / [facility]  Discharge planning Services  CM Consult  Post Acute Care Choice:    Choice offered to:     DME Arranged:    DME Agency:     HH Arranged:    [place] Agency:     Status of Service:  Completed, signed off  Medicare Important Message Given:    Date Medicare IM Given:    Medicare IM give by:    Date Additional Medicare IM Given:    Additional Medicare Important Message give by:     If discussed at [place] of Stay Meetings, dates discussed:    Additional Comments:  Rolm Baptise, RN 01/19/2015, 11:36 AM [phone]

## 2015-01-19 NOTE — Progress Notes (Signed)
PMR Admission Coordinator Pre-Admission Assessment  Patient: Michael Davenport is an 54 y.o., male MRN: [id number] DOB: [birth year] Height: 5\' 6"  (167.6 cm) Weight: 78.336 kg (172 lb 11.2 oz)  Insurance Information HMO: PPO: PCP: IPA: 80/20: OTHER:  PRIMARY: Uninsured   Medicaid Application Date: Case Manager:  Disability Application Date: Case Worker:  I contacted [name], Development worker, community, to request disability and Medicaid applications to be initiated 01/19/15.  Emergency Contact Information Contact Information    Name Relation Home Work [place] Friend [phone]  [phone]   [place], [place] [id number]       Current Medical History  Patient Admitting Diagnosis: L MCA cva  History of Present Illness: Michael Davenport is a 54 y.o. RH-male with history of HTN, ETOH abuse, medical non-compliance who was admitted on 01/16/15 with new onset of right sided weakness and falls. UDS positive for ETOH and THC. 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 2D echo with EF 55-60% and no wall abnormality. Carotid dopplers without significant ICA stenosis. Dr. [name] consulted and recommended ASA [id number] mg for left PLIC infarct due to small vessel disease and aggressive risk factor management. PT/OT evaluations completed and patient limited by right sided weakness with decrease in [facility] affecting ability to carry out ADLs as well as mobility.  Total: 4 NIH    Past Medical History  Past Medical History  Diagnosis Date  . HTN (hypertension)     Family  History  family history includes Hypertension in his brother and other.  Prior Rehab/Hospitalizations:  Has the patient had major surgery during 100 days prior to admission? No  Current Medications   Current facility-administered medications:  . acetaminophen (TYLENOL) tablet 650 mg, 650 mg, Oral, Q4H PRN **OR** acetaminophen (TYLENOL) suppository 650 mg, 650 mg, Rectal, Q4H PRN, [name], MD . aspirin suppository 300 mg, 300 mg, Rectal, Daily **OR** aspirin tablet 325 mg, 325 mg, Oral, Daily, [name], MD, 325 mg at 01/19/15 1021 . atorvastatin (LIPITOR) tablet 40 mg, 40 mg, Oral, q1800, Donzetta Starch, NP, 40 mg at 01/18/15 1746 . folic acid (FOLVITE) tablet 1 mg, 1 mg, Oral, Daily, [name], MD, 1 mg at 01/19/15 1021 . LORazepam (ATIVAN) tablet 1 mg, 1 mg, Oral, Q6H PRN **OR** LORazepam (ATIVAN) injection 1 mg, 1 mg, Intravenous, Q6H PRN, [name], MD . [EXPIRED] LORazepam (ATIVAN) tablet 0-4 mg, 0-4 mg, Oral, 4 times per day, 0 mg at 01/17/15 0415 **FOLLOWED BY** LORazepam (ATIVAN) tablet 0-4 mg, 0-4 mg, Oral, Q12H, [name], MD, 0 mg at 01/19/15 0600 . multivitamin with minerals tablet 1 tablet, 1 tablet, Oral, Daily, [name], MD, 1 tablet at 01/19/15 1021 . nicotine (NICODERM CQ - dosed in mg/24 hours) patch 21 mg, 21 mg, Transdermal, Daily, [name], NP, 21 mg at 01/19/15 1022 . senna-docusate (Senokot-S) tablet 1 tablet, 1 tablet, Oral, QHS PRN, [name], MD . thiamine (VITAMIN B-1) tablet 100 mg, 100 mg, Oral, Daily, 100 mg at 01/19/15 1021 **OR** [DISCONTINUED] thiamine (B-1) injection 100 mg, 100 mg, Intravenous, Daily, [name], MD  Patients Current Diet: Diet Heart Room service appropriate?: Yes; Fluid consistency:: Thin  Precautions / Restrictions Precautions Precautions: Fall Restrictions Weight Bearing Restrictions: No   Has the patient had 2 or more falls or a fall with  injury in the past year?No  Prior  Activity Level Community (5-7x/wk): Independent and active pta. Does not drive.  Patient has been living with [name] and [name] for 3 years. 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 Injured his back and was told by Dr. Gladstone Lighter not to lift more than 25 lbs. Has not held a job since then . He cared for his brother, [name], until he died. Three years ago moved in with [name], wife of deceased brother and his nephew, [name]. He does chores for her in exchange for room and board. He has no income. [name] is not his girlfriend.Patient drinks 5-6 beers per day and smokes daily. Does not see as an issue. Has never gone through DTs.    Home Assistive Devices / Equipment Home Assistive Devices/Equipment: None Home Equipment: Walker - 2 wheels  Prior Device Use: Indicate devices/aids used by the patient prior to current illness, exacerbation or injury? 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 Independent  Indoor Mobility: Did the patient need assistance with walking from room to room (with or without device)? Independent  Stairs: Did the patient need assistance with internal or external stairs (with or without device)? Independent  Functional Cognition: Did the patient need help planning regular tasks such as shopping or remembering to take medications? Independent  Current Functional Level Cognition  Overall Cognitive Status: Within Functional Limits for tasks assessed Orientation Level: Oriented X4   Extremity Assessment (includes Sensation/Coordination)  Upper Extremity Assessment: Defer to OT evaluation RUE Deficits / Details: STRENGTH: all 4/5, decreased grip strength; AROM: WFL; SENSATION: intact; COORDINATION:  decreased fine motor coordination especially thumb opposition RUE Sensation: decreased proprioception RUE Coordination: decreased fine motor, decreased gross motor  Lower Extremity Assessment: RLE deficits/detail RLE Deficits / Details: Rt knee extension 4/5, hip extension 4/5, dorsiflexion 3+/5 but coupled with inversion, Eversion, 2/5, hallux extension 1/5 RLE Sensation: decreased proprioception RLE Coordination: decreased gross motor    ADLs  Overall ADL's : Needs assistance/impaired Grooming: Wash/dry hands, Wash/dry face, Oral care, Minimal assistance, Cueing for safety, Standing Grooming Details (indicate cue type and reason): Verbal cues to incorporate RUE during all tasks and for awareness of positioning of R leg Toilet Transfer: Minimal assistance, Cueing for safety, Ambulation, Regular Toilet, Grab bars, RW Toilet Transfer Details (indicate cue type and reason): Verbal cues for safe hand placement before sitting. Toileting- Clothing Manipulation and Hygiene: Minimal assistance, Cueing for safety, Sit to/from stand Toileting - Clothing Manipulation Details (indicate cue type and reason): Verbal cues for safe R hand placement and to stand up stall on R leg before pulling up underwear Functional mobility during ADLs: Minimal assistance, Rolling walker General ADL Comments: Pt very motivated and responsive to encouragement to use RUE during all self-care tasks despite weakness and decreased fine motor coordination. Pt's HR increased to 120-125 during self-care tasks at sink and returned to <90 quickly after sitting EOB for 30 seconds. Verbal cues throughout for safe use and hand placement while using RW. Provided pt with HEP with squeeze ball and theraputty - pt immediately began using and demonstrated understanding of exercises.     Mobility  General bed mobility comments: in recliner    Transfers  Overall transfer level: Needs assistance Equipment used: Rolling walker (2  wheeled) Transfers: Sit to/from Stand Sit to Stand: Min  assist General transfer comment: Min assist for balance, encouraged to place Rt and Lt hands over Rt knee to increase weight-bearing and prevent compensation with LLE. Performed x2 from recliner.    Ambulation / Gait / Stairs / Wheelchair Mobility  Ambulation/Gait Ambulation/Gait assistance: Mod assist Ambulation Distance (Feet): 60 Feet Assistive device: Rolling walker (2 wheeled) Gait Pattern/deviations: Step-to pattern, Decreased step length - left, Decreased stance time - right, Decreased dorsiflexion - right, Decreased stride length, Decreased weight shift to right, Trunk flexed, Ataxic (Rt ankle inversion) General Gait Details: Poor placement of RLE, lack of heel strike, walking on lateral border. VC for awareness and attempt to correct as able. majority of bout at close guard for safety but has one episode of loss of balance to right requiring mod assist to prevent fall. Rt knee without significant instability (buckling/hyperextension.)  Gait velocity: slow Gait velocity interpretation: <1.8 ft/sec, indicative of risk for recurrent falls    Posture / Balance Balance Overall balance assessment: Needs assistance Sitting-balance support: No upper extremity supported Sitting balance-Leahy Scale: Good Standing balance support: Single extremity supported Standing balance-Leahy Scale: Poor Standing balance comment: R knee buckling and drifting forward while standing at sink. Min assist to maintain balance.    Special needs/care consideration Bowel mgmt: continent Bladder mgmt: continent No PCP, no insurance   Previous Home Environment Living Arrangements: Non-relatives/Friends [name] is pt's deceased brother's Mothe of his nephew, Blinda Leatherwood) Lives With: Friend(s) Available Help at Discharge: Other (Comment) [name] works 630 a until [place] pm. [name], nephew is Paramedic in) Type of Home: House Home Layout: One level Home  Access: Ramped entrance Bathroom Shower/Tub: Tub/shower unit, Architectural technologist: Standard Bathroom Accessibility: Yes How Accessible: Accessible via walker Home Care Services: No Additional Comments: Pt's friend may have BSC and shower seat, but pt is unsure. Lives with his teenage nephew, [name], and his Mom , [name]  Discharge Living Setting Plans for Discharge Living Setting: Lives with (comment), Other (Comment) (Lives with friend, [name], and nephew, [name]) Type of Home at Discharge: House Discharge Home Layout: One level Discharge Home Access: [place] entrance Discharge Bathroom Shower/Tub: Tub/shower unit, Curtain Discharge Bathroom Toilet: Standard Discharge Bathroom Accessibility: Yes How Accessible: Accessible via walker Does the patient have any problems obtaining your medications?: Yes (Describe) (Pt uninsured and has no PCP)  Social/Family/Support Systems Patient Roles: Parent, Other (Comment) (Has 5 children with no contact. 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 Information: Candie Chroman, Nephew's Mother and pt's friend he lives with Anticipated Caregiver: [name] Anticipated Caregiver's Contact Information: Candie Chroman Ability/Limitations of Caregiver: [name] works 630 am until 330 pm Caregiver Availability: Evenings only Discharge Plan Discussed with Primary Caregiver: Yes Is Caregiver In Agreement with Plan?: Yes Does Caregiver/Family have Issues with Lodging/Transportation while Pt is in Rehab?: No  Goals/Additional Needs Patient/Family Goal for Rehab: Mod I with PT and OT Expected length of stay: ELOS 19-22 days Pt/Family Agrees to Admission and willing to participate: Yes Program Orientation Provided & Reviewed with Pt/Caregiver Including Roles & Responsibilities: Yes  Decrease burden of Care through IP rehab admission: n/a  Possible need for SNF placement upon discharge: not anticipated  Patient Condition: This patient's condition remains as documented in  the consult dated 01/19/2015, in which the Rehabilitation Physician determined and documented that the patient's condition is appropriate for intensive rehabilitative care in an inpatient rehabilitation facility. Will admit to inpatient rehab today.  Preadmission Screen Completed By: Cleatrice Burke, 01/19/2015 11:31 AM ______________________________________________________________________  Discussed status with Dr. Letta Pate on 01/19/2015  at 1131 and received telephone approval for admission today.  Admission Coordinator: Cleatrice Burke, time [id number] Date 01/19/2015.          Cosigned by: Charlett Blake, MD at 01/19/2015 11:41 AM  Revision History     Date/Time User Provider Type Action   01/19/2015 11:41 AM Charlett Blake, MD Physician Cosign   01/19/2015 11:32 AM Cristina Gong, RN Rehab Admission Coordinator Sign

## 2015-01-19 NOTE — Discharge Summary (Signed)
Physician Discharge Summary  Royal Kwiecien [id number] DOB: 06-29-1960 DOA: 01/16/2015  PCP: No PCP Per Patient  Admit date: 01/16/2015 Discharge date: 01/19/2015  Time spent: > 30 minutes  Recommendations for Outpatient Follow-up:  1. Patient to be discharged to inpatient rehab 2. Permissive hypertension (OK if < 220/120) for now, start BP agents in 5-7 days if indicated 3. Now on aspirin 325 mg and Lipitor  Discharge Diagnoses:  Active Problems:   Alcohol abuse   Tobacco abuse   Acute CVA (cerebrovascular accident) ([place])   Dyslipidemia   HTN (hypertension)  Discharge Condition: stable  Diet recommendation: heart healthy  Filed Weights   01/17/15 0319  Weight: 78.336 kg (172 lb 11.2 oz)    History of present illness:  See H&P, Labs, Consult and Test reports for all details in brief, patient is a pleasant 54 year old gentleman with history of hypertension, admitted to the medicine service on 01/16/2015 when he presented with complaints of right-sided weakness. He had not been on antiplatelet therapy prior to this presentation. MRI of brain showing acute left MCA territory infarct. Neurology was consulted.  Hospital Course:  Acute CVA - Mr. Maneval is a 54 year old gentleman with a history of alcohol abuse who presented to the emergency department with complaints of right-sided weakness. In the emergency department as how to be hypertensive. He had not been on antiplatelet therapy prior to this presentation. Initial CT scan of brain not reveal acute intracranial abnormality. Follow-up with MRI of brain did show acute left MCA territory infarct without hemorrhage. Neurology was consulted and have followed patient while hospitalized. Transthoracic echocardiogram showed an EF of 55-60% without wall motion abnormalities. Carotid Dopplers revealed 1-39% ICA stenosis. Continue aspirin 325 mg by mouth daily and atorvastatin 40 mg by mouth daily History of alcohol abuse - Patient does  not have evidence of acute alcohol withdrawal Tobacco abuse - determined to quit HTN - off of home antihypertensives for more than a year due to financial constraints, permissive HTN for now in the setting of acute stroke, reintroduce antihypertensives as indicated 5-7 days Dyslipidemia - LDL of 133, Continue statin therapy   Procedures:  2D echo   Consultations:  Neurology   Discharge Exam: Filed Vitals:   01/18/15 1830 01/18/15 2243 01/19/15 0101 01/19/15 0548  BP: 190/96 174/92 157/79 154/86  Pulse: 72 58 62 65  Temp: 98.3 F (36.8 C) 98.1 F (36.7 C) 97.9 F (36.6 C) 98 F (36.7 C)  TempSrc: Oral Oral Oral Oral  Resp: 18 18 20 20   Height:      Weight:      SpO2: 100% 98% 98% 99%   General: NAD Cardiovascular: RRR Respiratory: CTA biL  Discharge Instructions Activity:  As tolerated   Get Medicines reviewed and adjusted: Please take all your medications with you for your next visit with your Primary MD  Please request your Primary MD to go over all hospital tests and procedure/radiological results at the follow up, please ask your Primary MD to get all Hospital records sent to his/her office.  If you experience worsening of your admission symptoms, develop shortness of breath, life threatening emergency, suicidal or homicidal thoughts you must seek medical attention immediately by calling 911 or calling your MD immediately if symptoms less severe.  You must read complete instructions/literature along with all the possible adverse reactions/side effects for all the Medicines you take and that have been prescribed to you. Take any new Medicines after you have completely understood and accpet all the  possible adverse reactions/side effects.   Do not drive when taking Pain medications.   Do not take more than prescribed Pain, Sleep and Anxiety Medications  Special Instructions: If you have smoked or chewed Tobacco in the last 2 yrs please stop smoking, stop any  regular Alcohol and or any Recreational drug use.  Wear Seat belts while driving.  Please note  You were cared for by a hospitalist during your hospital stay. Once you are discharged, your primary care physician will handle any further medical issues. Please note that NO REFILLS for any discharge medications will be authorized once you are discharged, as it is imperative that you return to your primary care physician (or establish a relationship with a primary care physician if you do not have one) for your aftercare needs so that they can reassess your need for medications and monitor your lab values.    Medication List    TAKE these medications        aspirin 325 MG tablet  Take 1 tablet (325 mg total) by mouth daily.     atorvastatin 40 MG tablet  Commonly known as:  LIPITOR  Take 1 tablet (40 mg total) by mouth daily at 6 PM.         The results of significant diagnostics from this hospitalization (including imaging, microbiology, ancillary and laboratory) are listed below for reference.    Significant Diagnostic Studies: Dg Chest 2 View  01/17/2015  CLINICAL DATA:  Right side weakness EXAM: CHEST  2 VIEW COMPARISON:  10/10/2003 FINDINGS: The heart size and mediastinal contours are within normal limits. Both lungs are clear. The visualized skeletal structures are unremarkable. IMPRESSION: No active cardiopulmonary disease. Electronically Signed   By: Rolm Baptise M.D.   On: 01/17/2015 10:52   Ct Head Wo Contrast  01/16/2015  CLINICAL DATA:  Numbness in right foot and arm EXAM: CT HEAD WITHOUT CONTRAST TECHNIQUE: Contiguous axial images were obtained from the base of the skull through the vertex without intravenous contrast. COMPARISON:  None. FINDINGS: Sinuses/Soft tissues: Clear paranasal sinuses and mastoid air cells. Intracranial: Cerebral and cerebellar atrophy which are age advanced. Mild to moderate low density in the periventricular white matter likely related to small  vessel disease. No mass lesion, hemorrhage, hydrocephalus, acute infarct, intra-axial, or extra-axial fluid collection. IMPRESSION: 1.  No acute intracranial abnormality. 2.  Cerebral/cerebellar atrophy and small vessel ischemic change. Electronically Signed   By: Abigail Miyamoto M.D.   On: 01/16/2015 23:33   Mr Brain Wo Contrast  01/17/2015  CLINICAL DATA:  RIGHT-sided weakness which began 01/15/2015. History of hypertension with noncompliance. History of smoking. History of alcohol abuse. EXAM: MRI HEAD WITHOUT CONTRAST MRA HEAD WITHOUT CONTRAST TECHNIQUE: Multiplanar, multiecho pulse sequences of the brain and surrounding structures were obtained without intravenous contrast. Angiographic images of the head were obtained using MRA technique without contrast. COMPARISON:  CT head 01/16/2015. FINDINGS: The patient was unable to remain motionless for the exam. Small or subtle lesions could be overlooked. MRI HEAD FINDINGS There is a moderate-sized area of restricted diffusion affecting the posterior lentiform nucleus, internal capsule, and periventricular white matter on the LEFT consistent with an acute lenticulostriate infarct. No similar findings elsewhere. No hemorrhage, mass lesion, hydrocephalus, or extra-axial fluid. Premature for age atrophy. Chronic microvascular ischemic change affects the periventricular and subcortical white matter. Scattered areas of remote lacunar infarction involve the deep nuclei on the LEFT and the subcortical white matter, as well as the LEFT middle cerebellar peduncle. No  midline abnormality. Flow voids are maintained in the internal carotid, basilar, and vertebral arteries. Absent flow related enhancement LEFT M1 MCA. Extracranial soft tissues are unremarkable. MRA HEAD FINDINGS The RIGHT internal carotid artery is widely patent in its cervical, petrous, cavernous, and supraclinoid segments. The LEFT ICA is widely patent in its cervical, petrous, and supraclinoid segments. 50%  stenosis of the superior cavernous segment LICA. Basilar artery demonstrates focal stenosis proximally, estimated 50%. Both vertebrals contribute to formation of basilar, with mild irregularity on the RIGHT. Moderate stenosis on the LEFT distal most V4 vertebral segment estimated 50%. Moderately irregular RIGHT M1 MCA. Significant stenosis proximal to the bifurcation estimated at 75%. Proximal M2 outpouching suspected to represent a berry aneurysm, estimated 3 mm size, as seen on image 74 series 5. The study is too severely degraded from motion standpoint to confidently confirm, but CTA head recommended for further evaluation. Moderately diseased RIGHT A1 ACA throughout its course, with poor flow related enhancement in the RIGHT A2 and A3 segments. LEFT ICA terminates in robust A1 ACA which gives rise to a normal sized LEFT anterior cerebral artery distally. There is no flow related enhancement in the RIGHT M1 MCA nor is there significant reconstitution in its distal ramifications. Given the acute LEFT MCA lenticulostriate territory infarct, the LEFT MCA occlusion is likely acute. Mildly irregular LEFT P1 PCA. Moderately irregular RIGHT P1 PCA with severe stenosis in its distal ambient segment on the RIGHT. Severely diseased BILATERAL superior cerebellar arteries. Poorly visualized anterior inferior and posterior inferior cerebellar arteries. IMPRESSION: Acute LEFT MCA lenticulostriate territory infarct without hemorrhage. LEFT M1 MCA large vessel occlusion. No significant recanalization of the LEFT M2 or M3 branches. Widespread intracranial atherosclerotic change as described, with potentially flow reducing lesions affecting the distal RIGHT MCA, proximal RIGHT ACA, distal RIGHT PCA, and tandem non stenotic lesions affecting the LEFT vertebral and proximal basilar. 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 Consider further assessment with CTA head due to motion degraded series. Electronically Signed    By: Staci Righter M.D.   On: 01/17/2015 10:50   Mr Jodene Nam Head/brain Wo Cm  01/17/2015  CLINICAL DATA:  RIGHT-sided weakness which began 01/15/2015. History of hypertension with noncompliance. History of smoking. History of alcohol abuse. EXAM: MRI HEAD WITHOUT CONTRAST MRA HEAD WITHOUT CONTRAST TECHNIQUE: Multiplanar, multiecho pulse sequences of the brain and surrounding structures were obtained without intravenous contrast. Angiographic images of the head were obtained using MRA technique without contrast. COMPARISON:  CT head 01/16/2015. FINDINGS: The patient was unable to remain motionless for the exam. Small or subtle lesions could be overlooked. MRI HEAD FINDINGS There is a moderate-sized area of restricted diffusion affecting the posterior lentiform nucleus, internal capsule, and periventricular white matter on the LEFT consistent with an acute lenticulostriate infarct. No similar findings elsewhere. No hemorrhage, mass lesion, hydrocephalus, or extra-axial fluid. Premature for age atrophy. Chronic microvascular ischemic change affects the periventricular and subcortical white matter. Scattered areas of remote lacunar infarction involve the deep nuclei on the LEFT and the subcortical white matter, as well as the LEFT middle cerebellar peduncle. No midline abnormality. Flow voids are maintained in the internal carotid, basilar, and vertebral arteries. Absent flow related enhancement LEFT M1 MCA. Extracranial soft tissues are unremarkable. MRA HEAD FINDINGS The RIGHT internal carotid artery is widely patent in its cervical, petrous, cavernous, and supraclinoid segments. The LEFT ICA is widely patent in its cervical, petrous, and supraclinoid segments. 50% stenosis of the superior cavernous segment LICA. Basilar artery demonstrates  focal stenosis proximally, estimated 50%. Both vertebrals contribute to formation of basilar, with mild irregularity on the RIGHT. Moderate stenosis on the LEFT distal most V4  vertebral segment estimated 50%. Moderately irregular RIGHT M1 MCA. Significant stenosis proximal to the bifurcation estimated at 75%. Proximal M2 outpouching suspected to represent a berry aneurysm, estimated 3 mm size, as seen on image 74 series 5. The study is too severely degraded from motion standpoint to confidently confirm, but CTA head recommended for further evaluation. Moderately diseased RIGHT A1 ACA throughout its course, with poor flow related enhancement in the RIGHT A2 and A3 segments. LEFT ICA terminates in robust A1 ACA which gives rise to a normal sized LEFT anterior cerebral artery distally. There is no flow related enhancement in the RIGHT M1 MCA nor is there significant reconstitution in its distal ramifications. Given the acute LEFT MCA lenticulostriate territory infarct, the LEFT MCA occlusion is likely acute. Mildly irregular LEFT P1 PCA. Moderately irregular RIGHT P1 PCA with severe stenosis in its distal ambient segment on the RIGHT. Severely diseased BILATERAL superior cerebellar arteries. Poorly visualized anterior inferior and posterior inferior cerebellar arteries. IMPRESSION: Acute LEFT MCA lenticulostriate territory infarct without hemorrhage. LEFT M1 MCA large vessel occlusion. No significant recanalization of the LEFT M2 or M3 branches. Widespread intracranial atherosclerotic change as described, with potentially flow reducing lesions affecting the distal RIGHT MCA, proximal RIGHT ACA, distal RIGHT PCA, and tandem non stenotic lesions affecting the LEFT vertebral and proximal basilar. 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 Consider further assessment with CTA head due to motion degraded series. Electronically Signed   By: Staci Righter M.D.   On: 01/17/2015 10:50   Labs: Basic Metabolic Panel:  Recent Labs Lab 01/16/15 2204  NA 137  K 3.5  CL 103  CO2 23  GLUCOSE 98  BUN 12  CREATININE 1.11  CALCIUM 9.3   CBC:  Recent Labs Lab 01/16/15 2204  WBC  11.7*  HGB 16.5  HCT 47.3  MCV 89.1  PLT 284   Cardiac Enzymes:  Recent Labs Lab 01/17/15 0750  TROPONINI <0.03    CBG:  Recent Labs Lab 01/16/15 2219  GLUCAP 84     Signed:  GHERGHE, COSTIN  [facility] 01/19/2015, 10:17 AM

## 2015-01-19 NOTE — Plan of Care (Signed)
Problem: RH BLADDER ELIMINATION Goal: RH STG MANAGE BLADDER WITH ASSISTANCE STG Manage Bladder With Assistance With MIn. Assisst  Problem: RH SKIN INTEGRITY Goal: RH STG SKIN FREE OF INFECTION/BREAKDOWN Min. A  Problem: RH SAFETY Goal: RH STG ADHERE TO SAFETY PRECAUTIONS W/ASSISTANCE/DEVICE STG Adhere to Safety Precautions With Assistance/Device. With Min. A  Problem: RH PAIN MANAGEMENT Goal: RH STG PAIN MANAGED AT OR BELOW PT'S PAIN GOAL Less than 2,on 1 to 10 scale.  Problem: RH KNOWLEDGE DEFICIT Goal: RH STG INCREASE KNOWLEDGE OF HYPERTENSION Educate pt. About medications and follow up with MD,cardiac diet.

## 2015-01-19 NOTE — PMR Pre-admission (Signed)
PMR Admission Coordinator Pre-Admission Assessment  Patient: Michael Davenport is an 54 y.o., male MRN: [id number] DOB: 03/10/1960 Height: 5\' 6"  (167.6 cm) Weight: 78.336 kg (172 lb 11.2 oz)              Insurance Information HMO:     PPO:      PCP:      IPA:      80/20:      OTHER:  PRIMARY: Uninsured        Medicaid Application Date:       Case Manager:  Disability Application Date:       Case Worker:  I contacted [name], Development worker, community, to request disability and Medicaid applications to be initiated 01/19/15.  Emergency Contact Information Contact Information    Name Relation Home Work [place] Friend [phone]  [phone]   [place], Lathem [id number]       Current Medical History  Patient Admitting Diagnosis: L MCA cva  History of Present Illness: Michael Davenport is a 54 y.o. RH-male with history of HTN, ETOH abuse, medical non-compliance who was admitted on 01/16/15 with new onset of right sided weakness and falls. UDS positive for ETOH and THC. 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 2D echo with EF 55-60% and no wall abnormality. Carotid dopplers without significant ICA stenosis. Dr. [name] consulted and recommended ASA [id number] mg for left PLIC infarct due to small vessel disease and aggressive risk factor management. PT/OT evaluations completed and patient limited by right sided weakness with decrease in [facility] affecting ability to carry out ADLs as well as mobility.  Total: 4 NIH    Past Medical History  Past Medical History  Diagnosis Date  . HTN (hypertension)     Family History  family history includes Hypertension in his brother and other.  Prior Rehab/Hospitalizations:  Has the patient had major surgery during 100 days prior  to admission? No  Current Medications   Current facility-administered medications:  .  acetaminophen (TYLENOL) tablet 650 mg, 650 mg, Oral, Q4H PRN **OR** acetaminophen (TYLENOL) suppository 650 mg, 650 mg, Rectal, Q4H PRN, [name], MD .  aspirin suppository 300 mg, 300 mg, Rectal, Daily **OR** aspirin tablet 325 mg, 325 mg, Oral, Daily, [name], MD, 325 mg at 01/19/15 1021 .  atorvastatin (LIPITOR) tablet 40 mg, 40 mg, Oral, q1800, Donzetta Starch, NP, 40 mg at 01/18/15 1746 .  folic acid (FOLVITE) tablet 1 mg, 1 mg, Oral, Daily, [name], MD, 1 mg at 01/19/15 1021 .  LORazepam (ATIVAN) tablet 1 mg, 1 mg, Oral, Q6H PRN **OR** LORazepam (ATIVAN) injection 1 mg, 1 mg, Intravenous, Q6H PRN, [name], MD .  [EXPIRED] LORazepam (ATIVAN) tablet 0-4 mg, 0-4 mg, Oral, 4 times per day, 0 mg at 01/17/15 0415 **FOLLOWED BY** LORazepam (ATIVAN) tablet 0-4 mg, 0-4 mg, Oral, Q12H, [name], MD, 0 mg at 01/19/15 0600 .  multivitamin with minerals tablet 1 tablet, 1 tablet, Oral, Daily, [name], MD, 1 tablet at 01/19/15 1021 .  nicotine (NICODERM CQ - dosed in mg/24 hours) patch 21 mg, 21 mg, Transdermal, Daily, [name], NP, 21 mg at 01/19/15 1022 .  senna-docusate (Senokot-S) tablet 1 tablet, 1 tablet, Oral, QHS PRN, [name], MD .  thiamine (VITAMIN B-1) tablet 100 mg, 100 mg, Oral, Daily, 100 mg at  01/19/15 1021 **OR** [DISCONTINUED] thiamine (B-1) injection 100 mg, 100 mg, Intravenous, Daily, [name], MD  Patients Current Diet: Diet Heart Room service appropriate?: Yes; Fluid consistency:: Thin  Precautions / Restrictions Precautions Precautions: Fall Restrictions Weight Bearing Restrictions: No   Has the patient had 2 or more falls or a fall with injury in the past year?No  Prior Activity Level Community (5-7x/wk): Independent and active pta. Does not drive.  Patient has been living with Michael Davenport and Michael Davenport  for 3 years. 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 Injured his back and was told by Dr. Gladstone Lighter not to lift more than 25 lbs. Has not held a job since then . He cared for his brother, Michael Davenport, until he died. Three years ago moved in with Michael Davenport, wife of deceased brother and his nephew, Michael Davenport. He does chores for her in exchange for room and board. He has no income. Michael Davenport is not his girlfriend.Patient drinks 5-6 beers per day and smokes daily. Does not see as an issue. Has never gone through DTs.    Home Assistive Devices / Equipment Home Assistive Devices/Equipment: None Home Equipment: Walker - 2 wheels  Prior Device Use: Indicate devices/aids used by the patient prior to current illness, exacerbation or injury? 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  Independent  Indoor Mobility: Did the patient need assistance with walking from room to room (with or without device)? Independent  Stairs: Did the patient need assistance with internal or external stairs (with or without device)? Independent  Functional Cognition: Did the patient need help planning regular tasks such as shopping or remembering to take medications? Independent  Current Functional Level Cognition  Overall Cognitive Status: Within Functional Limits for tasks assessed Orientation Level: Oriented X4    Extremity Assessment (includes Sensation/Coordination)  Upper Extremity Assessment: Defer to OT evaluation RUE Deficits / Details: STRENGTH: all 4/5, decreased grip strength; AROM: WFL; SENSATION: intact; COORDINATION: decreased fine motor coordination especially thumb opposition RUE Sensation: decreased proprioception RUE Coordination: decreased fine motor, decreased gross motor   Lower Extremity Assessment: RLE deficits/detail RLE Deficits / Details: Rt knee extension 4/5, hip extension 4/5, dorsiflexion 3+/5 but coupled with inversion, Eversion, 2/5, hallux extension 1/5 RLE Sensation: decreased proprioception RLE Coordination: decreased gross motor    ADLs  Overall ADL's : Needs assistance/impaired Grooming: Wash/dry hands, Wash/dry face, Oral care, Minimal assistance, Cueing for safety, Standing Grooming Details (indicate cue type and reason): Verbal cues to incorporate RUE during all tasks and for awareness of positioning of R leg Toilet Transfer: Minimal assistance, Cueing for safety, Ambulation, Regular Toilet, Grab bars, RW Toilet Transfer Details (indicate cue type and reason): Verbal cues for safe hand placement before sitting. Toileting- Clothing Manipulation and Hygiene: Minimal assistance, Cueing for safety, Sit to/from stand Toileting - Clothing Manipulation Details (indicate cue type and reason): Verbal cues for safe R hand placement and to stand up stall on R leg before pulling up underwear Functional mobility during ADLs: Minimal assistance, Rolling walker General ADL Comments: Pt very motivated and responsive to encouragement to use RUE during all self-care tasks despite weakness and decreased fine motor coordination. Pt's HR increased to 120-125 during self-care tasks at sink and returned to <90 quickly after sitting EOB for 30 seconds. Verbal cues throughout  for safe use and hand placement while using RW. Provided pt with HEP with squeeze ball and theraputty - pt immediately began using and demonstrated understanding of exercises.     Mobility  General bed mobility comments: in recliner    Transfers  Overall transfer level: Needs assistance Equipment used: Rolling walker (2 wheeled) Transfers: Sit to/from Stand Sit to Stand: Min assist General transfer comment: Min assist for balance, encouraged to place Rt and Lt hands over Rt knee to increase  weight-bearing and prevent compensation with LLE. Performed x2 from recliner.    Ambulation / Gait / Stairs / Wheelchair Mobility  Ambulation/Gait Ambulation/Gait assistance: Mod assist Ambulation Distance (Feet): 60 Feet Assistive device: Rolling walker (2 wheeled) Gait Pattern/deviations: Step-to pattern, Decreased step length - left, Decreased stance time - right, Decreased dorsiflexion - right, Decreased stride length, Decreased weight shift to right, Trunk flexed, Ataxic (Rt ankle inversion) General Gait Details: Poor placement of RLE, lack of heel strike, walking on lateral border. VC for awareness and attempt to correct as able. majority of bout at close guard for safety but has one episode of loss of balance to right requiring mod assist to prevent fall. Rt knee without significant instability (buckling/hyperextension.)  Gait velocity: slow Gait velocity interpretation: <1.8 ft/sec, indicative of risk for recurrent falls    Posture / Balance Balance Overall balance assessment: Needs assistance Sitting-balance support: No upper extremity supported Sitting balance-Leahy Scale: Good Standing balance support: Single extremity supported Standing balance-Leahy Scale: Poor Standing balance comment: R knee buckling and drifting forward while standing at sink. Min assist to maintain balance.    Special needs/care consideration Bowel mgmt: continent Bladder mgmt: continent No PCP, no insurance   Previous Home Environment Living Arrangements: Non-relatives/Friends Michael Davenport is pt's deceased brother's Mothe of his nephew, Blinda Leatherwood)  Lives With: Friend(s) Available Help at Discharge: Other (Comment) Michael Davenport works 630 a until [place] pm. Michael Davenport, nephew is Paramedic in) Type of Home: House Home Layout: One level Home Access: Ramped entrance Bathroom Shower/Tub: Tub/shower unit, Architectural technologist: Standard Bathroom Accessibility: Yes How Accessible: Accessible via walker Home Care Services:  No Additional Comments: Pt's friend may have BSC and shower seat, but pt is unsure. Lives with his teenage nephew, Michael Davenport, and his Mom , Michael Davenport  Discharge Living Setting Plans for Discharge Living Setting: Lives with (comment), Other (Comment) (Lives with friend, Michael Davenport, and nephew, Michael Davenport) Type of Home at Discharge: House Discharge Home Layout: One level Discharge Home Access: [place] entrance Discharge Bathroom Shower/Tub: Tub/shower unit, Curtain Discharge Bathroom Toilet: Standard Discharge Bathroom Accessibility: Yes How Accessible: Accessible via walker Does the patient have any problems obtaining your medications?: Yes (Describe) (Pt uninsured and has no PCP)  Social/Family/Support Systems Patient Roles: Parent, Other (Comment) (Has 5 children with no contact. 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 Information: Candie Chroman, Nephew's Mother and pt's friend he lives with Anticipated Caregiver: Michael Davenport Anticipated Caregiver's Contact Information: Candie Chroman Ability/Limitations of Caregiver: Michael Davenport works 630 am until 330 pm Caregiver Availability: Evenings only Discharge Plan Discussed with Primary Caregiver: Yes Is Caregiver In Agreement with Plan?: Yes Does Caregiver/Family have Issues with Lodging/Transportation while Pt is in Rehab?: No  Goals/Additional Needs Patient/Family Goal for Rehab: Mod I with PT and OT Expected length of stay: ELOS 19-22 days Pt/Family Agrees to Admission and willing to participate: Yes Program Orientation Provided & Reviewed with Pt/Caregiver Including Roles  & Responsibilities: Yes  Decrease burden of Care through IP rehab admission: n/a  Possible need for SNF placement upon  discharge: not anticipated  Patient Condition: This patient's condition remains as documented in the consult dated 01/19/2015, in which the Rehabilitation Physician determined and documented that the patient's condition is appropriate for intensive rehabilitative care in an inpatient  rehabilitation facility. Will admit to inpatient rehab today.  Preadmission Screen Completed By:  Cleatrice Burke, 01/19/2015 11:31 AM ______________________________________________________________________   Discussed status with Dr. Letta Pate on 01/19/2015 at  1131 and received telephone approval for admission today.  Admission Coordinator:  Cleatrice Burke, time [phone] Date 01/19/2015.

## 2015-01-19 NOTE — H&P (Signed)
Physical Medicine and Rehabilitation Admission H&P   Chief Complaint  Patient presents with  . Weakness    right side    HPI: Michael Davenport is a 54 y.o. RH-male with history of HTN, ETOH abuse, medical non-compliance who was admitted on 01/16/15 with new onset of right sided weakness and falls. UDS positive for ETOH and THC. 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 2D echo with EF 55-60% and no wall abnormality. Carotid dopplers without significant ICA stenosis. Dr. [name] consulted and recommended ASA [id number] mg for left PLIC infarct due to small vessel disease and aggressive risk factor management. PT/OT evaluations were done yesterday and patient limited by right sided weakness with decrease in [facility] affecting ability to carry out ADLs as well as mobility. CIR recommended for follow up therapy. Pt does not have 24/7 support on discharge.   Patient states that he has friends that he lives with anything go back to live with them. One of his friends is visiting him today. She has a Actor uniform on   Review of Systems  HENT: Negative for hearing loss.  Eyes: Negative for blurred vision, double vision and photophobia.  Respiratory: Negative for cough, sputum production and shortness of breath.  Cardiovascular: Negative for chest pain and palpitations.  Gastrointestinal: Negative for heartburn, nausea and diarrhea.  Genitourinary: Negative for dysuria and urgency.  Musculoskeletal: Negative for myalgias, back pain and joint pain.  Neurological: Positive for focal weakness and headaches. Negative for dizziness and tingling.  Psychiatric/Behavioral: Negative for memory loss. The patient does not have insomnia.      Past Medical History  Diagnosis Date   . HTN (hypertension)     History reviewed. No pertinent past surgical history.    Family History  Problem Relation Age of Onset  . Hypertension Other   . Hypertension Brother     Social History: Lives with friends. 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 Does not drive. He reports that he has been smoking about 1 PPD. He does not have any smokeless tobacco history on file. He reports that he drinks 6-8 beers daily and occasional liquor. He reports that he uses marijuana.    Allergies: No Known Allergies    No prescriptions prior to admission    Home: Home Living Family/patient expects to be discharged to:: Private residence Living Arrangements: Non-relatives/Friends [name] is pt's deceased brother's [place] of his nephew, Blinda Leatherwood) Available Help at Discharge: Other (Comment) [name] works [address] a until [place] pm. [name], nephew is Paramedic in) Type of Home: House Home Access: Ramped entrance Home Layout: One level Bathroom Shower/Tub: Public librarian, Architectural technologist: Standard Bathroom Accessibility: Yes Home Equipment: Environmental consultant - 2 wheels Additional Comments: Pt's friend may have BSC and shower seat, but pt is unsure. 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 Performed x2 from recliner. Ambulation/Gait Ambulation/Gait  assistance: Mod assist Ambulation Distance (Feet): 60 Feet Assistive device: Rolling walker (2 wheeled) Gait Pattern/deviations: Step-to pattern, Decreased step length - left, Decreased stance time - right, Decreased dorsiflexion - right, Decreased stride length, Decreased weight shift to right, Trunk flexed, Ataxic (Rt ankle inversion) General Gait Details: Poor placement of RLE, lack of heel strike, walking on lateral border. VC for awareness and attempt to correct as able. majority of bout at close guard for safety but has one episode of loss of balance to right requiring mod assist to prevent fall. Rt knee without significant instability (buckling/hyperextension.)  Gait velocity: slow Gait velocity interpretation: <1.8 ft/sec, indicative of risk for recurrent falls    ADL: ADL Overall ADL's : Needs assistance/impaired Grooming: Wash/dry hands, Wash/dry face, Oral care, Minimal assistance, Cueing for safety, Standing Grooming Details (indicate cue type and reason): Verbal cues to incorporate RUE during all tasks and for awareness of positioning of R leg Toilet Transfer: Minimal assistance, Cueing for safety, Ambulation, Regular Toilet, Grab bars, RW Toilet Transfer Details (indicate cue type and reason): Verbal cues for safe hand placement before sitting. Toileting- Clothing Manipulation and Hygiene: Minimal assistance, Cueing for safety, Sit to/from stand Toileting - Clothing Manipulation Details (indicate cue type and reason): Verbal cues for safe R hand placement and to stand up stall on R leg before pulling up underwear Functional mobility during ADLs: Minimal assistance, Rolling walker General ADL Comments: Pt very motivated and responsive to encouragement to use RUE during all self-care tasks despite weakness and decreased fine motor coordination. Pt's HR increased to 120-125 during self-care tasks at sink and returned to <90 quickly after sitting EOB for 30 seconds. Verbal cues  throughout for safe use and hand placement while using RW. Provided pt with HEP with squeeze ball and theraputty - pt immediately began using and demonstrated understanding of exercises.   Cognition: Cognition Overall Cognitive Status: Within Functional Limits for tasks assessed Orientation Level: Oriented X4 Cognition Arousal/Alertness: Awake/alert Behavior During Therapy: WFL for tasks assessed/performed Overall Cognitive Status: Within Functional Limits for tasks assessed   Blood pressure 170/98, pulse 79, temperature 98.7 F (37.1 C), temperature source Oral, resp. rate 20, height 5\' 6"  (1.676 m), weight 78.336 kg (172 lb 11.2 oz), SpO2 99 %. Physical Exam  Nursing note and vitals reviewed. Constitutional: He is oriented to person, place, and time. He appears well-developed and well-nourished.  HENT:  Head: Normocephalic and atraumatic.  Mouth/Throat: Oropharynx is clear and moist.  Eyes: Conjunctivae are normal. Pupils are equal, round, and reactive to light.  Neck: Normal range of motion. Neck supple.  Cardiovascular: Normal rate and regular rhythm.  No murmur heard. Respiratory: Effort normal and breath sounds normal. No respiratory distress. He has no wheezes.  GI: Soft. Bowel sounds are normal. He exhibits no distension. There is no tenderness.  Musculoskeletal: He exhibits no edema or tenderness.  Neurological: He is alert and oriented to person, place, and time.  Speech clear.  Able to follow one and two step commands without difficulty.  RUE: Dysmetria, ataxia, dysdiadochokinesia 4/5, proximal to distal RLE: ataxia, 4/5 proximal 2-/5 Ankle DF/PF, inversion , 0/5 eversion LUE/LLE: 5/5 proximal to distal 3+ DTRs RUE/RLE Sensation intact to light touch  Skin: Skin is warm and dry.  Psychiatric: He has a normal mood and affect. His behavior is normal. Thought content normal.     Lab Results Last 48 Hours    No results found for this  or any previous visit (from the  past 48 hour(s)).    Imaging Results (Last 48 hours)    No results found.       Medical Problem List and Plan: 1. Right sided weakness with decreased coordination secondary to L-MCA lenticulostriate territory infarct  2. DVT Prophylaxis/Anticoagulation: Pharmaceutical: Lovenox 3. Pain Management: Tylenol prn for back pain. 4. Mood: LCSW to follow for evaluation and support  5. Neuropsych: This patient is capable of making decisions on his own behalf. 6. Skin/Wound Care: Maintain adequate nutrition and support.  7. Fluids/Electrolytes/Nutrition: Monitor I/O. Check lytes in am. 8. Polysubstance abuse: Advised need for tobacco, marijuana and ETOH cessation.  10. Dyslipidemia: On Lipitor.  11. ETOH abuse: On CIWA protocol.  12. HTN: Blood pressures continue to be elevated. Will start low dose lisinopril in evenings and monitor for any hypotension. Allow BP to run high for 5-7 days to allow for adequate perfusion.  13. Leucocytosis: Monitor for signs of infection. Recheck CBC in am.    Post Admission Physician Evaluation: 1. Functional deficits secondary to L-MCA lenticulostriate territory infarct  2. Patient is admitted to receive collaborative, interdisciplinary care between the physiatrist, rehab nursing staff, and therapy team. 3. Patient's level of medical complexity and substantial therapy needs in context of that medical necessity cannot be provided at a lesser intensity of care such as a SNF. 4. Patient has experienced substantial functional loss from his/her baseline which was documented above under the "Functional History" and "Functional Status" headings. Judging by the patient's diagnosis, physical exam, and functional history, the patient has potential for functional progress which will result in measurable gains while on inpatient rehab. These gains will be of substantial and practical use upon discharge in facilitating mobility and self-care at the household  level. 5. Physiatrist will provide 24 hour management of medical needs as well as oversight of the therapy plan/treatment and provide guidance as appropriate regarding the interaction of the two. 6. 24 hour rehab nursing will assist with bladder management, bowel management, safety, skin/wound care, disease management, medication administration, pain management and patient education and help integrate therapy concepts, techniques,education, etc. 7. PT will assess and treat for/with: pre gait, gait training, endurance , safety, equipment, neuromuscular re education. Goals are: Mod I. 8. OT will assess and treat for/with: ADLs, Cognitive perceptual skills, Neuromuscular re education, safety, endurance, equipment. Goals are: Mod I. Therapy may proceed with showering this patient. 9. SLP will assess and treat for/with: Memory, attention, problem solving, medication management. Goals are: Modified independent medication management. 10. Case Management and Social Worker will assess and treat for psychological issues and discharge planning. 11. Team conference will be held weekly to assess progress toward goals and to determine barriers to discharge. 12. Patient will receive at least 3 hours of therapy per day at least 5 days per week. 13. ELOS: 7-10 days  14. Prognosis: excellent     Charlett Blake M.D. [facility] FAAPM&R (Sports Med, Neuromuscular Med) Diplomate [place] Board of Electrodiagnostic Med  01/19/2015

## 2015-01-19 NOTE — Progress Notes (Signed)
I met with pt at bedside to discuss his rehab venue options. He is in agreement to admit to inpt rehab today. I will contact Financial counselor to initiate Disability and Medicaid applications. RN CM and SW made aware. I will contact Dr. Cruzita Lederer for d/c. [phone]

## 2015-01-19 NOTE — Progress Notes (Signed)
Physical Medicine and Rehabilitation Consult  Reason for Consult: Right sided weakness Referring Physician: Dr. Renne Crigler   HPI: Michael Davenport is a 54 y.o. RH-male with history of HTN, ETOH abuse, medical non-compliance who was admitted on 01/16/15 with new onset of right sided weakness and falls. UDS positive for ETOH and THC. 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 2D echo with EF 55-60% and no wall abnormality. Carotid dopplers without significant ICA stenosis. Dr. [name] consulted and recommended ASA [id number] mg for left PLIC infarct due to small vessel disease and aggressive risk factor management. PT/OT evaluations done today and patient limited by right sided weakness with decrease in [facility] affecting ability to carry out ADLs as well as mobility. CIR recommended for follow up therapy. Pt does not have 24/7 support on discharge.   Review of Systems  HENT: Negative for hearing loss.  Eyes: Negative for blurred vision and double vision.  Respiratory: Negative for cough, sputum production and shortness of breath.  Cardiovascular: Negative for chest pain and palpitations.  Gastrointestinal: Negative for heartburn, nausea, abdominal pain and constipation.  Genitourinary: Negative for dysuria and urgency.  Musculoskeletal: Positive for back pain. Negative for myalgias.  Neurological: Positive for focal weakness. Negative for dizziness, sensory change, speech change and headaches.  Psychiatric/Behavioral: Negative for memory loss. The patient does not have insomnia.  All other systems reviewed and are negative.  Past Medical History  Diagnosis Date  . HTN (hypertension)     History reviewed. No pertinent past surgical history.   Family History  Problem Relation Age of  Onset  . Hypertension Other   . Hypertension Brother     Social History: Lives with friends. 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 Does not drive. He reports that he has been smoking about 1 PPD. He does not have any smokeless tobacco history on file. He reports that he drinks 6-8 beers daily and occasional liquor. He reports that he uses marijuana.   Allergies: No Known Allergies    No prescriptions prior to admission    Home: Home Living Family/patient expects to be discharged to:: Private residence Living Arrangements: Non-relatives/Friends (Friend) Available Help at Discharge: Friend(s), Available PRN/intermittently Type of Home: House Home Access: Level entry Home Layout: One level Bathroom Shower/Tub: Chiropodist: Standard Home Equipment: Environmental consultant - 2 wheels Additional Comments: Pt's friend may have BSC and shower seat, but pt is unsure.  Functional History: Prior Function Level of Independence: Independent Comments: Loves to garden, not working or driving Functional Status:  Mobility: Bed Mobility General bed mobility comments: in recliner Transfers Overall transfer level: Needs assistance Equipment used: Rolling walker (2 wheeled) Transfers: Sit to/from Stand Sit to Stand: Min assist General transfer comment: Min assist for balance, encouraged to place Rt and Lt hands over Rt knee to increase weight-bearing and prevent compensation with LLE. Performed x2 from recliner. Ambulation/Gait Ambulation/Gait assistance: Mod assist Ambulation Distance (Feet): 60 Feet Assistive device: Rolling walker (2 wheeled) Gait Pattern/deviations: Step-to pattern, Decreased step length - left, Decreased stance time - right, Decreased dorsiflexion - right, Decreased stride length, Decreased weight shift to right, Trunk flexed, Ataxic (Rt ankle inversion) General Gait Details: Poor  placement of RLE, lack of heel strike, walking on lateral border. VC for awareness and attempt to correct  as able. majority of bout at close guard for safety but has one episode of loss of balance to right requiring mod assist to prevent fall. Rt knee without significant instability (buckling/hyperextension.)  Gait velocity: slow Gait velocity interpretation: <1.8 ft/sec, indicative of risk for recurrent falls    ADL: ADL Overall ADL's : Needs assistance/impaired Grooming: Wash/dry hands, Wash/dry face, Oral care, Minimal assistance, Cueing for safety, Standing Grooming Details (indicate cue type and reason): Verbal cues to incorporate RUE during all tasks and for awareness of positioning of R leg Toilet Transfer: Minimal assistance, Cueing for safety, Ambulation, Regular Toilet, Grab bars, RW Toilet Transfer Details (indicate cue type and reason): Verbal cues for safe hand placement before sitting. Toileting- Clothing Manipulation and Hygiene: Minimal assistance, Cueing for safety, Sit to/from stand Toileting - Clothing Manipulation Details (indicate cue type and reason): Verbal cues for safe R hand placement and to stand up stall on R leg before pulling up underwear Functional mobility during ADLs: Minimal assistance, Rolling walker General ADL Comments: Pt very motivated and responsive to encouragement to use RUE during all self-care tasks despite weakness and decreased fine motor coordination. Pt's HR increased to 120-125 during self-care tasks at sink and returned to <90 quickly after sitting EOB for 30 seconds. Verbal cues throughout for safe use and hand placement while using RW. Provided pt with HEP with squeeze ball and theraputty - pt immediately began using and demonstrated understanding of exercises.   Cognition: Cognition Overall Cognitive Status: Within Functional Limits for tasks assessed Orientation Level: Oriented X4 Cognition Arousal/Alertness: Awake/alert Behavior During  Therapy: WFL for tasks assessed/performed Overall Cognitive Status: Within Functional Limits for tasks assessed  Blood pressure 174/101, pulse 68, temperature 98.1 F (36.7 C), temperature source Oral, resp. rate 18, height 5\' 6"  (1.676 m), weight 78.336 kg (172 lb 11.2 oz), SpO2 99 %. Physical Exam  Nursing note and vitals reviewed. Constitutional: He is oriented to person, place, and time. He appears well-developed and well-nourished.  HENT:  Head: Normocephalic and atraumatic.  Mouth/Throat: Oropharynx is clear and moist.  Eyes: Conjunctivae and EOM are normal. Pupils are equal, round, and reactive to light. Right eye exhibits no discharge. Left eye exhibits no discharge.  Neck: Normal range of motion. Neck supple.  Cardiovascular: Normal rate and regular rhythm.  No murmur heard. Respiratory: Effort normal and breath sounds normal. No respiratory distress. He has no wheezes.  GI: Soft. Bowel sounds are normal. He exhibits no distension. There is no tenderness.  Musculoskeletal: He exhibits no edema or tenderness.  PROM WNL  Neurological: He is alert and oriented to person, place, and time.  Speech clear.  Able to follow one and two step commands without difficulty.  RUE: Dysmetria, ataxia, dysdiadochokinesia 4/5, proximal to distal RLE: ataxia, 4/5 proximal to distal,  LUE/LLE: 5/5 proximal to distal 3+ DTRs RUE/RLE Sensation intact to light touch  Skin: Skin is warm and dry.  Psychiatric: He has a normal mood and affect. His behavior is normal. Thought content normal.    Lab Results Last 24 Hours    No results found for this or any previous visit (from the past 24 hour(s)).   Dg Chest 2 View  01/17/2015 CLINICAL DATA: Right side weakness EXAM: CHEST 2 VIEW COMPARISON: 10/10/2003 FINDINGS: The heart size and mediastinal contours are within normal limits. Both lungs are clear. The visualized skeletal structures are unremarkable. IMPRESSION: No active cardiopulmonary  disease. Electronically Signed By: Rolm Baptise M.D. On: 01/17/2015 10:52   Ct Head Wo  Contrast  01/16/2015 CLINICAL DATA: Numbness in right foot and arm EXAM: CT HEAD WITHOUT CONTRAST TECHNIQUE: Contiguous axial images were obtained from the base of the skull through the vertex without intravenous contrast. COMPARISON: None. FINDINGS: Sinuses/Soft tissues: Clear paranasal sinuses and mastoid air cells. Intracranial: Cerebral and cerebellar atrophy which are age advanced. Mild to moderate low density in the periventricular white matter likely related to small vessel disease. No mass lesion, hemorrhage, hydrocephalus, acute infarct, intra-axial, or extra-axial fluid collection. IMPRESSION: 1. No acute intracranial abnormality. 2. Cerebral/cerebellar atrophy and small vessel ischemic change. Electronically Signed By: Abigail Miyamoto M.D. On: 01/16/2015 23:33   Mr Brain Wo Contrast  01/17/2015 CLINICAL DATA: RIGHT-sided weakness which began 01/15/2015. History of hypertension with noncompliance. History of smoking. History of alcohol abuse. EXAM: MRI HEAD WITHOUT CONTRAST MRA HEAD WITHOUT CONTRAST TECHNIQUE: Multiplanar, multiecho pulse sequences of the brain and surrounding structures were obtained without intravenous contrast. Angiographic images of the head were obtained using MRA technique without contrast. COMPARISON: CT head 01/16/2015. FINDINGS: The patient was unable to remain motionless for the exam. Small or subtle lesions could be overlooked. MRI HEAD FINDINGS There is a moderate-sized area of restricted diffusion affecting the posterior lentiform nucleus, internal capsule, and periventricular white matter on the LEFT consistent with an acute lenticulostriate infarct. No similar findings elsewhere. No hemorrhage, mass lesion, hydrocephalus, or extra-axial fluid. Premature for age atrophy. Chronic microvascular ischemic change affects the periventricular and subcortical white matter.  Scattered areas of remote lacunar infarction involve the deep nuclei on the LEFT and the subcortical white matter, as well as the LEFT middle cerebellar peduncle. No midline abnormality. Flow voids are maintained in the internal carotid, basilar, and vertebral arteries. Absent flow related enhancement LEFT M1 MCA. Extracranial soft tissues are unremarkable. MRA HEAD FINDINGS The RIGHT internal carotid artery is widely patent in its cervical, petrous, cavernous, and supraclinoid segments. The LEFT ICA is widely patent in its cervical, petrous, and supraclinoid segments. 50% stenosis of the superior cavernous segment LICA. Basilar artery demonstrates focal stenosis proximally, estimated 50%. Both vertebrals contribute to formation of basilar, with mild irregularity on the RIGHT. Moderate stenosis on the LEFT distal most V4 vertebral segment estimated 50%. Moderately irregular RIGHT M1 MCA. Significant stenosis proximal to the bifurcation estimated at 75%. Proximal M2 outpouching suspected to represent a berry aneurysm, estimated 3 mm size, as seen on image 74 series 5. The study is too severely degraded from motion standpoint to confidently confirm, but CTA head recommended for further evaluation. Moderately diseased RIGHT A1 ACA throughout its course, with poor flow related enhancement in the RIGHT A2 and A3 segments. LEFT ICA terminates in robust A1 ACA which gives rise to a normal sized LEFT anterior cerebral artery distally. There is no flow related enhancement in the RIGHT M1 MCA nor is there significant reconstitution in its distal ramifications. Given the acute LEFT MCA lenticulostriate territory infarct, the LEFT MCA occlusion is likely acute. Mildly irregular LEFT P1 PCA. Moderately irregular RIGHT P1 PCA with severe stenosis in its distal ambient segment on the RIGHT. Severely diseased BILATERAL superior cerebellar arteries. Poorly visualized anterior inferior and posterior inferior cerebellar arteries.  IMPRESSION: Acute LEFT MCA lenticulostriate territory infarct without hemorrhage. LEFT M1 MCA large vessel occlusion. No significant recanalization of the LEFT M2 or M3 branches. Widespread intracranial atherosclerotic change as described, with potentially flow reducing lesions affecting the distal RIGHT MCA, proximal RIGHT ACA, distal RIGHT PCA, and tandem non stenotic lesions affecting the LEFT vertebral and proximal basilar.  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 Consider further assessment with CTA head due to motion degraded series. Electronically Signed By: Staci Righter M.D. On: 01/17/2015 10:50    Assessment/Plan: Diagnosis: L-MCA lenticulostriate territory infarct  Labs and images independently reviewed. Records reviewed and summated above. Stroke: Continue secondary stroke prophylaxis and Risk Factor Modification listed below:  Antiplatelet therapy Blood Pressure Management: Continue current medication with prn's with permisive HTN per primary team Statin Agent Diabetes management Tobacco abuse: Cont to counsel  Motor recovery: Fluoxetine  1. Does the need for close, 24 hr/day medical supervision in concert with the patient's rehab needs make it unreasonable for this patient to be served in a less intensive setting? Yes  2. Co-Morbidities requiring supervision/potential complications: HTN (monitor and provide prns in accordance with increased physical exertion and pain), ETOH abuse (CIWA, cont to monitor for withdrawal), medical non-compliance (cont to educate), THC abuse (cont to counsel), tobacco abuse (cont to counsel) 3. Due to safety, disease management, medication administration and patient education, does the patient require 24 hr/day rehab nursing? Yes 4. Does the patient require coordinated care of a physician, rehab nurse, PT (1-2 hrs/day, 5 days/week) and OT (1-2 hrs/day, 5 days/week) to address physical and functional deficits in the context of the  above medical diagnosis(es)? Yes Addressing deficits in the following areas: balance, endurance, locomotion, strength, transferring, bathing, feeding, grooming, toileting and psychosocial support 5. Can the patient actively participate in an intensive therapy program of at least 3 hrs of therapy per day at least 5 days per week? Yes 6. The potential for patient to make measurable gains while on inpatient rehab is excellent 7. Anticipated functional outcomes upon discharge from inpatient rehab are modified independent with PT, modified independent with OT, n/a with SLP. 8. Estimated rehab length of stay to reach the above functional goals is: 19-22 days. 9. Does the patient have adequate social supports and living environment to accommodate these discharge functional goals? Yes 10. Anticipated D/C setting: Home 11. Anticipated post D/C treatments: HH therapy and Home excercise program 12. Overall Rehab/Functional Prognosis: good  RECOMMENDATIONS: This patient's condition is appropriate for continued rehabilitative care in the following setting: CIR Patient has agreed to participate in recommended program. Yes Note that insurance prior authorization may be required for reimbursement for recommended care.  Comment: Rehab Admissions Coordinator to follow up.  Delice Lesch, MD 01/18/2015       Revision History     Date/Time User Provider Type Action   01/18/2015 5:04 PM Ankit Lorie Phenix, MD Physician Sign   01/18/2015 4:46 PM Ankit Lorie Phenix, MD Physician Share   01/18/2015 3:17 PM Bary Leriche, PA-C Physician Assistant Share   View Details Report       Routing History     Date/Time From To Method   01/18/2015 5:04 PM Ankit Lorie Phenix, MD Ankit Lorie Phenix, MD In [facility]   01/18/2015 5:04 PM Ankit Lorie Phenix, MD No Pcp Per Patient In Basket

## 2015-01-19 NOTE — Progress Notes (Signed)
Patient is discharged from room 5M17 at this time. Alert and in stable condition. IV site and tele d/c'd. Report given to receiving nurse Maudry Mayhew, RN on unit [phone]. Transported via wheelchair with all belongings at side.

## 2015-01-20 ENCOUNTER — Inpatient Hospital Stay (HOSPITAL_COMMUNITY): Payer: Medicaid Other | Admitting: Physical Therapy

## 2015-01-20 ENCOUNTER — Inpatient Hospital Stay (HOSPITAL_COMMUNITY): Payer: Medicaid Other | Admitting: Occupational Therapy

## 2015-01-20 DIAGNOSIS — I63411 Cerebral infarction due to embolism of right middle cerebral artery: Secondary | ICD-10-CM

## 2015-01-20 DIAGNOSIS — G8191 Hemiplegia, unspecified affecting right dominant side: Secondary | ICD-10-CM

## 2015-01-20 LAB — CBC
HCT: 45 % (ref 39.0–52.0)
Hemoglobin: 15.3 g/dL (ref 13.0–17.0)
MCH: 30.9 pg (ref 26.0–34.0)
MCHC: 34 g/dL (ref 30.0–36.0)
MCV: 90.9 fL (ref 78.0–100.0)
PLATELETS: 256 10*3/uL (ref 150–400)
RBC: 4.95 MIL/uL (ref 4.22–5.81)
RDW: 12.7 % (ref 11.5–15.5)
WBC: 9.2 10*3/uL (ref 4.0–10.5)

## 2015-01-20 NOTE — Evaluation (Signed)
Physical Therapy Assessment and Plan  Patient Details  Name: Michael Davenport MRN: [id number] Date of Birth: April 17, 1960  PT Diagnosis: Abnormality of gait, Ataxia, Ataxic gait, Coordination disorder, Difficulty walking, Hemiplegia dominant, Impaired cognition and Muscle weakness Rehab Potential: Excellent ELOS: 5-7 days   Today's Date: 01/20/2015 PT Individual Time:Treatment Session 1: 830-930 Treatment Session 2: 1010-1040 Treatment Session 3: 1400-1500 PT Individual Time Calculation (min):Treatment Session 1: 60 min Treatment Session 2: 30 min  Treatment Session 3: 60 min  Problem List:  Patient Active Problem List   Diagnosis Date Noted  . Stroke due to embolism of right middle cerebral artery ([place]) 01/19/2015  . HTN (hypertension) 01/18/2015  . TIA (transient ischemic attack) 01/17/2015  . Alcohol abuse 01/17/2015  . Tobacco abuse 01/17/2015  . Elevated BP 01/17/2015  . Acute CVA (cerebrovascular accident) ([place]) 01/17/2015  . Dyslipidemia     Past Medical History:  Past Medical History  Diagnosis Date  . HTN (hypertension)    Past Surgical History: No past surgical history on file.  Assessment & Plan Clinical Impression: Michael Davenport is a 54 y.o. RH-male with history of HTN, ETOH abuse, medical non-compliance who was admitted on 01/16/15 with new onset of right sided weakness and falls. UDS positive for ETOH and THC. 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 2D echo with EF 55-60% and no wall abnormality. Carotid dopplers without significant ICA stenosis. Dr. [name] consulted and recommended ASA 491 mg for left PLIC infarct due to small vessel disease and aggressive risk factor management. PT/OT evaluations were done yesterday and patient limited by right sided weakness  with decrease in [facility] affecting ability to carry out ADLs as well as mobility. CIR recommended for follow up therapy. Pt does not have 24/7 support on discharge.  Patient transferred to CIR on 01/19/2015 .   Patient currently requires mod with mobility secondary to muscle weakness, impaired timing and sequencing, ataxia and decreased coordination, decreased memory and mild impulsiveness and decreased sitting balance, decreased standing balance, hemiplegia and decreased balance strategies.  Prior to hospitalization, patient was independent  with mobility and lived with Friend(s) in a House home.  Home access is 5-6Ramped entrance, Stairs to enter.  Patient will benefit from skilled PT intervention to maximize safe functional mobility, minimize fall risk and decrease caregiver burden for planned discharge home with intermittent assist.  Anticipate patient will not need PT follow up at discharge.  PT - End of Session Activity Tolerance: Tolerates 30+ min activity without fatigue Endurance Deficit: No PT Assessment Rehab Potential (ACUTE/IP ONLY): Excellent Barriers to Discharge: Decreased caregiver support PT Patient demonstrates impairments in the following area(s): Motor;Safety PT Transfers Functional Problem(s): Bed Mobility;Bed to Chair;Car;Furniture;Floor PT Locomotion Functional Problem(s): Ambulation;Stairs PT Plan PT Intensity: Minimum of 1-2 x/day ,45 to 90 minutes PT Frequency: 5 out of 7 days PT Duration Estimated Length of Stay: 5-7 days PT Treatment/Interventions: Ambulation/gait training;DME/adaptive equipment instruction;Psychosocial support;UE/LE Strength taining/ROM;Balance/vestibular training;UE/LE Coordination activities;Cognitive remediation/compensation;Functional mobility training;Splinting/orthotics;Community reintegration;Neuromuscular re-education;Stair training;Discharge planning;Pain management;Therapeutic Activities;Disease management/prevention;Patient/family  education;Therapeutic Exercise PT Transfers Anticipated Outcome(s): Independent PT Locomotion Anticipated Outcome(s): Independent PT Recommendation Follow Up Recommendations: None Patient destination: Home Equipment Details: tbd  Skilled Therapeutic Intervention Treatment Session 1: Pt received up in recliner, eating breakfast, immediately passed off from OT eval, and agreeable to PT evaluation. PT Evaluation - Pt presents with mild R side  hemiplegia, impaired motor control of R UE/LE, impaired dynamic stand balance, and difficulty with ambulation without AD. Pt is likely to benefit from IPR PT to address these impairments and corresponding functional limitations. Therapeutic Activity - see function tab for details. Pt req intermittent min A - close SBA for sit to stand, and min A - close SBA for stand-step transfers. All bed mobility is mod I in a flat bed without rails at this time. Pt req up to mod A for car transfer due to report of typically riding in high set SUV and LOB when transferring into car (although pt denies use of a riser step in car at time of eval) and pt req only min A to transfer out of car. Gait Training - PT instructs pt in ambulation over level surface (180' multiple times) and over compliant floor mat (10') without AD req min A for balance. PT instructs pt in ascending/descending 12 steps with B handrails req min A in step-to pattern, leading with different LE as pt chooses, but req verbal cues to progress R UE along rail. Pt ended on toilet, due to need for bowel movement. PT notified nursing pt was on toilet.  Treatment Session 2: Pt received up in recliner receiving medication from nursing - agreeable to PT session. Gait Training - PT instructs pt in ambulation from room to/from gym (180' x 2 reps) req min A for balance - focus on R arm swing and equal lateral weight shift with gait, as well as upright head posture. Neuromuscular Reeducation - PT administers Berg Balance Test -  see below for details. Pt scores 28/56, indicating high falls risk. Pt ended up in recliner in room with all needs in reach.  Treatment Session 3: Pt received up in recliner - reporting his R LE had "fallen asleep", but agreeable to work with PT. Gait Training - PT instructs pt in ambulation with HHA req min A for balance - focus on increasing gait speed in an effort to normalize gait pattern and improve balance - pt able to temporarily increase gait speed, but then reverts to slow, stumbly pace. Neuromuscular Reeducation - PT instructs pt in high level dynamic balance activities: side stepping R/L, backwards walking, narrow BOS walking - req min A. Therapeutic Activity - PT instructs pt in mat mobility activity prone to quadruped req CGA for safety, then contralateral arm flexion x 10 reps each side, progressed to contralateral leg extension x 10 reps each side, req min A with UE and min-mod A with LE activity. PT instructs pt in prone to quadruped to tall kneel and instructs pt in practicing short to/from tall kneel activity x 10 reps each, crawling forward/backward/R/L, and tall knee to/from half kneel with UE support on low bench x 10 reps each side: req CGA progressing to SBA. Therapeutic Exercise - PT instructs pt on Nu-step at L5 x 8 minutes. Pt ended up recliner in room with all needs in reach. Continue per PT POC.    PT Evaluation Precautions/Restrictions Precautions Precautions: Fall Precaution Comments: R side weakness Restrictions Weight Bearing Restrictions: No General Chart Reviewed: Yes Family/Caregiver Present: No  Vital SignsTherapy Vitals Temp: 97.8 F (36.6 C) Temp Source: Oral Pulse Rate: 77 Resp: 18 BP: (!) 153/93 mmHg Patient Position (if appropriate): Sitting Oxygen Therapy SpO2: 100 % O2 Device: Not Delivered Pain Pain Assessment Pain Assessment: No/denies pain  Treatment Session 2: Pt denies pain.  Treatment Session 3: Pt denies pain.  Home Living/Prior  Functioning Home Living  Available Help at Discharge: Friend(s);Family;Available PRN/intermittently Type of Home: House Home Access: Ramped entrance;Stairs to enter Entrance Stairs-Number of Steps: 5-6 Entrance Stairs-Rails: Right;Left (one or two rails, pending entrance) Home Layout: One level Bathroom Shower/Tub: Product/process development scientist: Standard Bathroom Accessibility: Yes Additional Comments: Pt's friend may have BSC and shower seat, but pt is unsure.  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 Vision/Perception  Vision - Assessment Eye Alignment: Within Passenger transport manager Range of Motion: Within Functional Limits Alignment/Gaze Preference: Within Defined Limits Tracking/Visual Pursuits: Able to track stimulus in all quads without difficulty Saccades: Within functional limits Perception Comments: wfl  Cognition Overall Cognitive Status: Within Functional Limits for tasks assessed Arousal/Alertness: Awake/alert Orientation Level: Oriented X4 Attention: Focused;Sustained Focused Attention: Appears intact Sustained Attention: Appears intact Selective Attention: Appears intact Memory: Impaired (3 of 4 words recalled) Memory Impairment: Decreased recall of new information Awareness: Appears intact Problem Solving: Appears intact Safety/Judgment: Appears intact Sensation Sensation Light Touch: Appears Intact Stereognosis: Not tested Hot/Cold: Not tested Proprioception: Appears Intact Coordination Gross Motor Movements are Fluid and Coordinated: No Fine Motor Movements are Fluid and Coordinated: Not tested Coordination and Movement Description: less smooth use of R UE when eating breakfast Finger Nose Finger Test:  slower on R side and slight ataxia at endpoint; wfl on L side Heel Shin Test: slightly less smooth movement on R LE; wfl on L side 9 Hole Peg Test: Rt: 1:01 min, Lt: 30 seconds Motor  Motor Motor: Ataxia;Hemiplegia Motor - Skilled Clinical Observations: mild ataxia in R UE during finger to nose test; mild R side hemiplegia  Mobility Bed Mobility Bed Mobility: Rolling Right;Rolling Left;Supine to Sit;Sit to Supine Rolling Right: 6: Modified independent (Device/Increase time) Rolling Left: 6: Modified independent (Device/Increase time) Supine to Sit: 6: Modified independent (Device/Increase time);HOB flat Sit to Supine: 6: Modified independent (Device/Increase time);HOB flat Transfers Transfers: Yes Sit to Stand: 5: Supervision;With upper extremity assist;With armrests;From chair/3-in-1 Sit to Stand Details: Verbal cues for precautions/safety Stand to Sit: 5: Supervision;With upper extremity assist;To bed Stand to Sit Details (indicate cue type and reason): Verbal cues for precautions/safety Stand Pivot Transfers: 5: Supervision Stand Pivot Transfer Details: Verbal cues for precautions/safety Locomotion  Ambulation Ambulation: Yes Ambulation/Gait Assistance: 4: Min assist Ambulation Distance (Feet): 180 Feet Assistive device: 1 person hand held assist Ambulation/Gait Assistance Details: Manual facilitation for weight shifting;Verbal cues for precautions/safety;Verbal cues for technique Gait Gait: Yes Gait Pattern: Impaired Gait Pattern: Step-through pattern;Decreased dorsiflexion - right;Decreased hip/knee flexion - right (R arm held in medium guard) Gait velocity: slow Stairs / Additional Locomotion Stairs: Yes Stairs Assistance: 4: Min assist Stairs Assistance Details: Manual facilitation for weight shifting;Verbal cues for technique;Verbal cues for sequencing;Verbal cues for precautions/safety Stair Management Technique: Two rails Number of Stairs: 12 Height of Stairs:  6 Wheelchair Mobility Wheelchair Mobility: No  Trunk/Postural Assessment  Cervical Assessment Cervical Assessment: Within Functional Limits Thoracic Assessment Thoracic Assessment: Within Functional Limits Lumbar Assessment Lumbar Assessment: Within Functional Limits Postural Control Postural Control: Within Functional Limits  Balance Balance Balance Assessed: Yes Standardized Balance Assessment Standardized Balance Assessment: Berg Balance Test Berg Balance Test Sit to Stand: Needs minimal aid to stand or to stabilize Standing Unsupported: Able to stand 2 minutes with supervision Sitting with Back Unsupported but Feet Supported on Floor or Stool: Able  to sit safely and securely 2 minutes Stand to Sit: Sits independently, has uncontrolled descent Transfers: Able to transfer with verbal cueing and /or supervision Standing Unsupported with Eyes Closed: Able to stand 10 seconds with supervision Standing Ubsupported with Feet Together: Needs help to attain position but able to stand for 30 seconds with feet together From Standing, Reach Forward with Outstretched Arm: Can reach forward >12 cm safely (5") From Standing Position, Pick up Object from Floor: Able to pick up shoe, needs supervision From Standing Position, Turn to Look Behind Over each Shoulder: Looks behind one side only/other side shows less weight shift (less weight shift to the L) Turn 360 Degrees: Needs close supervision or verbal cueing Standing Unsupported, Alternately Place Feet on Step/Stool: Able to complete >2 steps/needs minimal assist Standing Unsupported, One Foot in Front: Needs help to step but can hold 15 seconds Standing on One Leg: Tries to lift leg/unable to hold 3 seconds but remains standing independently Total Score: 28 Static Sitting Balance Static Sitting - Balance Support: Feet supported;No upper extremity supported Static Sitting - Level of Assistance: 7: Independent Dynamic Sitting  Balance Dynamic Sitting - Balance Support: Feet supported;No upper extremity supported Dynamic Sitting - Level of Assistance: 5: Stand by assistance Static Standing Balance Static Standing - Balance Support: During functional activity;No upper extremity supported Static Standing - Level of Assistance: 4: Min assist Dynamic Standing Balance Dynamic Standing - Balance Support: During functional activity;No upper extremity supported Dynamic Standing - Level of Assistance: 4: Min assist Extremity Assessment  RUE Assessment RUE Assessment: Exceptions to [facility] RUE AROM (degrees) Overall AROM Right Upper Extremity: Within functional limits for tasks performed RUE Overall AROM Comments: WFL, slightly slower than Lt RUE Strength RUE Overall Strength: Deficits RUE Overall Strength Comments: grossly 4+/5 LUE Assessment LUE Assessment: Within Functional Limits RLE Assessment RLE Assessment: Exceptions to [facility] RLE AROM (degrees) Overall AROM Right Lower Extremity: Within functional limits for tasks assessed RLE Strength RLE Overall Strength: Deficits RLE Overall Strength Comments: grossly 4+/5 LLE Assessment LLE Assessment: Within Functional Limits   See Function Navigator for Current Functional Status.   Refer to Care Plan for Long Term Goals  Recommendations for other services: None  Discharge Criteria: Patient will be discharged from PT if patient refuses treatment 3 consecutive times without medical reason, if treatment goals not met, if there is a change in medical status, if patient makes no progress towards goals or if patient is discharged from hospital.  The above assessment, treatment plan, treatment alternatives and goals were discussed and mutually agreed upon: by patient  [employer] M 01/20/2015, 9:16 AM

## 2015-01-20 NOTE — Progress Notes (Signed)
  Subjective/Complaints: Pt slept ok, had BM yesterday, bed alarm went off when trying to void at EOB Discussed with OT, should be safe to sit at EOB to use urinal  Objective: Vital Signs: Blood pressure 136/88, pulse 67, temperature 97.8 F (36.6 C), temperature source Oral, resp. rate 18, SpO2 99 %. No results found. No results found for this or any previous visit (from the past 72 hour(s)).   HEENT: normal Cardio: RRR and no murmur Resp: CTA B/L and unlabored GI: BS positive and NT, ND Extremity:  No Edema Skin:   Intact Neuro: Alert/Oriented, Cranial Nerve II-XII normal, Normal Sensory, Abnormal Motor 4/5 in R Delt, Bi, tri, grip, HF, KE 3- ADF and Abnormal FMC Ataxic/ dec FMC Musc/Skel:  Other no pain with UE or LE ROM Gen NAD   Assessment/Plan: 1. Functional deficits secondary to Left MCA with RIght hemiparesis which require 3+ hours per day of interdisciplinary therapy in a comprehensive inpatient rehab setting. Physiatrist is providing close team supervision and 24 hour management of active medical problems listed below. Physiatrist and rehab team continue to assess barriers to discharge/monitor patient progress toward functional and medical goals. FIM:                                    Medical Problem List and Plan: 1. Right sided weakness with decreased coordination secondary to L-MCA lenticulostriate territory infarct no evidence of aphasia or apraxia 2. DVT Prophylaxis/Anticoagulation: Pharmaceutical: Lovenox, will d/c SCD 3. Pain Management: Tylenol prn for back pain. 4. Mood: LCSW to follow for evaluation and support  5. Neuropsych: This patient is capable of making decisions on his own behalf. 6. Skin/Wound Care: Maintain adequate nutrition and support.  7. Fluids/Electrolytes/Nutrition: Monitor I/O. Check lytes in am. 8. Polysubstance abuse: Advised need for tobacco, marijuana and ETOH cessation.  10. Dyslipidemia: On Lipitor.  11.  ETOH abuse: On CIWA protocol.  12. HTN: Blood pressures continue to be elevated. Will start low dose lisinopril in evenings and monitor for any hypotension. Allow BP to run high for 5-7 days post CVA to allow for adequate perfusion. monitor for symptoms of dizziness or lethargy 13. Leucocytosis: Monitor for signs of infection. Recheck CBC today   LOS (Days) 1 A FACE TO FACE EVALUATION WAS PERFORMED  KIRSTEINS,ANDREW E 01/20/2015, 8:22 AM

## 2015-01-20 NOTE — IPOC Note (Signed)
Overall Plan of Care [facility]) Patient Details Name: Michael Davenport MRN: [id number] DOB: Jun 11, 1960  Admitting Diagnosis: CVA  Hospital Problems: Active Problems:   Stroke due to embolism of right middle cerebral artery ([place])     Functional Problem List: Nursing Endurance, Safety  PT Motor, Safety  OT Balance, Motor, Safety  SLP    TR         Basic ADL's: OT Eating, Grooming, Bathing, Dressing, Toileting     Advanced  ADL's: OT Simple Meal Preparation, Light Housekeeping     Transfers: PT Bed Mobility, Bed to Chair, Car, [employer], Futures trader, Tub/Shower     Locomotion: PT Ambulation, Stairs     Additional Impairments: OT Fuctional Use of Upper Extremity  SLP        TR      Anticipated Outcomes Item Anticipated Outcome  Self Feeding Mod I  Swallowing      Basic self-care  Mod I  Toileting  Mod I   Bathroom Transfers Mod I  Bowel/Bladder  Continent to bowel and bladder independent.  Transfers  Independent  Locomotion  Independent  Communication     Cognition     Pain  Less than 2,on 1 to 10 scale.  Safety/Judgment  Free from falls during his stay in rehab.   Therapy Plan: PT Intensity: Minimum of 1-2 x/day ,45 to 90 minutes PT Frequency: 5 out of 7 days PT Duration Estimated Length of Stay: 5-7 days OT Intensity: Minimum of 1-2 x/day, 45 to 90 minutes OT Frequency: 5 out of 7 days OT Duration/Estimated Length of Stay: 7-10 days         Team Interventions: Nursing Interventions Patient/Family Education, Disease Management/Prevention, Medication Management  PT interventions Ambulation/gait training, DME/adaptive equipment instruction, Psychosocial support, UE/LE Strength taining/ROM, Training and development officer, UE/LE Coordination activities, Cognitive remediation/compensation, Functional mobility training, Splinting/orthotics, Academic librarian, Neuromuscular re-education, IT trainer, Discharge planning, Pain management,  Therapeutic Activities, Disease management/prevention, Barrister's clerk education, Therapeutic Exercise  OT Interventions Training and development officer, Discharge planning, Community reintegration, Disease mangement/prevention, Engineer, drilling, Functional electrical stimulation, Neuromuscular re-education, Functional mobility training, Patient/family education, Psychosocial support, Self Care/advanced ADL retraining, Therapeutic Activities, Therapeutic Exercise, UE/LE Strength taining/ROM, UE/LE Coordination activities  SLP Interventions    TR Interventions    SW/CM Interventions Discharge Planning, Psychosocial Support, Patient/Family Education    Team Discharge Planning: Destination: PT-Home ,OT- Home , SLP-  Projected Follow-up: PT-None, OT-  Outpatient OT, Home health OT (TBD based on transportation), SLP-  Projected Equipment Needs: PT- , OT- To be determined, Tub/shower seat, SLP-  Equipment Details: PT-tbd, OT-  Patient/family involved in discharge planning: PT- Patient,  OT-Patient, SLP-   MD ELOS: 7-10d Medical Rehab Prognosis:  Good Assessment: 54 y.o. RH-male with history of HTN, ETOH abuse, medical non-compliance who was admitted on 01/16/15 with new onset of right sided weakness and falls. UDS positive for ETOH and THC. 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  Now requiring 24/7 Rehab RN,MD, as well as CIR level PT, OT and SLP.  Treatment team will focus on ADLs and mobility with goals set at Mod I   See Team Conference Notes for weekly updates to the plan of care

## 2015-01-20 NOTE — Evaluation (Signed)
Occupational Therapy Assessment and Plan  Patient Details  Name: Michael Davenport MRN: [id number] Date of Birth: [birth year]  OT Diagnosis: ataxia, hemiplegia affecting dominant side and muscle weakness (generalized) Rehab Potential: Rehab Potential (ACUTE ONLY): Good ELOS: 7-10 days   Today's Date: 01/20/2015 OT Individual Time: 6195-0932 OT Individual Time Calculation (min): 60 min     Problem List:  Patient Active Problem List   Diagnosis Date Noted  . Stroke due to embolism of right middle cerebral artery ([place]) 01/19/2015  . HTN (hypertension) 01/18/2015  . TIA (transient ischemic attack) 01/17/2015  . Alcohol abuse 01/17/2015  . Tobacco abuse 01/17/2015  . Elevated BP 01/17/2015  . Acute CVA (cerebrovascular accident) ([place]) 01/17/2015  . Dyslipidemia     Past Medical History:  Past Medical History  Diagnosis Date  . HTN (hypertension)    Past Surgical History: No past surgical history on file.  Assessment & Plan Clinical Impression: Patient is a 54 y.o. RH-male with history of HTN, ETOH abuse, medical non-compliance who was admitted on 01/16/15 with new onset of right sided weakness and falls. UDS positive for ETOH and THC. 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 2D echo with EF 55-60% and no wall abnormality. Carotid dopplers without significant ICA stenosis. Dr. [name] consulted and recommended ASA 671 mg for left PLIC infarct due to small vessel disease and aggressive risk factor management. PT/OT evaluations were done yesterday and patient limited by right sided weakness with decrease in [facility] affecting ability to carry out ADLs as well as mobility. CIR recommended for follow up therapy as pt does not have 24/7 support on discharge.   Patient transferred to CIR on  01/19/2015 .   Patient currently requires min with basic self-care skills and IADL secondary to muscle weakness, impaired timing and sequencing, unbalanced muscle activation and decreased coordination and decreased standing balance, decreased postural control, hemiplegia and decreased balance strategies.  Prior to hospitalization, patient could complete ADLs and IADLs with independent .  Patient will benefit from skilled intervention to increase independence with basic self-care skills and increase level of independence with iADL prior to discharge home independently.  Anticipate patient will require intermittent supervision and follow up outpatient.  OT - End of Session Activity Tolerance: Tolerates 30+ min activity without fatigue OT Assessment Rehab Potential (ACUTE ONLY): Good Barriers to Discharge: Decreased caregiver support Barriers to Discharge Comments: friend he lives with works during the day OT Patient demonstrates impairments in the following area(s): Balance;Motor;Safety OT Basic ADL's Functional Problem(s): Eating;Grooming;Bathing;Dressing;Toileting OT Advanced ADL's Functional Problem(s): Simple Meal Preparation;Light Housekeeping OT Transfers Functional Problem(s): Toilet;Tub/Shower OT Additional Impairment(s): Fuctional Use of Upper Extremity OT Plan OT Intensity: Minimum of 1-2 x/day, 45 to 90 minutes OT Frequency: 5 out of 7 days OT Duration/Estimated Length of Stay: 7-10 days OT Treatment/Interventions: Balance/vestibular training;Discharge planning;Community reintegration;Disease Lawyer;Functional electrical stimulation;Neuromuscular re-education;Functional mobility training;Patient/family education;Psychosocial support;Self Care/advanced ADL retraining;Therapeutic Activities;Therapeutic Exercise;UE/LE Strength taining/ROM;UE/LE Coordination activities OT Self Feeding Anticipated Outcome(s): Mod I OT Basic Self-Care  Anticipated Outcome(s): Mod I OT Toileting Anticipated Outcome(s): Mod I OT Bathroom Transfers Anticipated Outcome(s): Mod I OT Recommendation Recommendations for Other Services: Neuropsych consult Patient destination: Home Follow Up Recommendations: Outpatient OT;Home health OT (TBD based on transportation) Equipment Recommended: To be determined;Tub/shower seat   Skilled Therapeutic Intervention OT eval completed with discussion of rehab process, OT  purpose, POC, goals, and ELOS.  Pt ambulated to room shower with RW and min assist - contact guard.  Pt completed bathing at overall supervision from sit > stand level.  LB dressing with underwear and socks at sit > stand level with supervision and no UB dressing, therefore donned hospital gown with setup.  Ambulated back to recliner without RW and hand held assist.  Encouraged use of dominant RUE with self-care tasks and self-feeding.  OT Evaluation Precautions/Restrictions  Precautions Precautions: Fall General   Vital Signs Therapy Vitals Temp: 97.8 F (36.6 C) Temp Source: Oral Pulse Rate: 67 Resp: 18 BP: 136/88 mmHg Patient Position (if appropriate): Lying Oxygen Therapy SpO2: 99 % O2 Device: Not Delivered Pain Pain Assessment Pain Assessment: No/denies pain Home Living/Prior Functioning Home Living Family/patient expects to be discharged to:: Private residence Living Arrangements: Spouse/significant other Available Help at Discharge: Other (Comment) [name] works 630[address] to 68P and [name] is a Paramedic in high school) Type of Home: [employer] Access: Ramped entrance, Stairs to enter [employer] of Steps: 5-6 Home Layout: One level Bathroom Shower/Tub: Tub/shower unit, Architectural technologist: Standard Bathroom Accessibility: Yes Additional Comments: Pt's friend may have BSC and shower seat, but pt is unsure.  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  See Function Navigator Vision/Perception  Vision- History Baseline Vision/History: Wears glasses Wears Glasses: At all times Patient Visual Report: No change from baseline Vision- Assessment Vision Assessment?: Yes Eye Alignment: Within Functional Limits Ocular Range of Motion: Within Functional Limits Alignment/Gaze Preference: Within Defined Limits Tracking/Visual Pursuits: Able to track stimulus in all quads without difficulty Saccades: Within functional limits Visual Fields: No apparent deficits  Cognition Overall Cognitive Status: Within Functional Limits for tasks assessed Arousal/Alertness: Awake/alert Orientation Level: Person;Place;Situation Person: Oriented Place: Oriented Situation: Oriented Year: 2016 Month: December Day of Week: Correct Memory: Appears intact Immediate Memory Recall: Sock;Blue;Bed Memory Recall: Sock;Blue;Bed Memory Recall Sock: Without Cue Memory Recall Blue: Without Cue Memory Recall Bed: Without Cue Attention: Selective Selective Attention: Appears intact Awareness: Appears intact Problem Solving: Appears intact Safety/Judgment: Appears intact Sensation Sensation Light Touch: Appears Intact Stereognosis: Not tested Hot/Cold: Not tested Proprioception: Appears Intact Coordination Gross Motor Movements are Fluid and Coordinated: No Fine Motor Movements are Fluid and Coordinated: No Finger Nose Finger Test: decreased speed on Rt 9 Hole Peg Test: Rt: 1:01 min, Lt: 30 seconds Mobility  Transfers Transfers: Sit to Stand Sit to Stand: 4: Min assist Sit to Stand Details: Tactile cues for posture;Verbal cues for precautions/safety  Extremity/Trunk Assessment RUE Assessment RUE Assessment:  Exceptions to [facility] RUE AROM (degrees) RUE Overall AROM Comments: WFL, slightly slower than Lt RUE Strength RUE Overall Strength Comments: strength grossly 4/5, loose gross grasp LUE Assessment LUE Assessment: Within Functional Limits   See Function Navigator for Current Functional Status.   Refer to Care Plan for Long Term Goals  Recommendations for other services: None  Discharge Criteria: Patient will be discharged from OT if patient refuses treatment 3 consecutive times without medical reason, if treatment goals not met, if there is a change in medical status, if patient makes no progress towards goals or if patient is discharged from hospital.  The above assessment, treatment plan, treatment alternatives and goals were  discussed and mutually agreed upon: by patient  [name] 01/20/2015, 8:31 AM

## 2015-01-20 NOTE — Care Management Note (Signed)
Inpatient [facility] Individual Statement of Services  Patient Name:  Michael Davenport  Date:  01/20/2015  Welcome to the [facility].  Our goal is to provide you with an individualized program based on your diagnosis and situation, designed to meet your specific needs.  With this comprehensive rehabilitation program, you will be expected to participate in at least 3 hours of rehabilitation therapies Monday-Friday, with modified therapy programming on the weekends.  Your rehabilitation program will include the following services:  Physical Therapy (PT), Occupational Therapy (OT), Speech Therapy (ST), 24 hour per day rehabilitation nursing, Therapeutic Recreaction (TR), Neuropsychology, Case Management (Social Worker), Rehabilitation Medicine, Nutrition Services and Pharmacy Services  Weekly team conferences will be held on Wednesday to discuss your progress.  Your Social Worker will talk with you frequently to get your input and to update you on team discussions.  Team conferences with you and your family in attendance may also be held.  Expected length of stay: 6-9 days  Overall anticipated outcome: mod/i level-independent  Depending on your progress and recovery, your program may change. Your Social Worker will coordinate services and will keep you informed of any changes. Your Social Worker's name and contact numbers are listed  below.  The following services may also be recommended but are not provided by the [facility]:    [place] will be made to provide these services after discharge if needed.  Arrangements include referral to agencies that provide these services.  Your insurance has been verified to be:  None-will apply for Medicaid Your primary doctor is:  None  Pertinent information will be shared with your doctor and your insurance company.  Social  Worker:  Ovidio Kin, [place] or [phone]  Information discussed with and copy given to patient by: Elease Hashimoto, 01/20/2015, 9:53 AM

## 2015-01-20 NOTE — Progress Notes (Signed)
Social Work  Social Work Assessment and Plan  Patient Details  Name: Michael Davenport MRN: [id number] Date of Birth: [birth year]  Today's Date: 01/20/2015  Problem List:  Patient Active Problem List   Diagnosis Date Noted  . Stroke due to embolism of right middle cerebral artery ([place]) 01/19/2015  . HTN (hypertension) 01/18/2015  . TIA (transient ischemic attack) 01/17/2015  . Alcohol abuse 01/17/2015  . Tobacco abuse 01/17/2015  . Elevated BP 01/17/2015  . Acute CVA (cerebrovascular accident) ([place]) 01/17/2015  . Dyslipidemia    Past Medical History:  Past Medical History  Diagnosis Date  . HTN (hypertension)    Past Surgical History: No past surgical history on file. Social History:  reports that he has been smoking.  He quit smokeless tobacco use 5 days ago. He reports that he drinks about 14.4 oz of alcohol per week. He reports that he does not use illicit drugs.  Family / Support Systems Marital Status: Single Patient Roles: Parent, Other (Comment) (uncle) Children: has five children he has no contact with Other Supports: Michael Davenport-friend Anticipated Caregiver: Michael Davenport and friends Ability/Limitations of Caregiver: Michael Davenport works 6:30-3;30 pm daily, nephew in school Caregiver Availability: Evenings only Family Dynamics: Cared for brother-Michael Davenport before he died and he was married to Michael Davenport and the fahter to pt's nephew. 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 He has another brother whom he see's at times.  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 He appears to be recovering well from his stroke. He states: " I walked to the gym this morning."  He is pleased with his progress and wants to be independent before he leaves here. He needs to be since he has no caregiver at home. Recent Psychosocial Issues: Past back issues and inability to work Pyschiatric History: No history feels he is doing well with all of this, he relies upon himself he always has. He states: " I will make it I always do."  Seems to be a short lenght of stay will monitor throughout stay and see if would need neuro-psych Substance Abuse History: ETOH-beer and THC he feels it is not an issue he can stop when he wants too. Aware of resources out in the community for this.  Patient / Family Perceptions, Expectations & Goals Pt/Family understanding of illness & functional limitations: Pt can explain his stroke and deficits, his right leg is most affected. He is recovering though. He talks with the MD and feels his questions are being addressed and has no other concerns. Premorbid pt/family roles/activities: Michael Davenport, father, etc Anticipated changes in roles/activities/participation: resume Pt/family expectations/goals: Pt states: " I will get independent before I leave here, I am almost there."  He is confident he will get mod/i before discharge from rehab.  Community Resources Express Scripts: None Premorbid Home Care/DME Agencies: None Transportation available at discharge: Michael Davenport drives, so all appointments need to be arranged after 4:00  Resource referrals recommended: Support group (specify)  Discharge Planning Living Arrangements: Non-relatives/Friends Support Systems:  Friends/neighbors, Other relatives Type of Residence: Private residence Insurance Resources: Teacher, adult education Resources: Other (Comment) (Does home management for room and board) Financial Screen Referred: Yes Living Expenses: Other (Comment) Michael Davenport) Money Management: Other (Comment) Michael Davenport) Does the patient have any problems obtaining your medications?: Yes (Describe) (no insurance need to connect with Michael Davenport prior to discharge) Home Management: Patient Patient/Family Preliminary Plans: Return home with Michael Davenport and her son, but will be alone during the day while she is working and nephew in high school. Will need to be mod/i before discharge. Social Work Anticipated Follow Up Needs: HH/OP, Support Group  Clinical Impression Pleasant gentleman who takes life as it comes, he has always been a survivor and managed. He is confident he will recover from this and regain his independence. Will connect him with Michael Davenport for medical follow up and assistance with Medications, so hopefully he will be compliant and prevent another stroke. He has a good attitude and is willing to work to regain his strength and movement, should not be here long.   Michael Davenport 01/20/2015, 10:09 AM

## 2015-01-21 ENCOUNTER — Inpatient Hospital Stay (HOSPITAL_COMMUNITY): Payer: Medicaid Other | Admitting: Physical Therapy

## 2015-01-21 ENCOUNTER — Inpatient Hospital Stay (HOSPITAL_COMMUNITY): Payer: Medicaid Other | Admitting: Occupational Therapy

## 2015-01-21 NOTE — Progress Notes (Signed)
Physical Therapy Session Note  Patient Details  Name: Michael Davenport MRN: [id number] Date of Birth: 08-Aug-1960  Today's Date: 01/21/2015 PT Individual Time: 0906-1001 PT Individual Time Calculation (min): 55 min   Short Term Goals: Week 1:  PT Short Term Goal 1 (Week 1): STGs = LTGs due to ELOS  Skilled Therapeutic Interventions/Progress Updates:    Pt received resting in recliner and agreeable to therapy session.  Session focus on R NMR, core strengthening, and activity tolerance.  Sit<>stand from recliner with steady assist for balance and verbal cues to gain balance before beginning to walk.  PT instructed patient in amb to therapy gym with steady assist and verbal cues for RLE and upright posture.  PT instructed patient in 10 min on Kinetron in seated position at 10 cm/s for NMR and activity tolerance.  PT instructed pt in repeated sit<>stand in kinetron with focus on equal weight bearing through LEs and smooth transition from sit>stand (pt tends to fully extend knees before extending trunk).  Total of 15 trials of sit<>stand progressing from 0 cm/s to 15 cm/s for increased challenge and mirror used for visual feedback.  PT instructed patient in 2x15 bridges with BLE support, LLE support, and RLE support.  PT instructed patient in R NMR activity tapping feet to 6" step: RLE and LLE taps, then alternating taps, then crossing midline.  PT instructed patient in activity for R hip strength/stability, L foot propped on BOSU ball moving horse shoes across parallel bars, PT provided perturbations to BOSU ball for increased challenge.  Pt amb back to room at end of session with steady assist and verbal cues for RLE and positioned in recliner with call bell in reach and needs met.   Therapy Documentation Precautions:  Precautions Precautions: Fall Precaution Comments: R side weakness Restrictions Weight Bearing Restrictions: No Pain: Pain Assessment Pain Assessment: No/denies pain   See Function  Navigator for Current Functional Status.   Therapy/Group: Individual Therapy  Earnest Conroy Penven-Crew 01/21/2015, 12:25 PM

## 2015-01-21 NOTE — Progress Notes (Signed)
Physical Therapy Session Note  Patient Details  Name: Michael Davenport MRN: [id number] Date of Birth: 04-Mar-1960  Today's Date: 01/21/2015 PT Individual Time: 1400-1445 PT Individual Time Calculation (min): 45 min   Short Term Goals: Week 1:  PT Short Term Goal 1 (Week 1): STGs = LTGs due to ELOS  Skilled Therapeutic Interventions/Progress Updates:  Pt was seen bedside in the pm. Pt performed all transfers sit to stand with S. Pt performed all stand pivot transfers with S to min guard. Pt ambulated 200 and 400 feet without assistive device, S to min guard with verbal cues. In gym treatment focused on NMR, utilizing step taps and alternating step taps, 3 sets x 10 reps each. Pt rode Nu-step x 10 minutes at level 5 without rest break. Pt returned to room and left sitting up in recliner with call bell within reach.   Therapy Documentation Precautions:  Precautions Precautions: Fall Precaution Comments: R side weakness Restrictions Weight Bearing Restrictions: No General:   Pain: No c/o pain.   See Function Navigator for Current Functional Status.   Therapy/Group: Individual Therapy  Dub Amis 01/21/2015, 3:47 PM

## 2015-01-21 NOTE — Progress Notes (Signed)
Occupational Therapy Session Note  Patient Details  Name: Michael Davenport MRN: [id number] Date of Birth: [birth year]  Today's Date: 01/21/2015 OT Individual Time:  - 1100-1200  (60 min)  1st session                                          1300-1330  30 min)  2nd session  Short Term Goals: Week 1:  OT Short Term Goal 1 (Week 1): STG = LTGs due to short ELOS Week 2:     Skilled Therapeutic Interventions/Progress Updates:      1st session:  Engaged in [place] and tub transfers.  Ambulated to ADL apt from room with min to close supervision.  Transferred to tub with tub bench and minimal cues.  Pt ambulated to gym with OT providing instuction for increased dorsiflexion.  Pt needed max cues for carry over.  Provided RUE exercises for extensor muscles for pt to practice for 1 minute.  There were as follows:  BUE opening and closing the gate with shoulder add and abd;  2. Shoulder flex.extension, abduction (callistintics;  3.  sho abd with supination and pronation; 4.  Hand touching straw (F2N);  5. Hand on book and flinger extension; 6.  Ball toss; do no pick up ball if you drop it.   2nd session: Engaged in mat activities for [employer].  Ambulated to gym with heel toe strides and 1 LoB but pt able to regain.  OT instructs pt on quadriped exercises diagonal patterns and the unilateral side.  Pt demonstrated good static control except for left leg up and right arm up.  Did sidelying with weight on right side for 10 minutes in game of connect four.  Ambulated back to room and left with all  Needs in reach.      Therapy Documentation Precautions:  Precautions Precautions: Fall Precaution Comments: R side weakness Restrictions Weight Bearing Restrictions: No      Pain:  None in either session          See Function Navigator for Current Functional Status.   Therapy/Group: Individual Therapy  Lisa Roca 01/21/2015, 11:53 AM

## 2015-01-21 NOTE — Progress Notes (Signed)
Subjective/Complaints: Had a good night. Up eating breakast this am.   ROS: Pt denies fever, rash/itching, headache, blurred or double vision, nausea, vomiting, abdominal pain, diarrhea, chest pain, shortness of breath, palpitations, dysuria, dizziness, neck or back pain, bleeding, anxiety, or depression  Objective: Vital Signs: Blood pressure 156/82, pulse 70, temperature 98.2 F (36.8 C), temperature source Oral, resp. rate 18, SpO2 100 %. No results found. Results for orders placed or performed during the hospital encounter of 01/19/15 (from the past 72 hour(s))  CBC     Status: None   Collection Time: 01/20/15 11:07 AM  Result Value Ref Range   WBC 9.2 4.0 - 10.5 K/uL   RBC 4.95 4.22 - 5.81 MIL/uL   Hemoglobin 15.3 13.0 - 17.0 g/dL   HCT 45.0 39.0 - 52.0 %   MCV 90.9 78.0 - 100.0 fL   MCH 30.9 26.0 - 34.0 pg   MCHC 34.0 30.0 - 36.0 g/dL   RDW 12.7 11.5 - 15.5 %   Platelets 256 150 - 400 K/uL     HEENT: normal Cardio: RRR and no murmur Resp: CTA B/L and unlabored GI: BS positive and NT, ND Extremity:  No Edema Skin:   Intact Neuro: Alert/Oriented, Cranial Nerve II-XII normal, Normal Sensory, Abnormal Motor 4/5 in R Delt, Bi, tri, grip, HF, KE 3- ADF and Abnormal FMC Ataxic/ dec FMC Musc/Skel:  Other no pain with UE or LE ROM Gen NAD   Assessment/Plan: 1. Functional deficits secondary to Left MCA with RIght hemiparesis which require 3+ hours per day of interdisciplinary therapy in a comprehensive inpatient rehab setting. Physiatrist is providing close team supervision and 24 hour management of active medical problems listed below. Physiatrist and rehab team continue to assess barriers to discharge/monitor patient progress toward functional and medical goals. FIM: Function - Bathing Position: Shower Body parts bathed by patient: Right arm, Left arm, Chest, Abdomen, Front perineal area, Buttocks, Right upper leg, Left upper leg, Right lower leg, Left lower leg,  Back Assist Level: Supervision or verbal cues  Function- Upper Body Dressing/Undressing What is the patient wearing?: Hospital gown Assist Level: Supervision or verbal cues, Set up Function - Lower Body Dressing/Undressing What is the patient wearing?: Underwear, Non-skid slipper socks Position: Wheelchair/chair at sink Underwear - Performed by patient: Thread/unthread right underwear leg, Thread/unthread left underwear leg, Pull underwear up/down Non-skid slipper socks- Performed by patient: Don/doff right sock, Don/doff left sock Assist for lower body dressing: Supervision or verbal cues, Set up Set up : To obtain clothing/put away  Function - Toileting Toileting steps completed by patient: Adjust clothing prior to toileting Toileting Assistive Devices: Grab bar or rail Assist level: Touching or steadying assistance (Pt.75%)  Function - Air cabin crew transfer assistive device: Grab bar Assist level to toilet: Supervision or verbal cues Assist level from toilet: Supervision or verbal cues  Function - Chair/bed transfer Chair/bed transfer method: Stand pivot Chair/bed transfer assist level: Touching or steadying assistance (Pt > 75%) Chair/bed transfer assistive device: Armrests Chair/bed transfer details: Verbal cues for precautions/safety  Function - Locomotion: Wheelchair Will patient use wheelchair at discharge?: No Function - Locomotion: Ambulation Assistive device: Hand held assist Max distance: 180' Assist level: Touching or steadying assistance (Pt > 75%) Assist level: Touching or steadying assistance (Pt > 75%) Assist level: Touching or steadying assistance (Pt > 75%) Assist level: Touching or steadying assistance (Pt > 75%) Assist level: Touching or steadying assistance (Pt > 75%)  Function - Comprehension Comprehension: Auditory Comprehension assist level: Follows  basic conversation/direction with no assist    Function - Expression Expression:  Verbal Expression assist level: Expresses complex 90% of the time/cues < 10% of the time  Function - Social Interaction Social Interaction assist level: Interacts appropriately with others - No medications needed.  Function - Problem Solving Problem solving assist level: Solves basic problems with no assist  Function - Memory Memory assist level: Complete Independence: No helper Patient normally able to recall (first 3 days only): Current season, Location of own room, Staff names and faces, That he or she is in a hospital  Medical Problem List and Plan: 1. Right sided weakness with decreased coordination secondary to L-MCA lenticulostriate territory infarct no evidence of aphasia or apraxia 2. DVT Prophylaxis/Anticoagulation: Pharmaceutical: Lovenox,   3. Pain Management: Tylenol prn for back pain. 4. Mood: LCSW to follow for evaluation and support  5. Neuropsych: This patient is capable of making decisions on his own behalf. 6. Skin/Wound Care: Maintain adequate nutrition and support.  7. Fluids/Electrolytes/Nutrition: Monitor I/O. Check lytes in am. 8. Polysubstance abuse: Advised need for tobacco, marijuana and ETOH cessation.  10. Dyslipidemia: On Lipitor.  11. ETOH abuse: On CIWA protocol.  12. HTN: Blood pressures continue to be elevated. Will start low dose lisinopril in evenings and monitor for any hypotension. Allow BP to run high for 5-7 days post CVA to allow for adequate perfusion. monitor for symptoms of dizziness or lethargy 13. Leucocytosis: Monitor for signs of infection.   Wbc's down to 9.2  LOS (Days) 2 A FACE TO FACE EVALUATION WAS PERFORMED  Nishi Neiswonger T 01/21/2015, 8:39 AM

## 2015-01-22 ENCOUNTER — Inpatient Hospital Stay (HOSPITAL_COMMUNITY): Payer: Medicaid Other | Admitting: *Deleted

## 2015-01-22 ENCOUNTER — Inpatient Hospital Stay (HOSPITAL_COMMUNITY): Payer: Medicaid Other | Admitting: Physical Therapy

## 2015-01-22 NOTE — Progress Notes (Signed)
Subjective/Complaints: Up in chair. No new issues. Waiting for breakfast.    ROS: Pt denies fever, rash/itching, headache, blurred or double vision, nausea, vomiting, abdominal pain, diarrhea, chest pain, shortness of breath, palpitations, dysuria, dizziness, neck or back pain, bleeding, anxiety, or depression  Objective: Vital Signs: Blood pressure 132/65, pulse 57, temperature 97.5 F (36.4 C), temperature source Oral, resp. rate 17, SpO2 96 %. No results found. Results for orders placed or performed during the hospital encounter of 01/19/15 (from the past 72 hour(s))  CBC     Status: None   Collection Time: 01/20/15 11:07 AM  Result Value Ref Range   WBC 9.2 4.0 - 10.5 K/uL   RBC 4.95 4.22 - 5.81 MIL/uL   Hemoglobin 15.3 13.0 - 17.0 g/dL   HCT 45.0 39.0 - 52.0 %   MCV 90.9 78.0 - 100.0 fL   MCH 30.9 26.0 - 34.0 pg   MCHC 34.0 30.0 - 36.0 g/dL   RDW 12.7 11.5 - 15.5 %   Platelets 256 150 - 400 K/uL     HEENT: normal Cardio: RRR and no murmur Resp: CTA B/L and unlabored GI: BS positive and NT, ND Extremity:  No Edema Skin:   Intact Neuro: Alert/Oriented, Cranial Nerve II-XII normal, Normal Sensory, Abnormal Motor 4/5 in R Delt, Bi, tri, grip, HF, KE 3- ADF and Abnormal FMC Ataxic/ dec FMC Musc/Skel:  Other no pain with UE or LE ROM Gen NAD   Assessment/Plan: 1. Functional deficits secondary to Left MCA with RIght hemiparesis which require 3+ hours per day of interdisciplinary therapy in a comprehensive inpatient rehab setting. Physiatrist is providing close team supervision and 24 hour management of active medical problems listed below. Physiatrist and rehab team continue to assess barriers to discharge/monitor patient progress toward functional and medical goals. FIM: Function - Bathing Position: Shower Body parts bathed by patient: Right arm, Left arm, Chest, Abdomen, Front perineal area, Buttocks, Right upper leg, Left upper leg, Right lower leg, Left lower leg,  Back Assist Level: Supervision or verbal cues  Function- Upper Body Dressing/Undressing What is the patient wearing?: Hospital gown Assist Level: Supervision or verbal cues, Set up Function - Lower Body Dressing/Undressing What is the patient wearing?: Underwear, Non-skid slipper socks Position: Wheelchair/chair at sink Underwear - Performed by patient: Thread/unthread right underwear leg, Thread/unthread left underwear leg, Pull underwear up/down Non-skid slipper socks- Performed by patient: Don/doff right sock, Don/doff left sock Assist for lower body dressing: Supervision or verbal cues, Set up Set up : To obtain clothing/put away  Function - Toileting Toileting steps completed by patient: Adjust clothing prior to toileting, Performs perineal hygiene, Adjust clothing after toileting Toileting Assistive Devices: Grab bar or rail Assist level: Supervision or verbal cues  Function - Air cabin crew transfer assistive device: Grab bar Assist level to toilet: Supervision or verbal cues Assist level from toilet: Supervision or verbal cues  Function - Chair/bed transfer Chair/bed transfer method: Ambulatory Chair/bed transfer assist level: Touching or steadying assistance (Pt > 75%) Chair/bed transfer assistive device: Armrests Chair/bed transfer details: Verbal cues for precautions/safety  Function - Locomotion: Wheelchair Will patient use wheelchair at discharge?: No Function - Locomotion: Ambulation Assistive device: No device Max distance: 400 Assist level: Touching or steadying assistance (Pt > 75%) Assist level: Touching or steadying assistance (Pt > 75%) Assist level: Touching or steadying assistance (Pt > 75%) Assist level: Touching or steadying assistance (Pt > 75%) Assist level: Touching or steadying assistance (Pt > 75%)  Function - Comprehension Comprehension:  Auditory Comprehension assist level: Follows basic conversation/direction with no assist     Function - Expression Expression: Verbal Expression assist level: Expresses complex 90% of the time/cues < 10% of the time  Function - Social Interaction Social Interaction assist level: Interacts appropriately with others - No medications needed.  Function - Problem Solving Problem solving assist level: Solves basic problems with no assist  Function - Memory Memory assist level: Complete Independence: No helper Patient normally able to recall (first 3 days only): Current season, Location of own room, Staff names and faces, That he or she is in a hospital  Medical Problem List and Plan: 1. Right sided weakness with decreased coordination secondary to L-MCA lenticulostriate territory infarct no evidence of aphasia or apraxia 2. DVT Prophylaxis/Anticoagulation: Pharmaceutical: Lovenox,   3. Pain Management: Tylenol prn for back pain. 4. Mood: LCSW to follow for evaluation and support  5. Neuropsych: This patient is capable of making decisions on his own behalf. 6. Skin/Wound Care: Maintain adequate nutrition and support.  7. Fluids/Electrolytes/Nutrition: Monitor I/O. Check lytes in am. 8. Polysubstance abuse: Advised need for tobacco, marijuana and ETOH cessation.  10. Dyslipidemia: On Lipitor.  11. ETOH abuse: On CIWA protocol.  12. HTN: Blood pressures continue to be elevated. Will start low dose lisinopril in evenings and monitor for any hypotension. "permissible hypertension" to some extent given recent cva 13. Leucocytosis: Monitor for signs of infection.   Wbc's down to 9.2  LOS (Days) 3 A FACE TO FACE EVALUATION WAS PERFORMED  Belynda Pagaduan T 01/22/2015, 8:54 AM

## 2015-01-22 NOTE — Progress Notes (Signed)
Physical Therapy Session Note  Patient Details  Name: Michael Davenport MRN: [id number] Date of Birth: [birth year]  Today's Date: 01/22/2015 PT Individual Time: 1400-1430 PT Individual Time Calculation (min): 30 min   Short Term Goals: Week 1:  PT Short Term Goal 1 (Week 1): STGs = LTGs due to ELOS  Skilled Therapeutic Interventions/Progress Updates:   Session focused on RLE NMR and community ambulation without AD. Patient ambulated from room throughout rehab unit, on/off elevators, over thresholds, and outdoors x 1000 ft with supervision and max verbal/visual cues for R heel strike with good carryover throughout session. In gym, negotiated up/down 12 (6") stairs without rails to challenge dynamic standing balance and strength with reciprocal and step-to pattern with min A overall. Patient ambulated back to room while kicking yoga block with RLE only with continued focus on balance and R heel strike for equal stride length and fluidity of gait pattern. Patient left sitting in recliner with all needs within reach.    Therapy Documentation Precautions:  Precautions Precautions: Fall Precaution Comments: R side weakness Restrictions Weight Bearing Restrictions: No Vital Signs: Therapy Vitals Temp: 98.2 F (36.8 C) Temp Source: Oral Pulse Rate: 76 Resp: 18 BP: (!) 161/90 mmHg Patient Position (if appropriate): Sitting Oxygen Therapy SpO2: 95 % O2 Device: Not Delivered Pain: Pain Assessment Pain Assessment: No/denies pain   See Function Navigator for Current Functional Status.   Therapy/Group: Individual Therapy  Laretta Alstrom 01/22/2015, 3:46 PM

## 2015-01-22 NOTE — Progress Notes (Signed)
Physical Therapy Session Note  Patient Details  Name: Michael Davenport MRN: [id number] Date of Birth: October 09, 1960  Today's Date: 01/22/2015 PT Individual Time: [id number] PT Individual Time Calculation (min): 32 min   Short Term Goals: Week 1:  PT Short Term Goal 1 (Week 1): STGs = LTGs due to ELOS  Skilled Therapeutic Interventions/Progress Updates:    Tx focused on functional mobility training, gait without device, and NMR via forced use, manual facilitation, and multi-modal cues for fall risk reduction and coordination. Pt up in chair, ready to go. BP monitored and high throughout following each activity, RN made aware.   Pt performed gait in controlled setting with various multi-tasking cogntiive challenges including counting by 7's, 3x150'. Pt needed min cues for cognitive tasks.   High-level gait for increased coordination over obstacles course, floor ladder in multiple directions, and retro walking. Pt had most difficulty coordinating timing and accuracy of RLE placement.   Nustep x6 min with RUE and bil LEs at >60 spm for increased coordination challenge.   Pt left up in recliner with all needs in reach.   Therapy Documentation Precautions:  Precautions Precautions: Fall Precaution Comments: R side weakness Restrictions Weight Bearing Restrictions: No   Pain: none     See Function Navigator for Current Functional Status.   Therapy/Group: Individual Therapy  Tarick Parenteau, Corinna Lines, PT, DPT  01/22/2015, 1:16 PM

## 2015-01-23 ENCOUNTER — Encounter (HOSPITAL_COMMUNITY): Payer: Self-pay | Admitting: Physical Medicine & Rehabilitation

## 2015-01-23 ENCOUNTER — Inpatient Hospital Stay (HOSPITAL_COMMUNITY): Payer: Medicaid Other | Admitting: Physical Therapy

## 2015-01-23 ENCOUNTER — Inpatient Hospital Stay (HOSPITAL_COMMUNITY): Payer: Medicaid Other | Admitting: Occupational Therapy

## 2015-01-23 DIAGNOSIS — F17200 Nicotine dependence, unspecified, uncomplicated: Secondary | ICD-10-CM | POA: Diagnosis present

## 2015-01-23 DIAGNOSIS — I10 Essential (primary) hypertension: Secondary | ICD-10-CM

## 2015-01-23 DIAGNOSIS — F1010 Alcohol abuse, uncomplicated: Secondary | ICD-10-CM

## 2015-01-23 HISTORY — DX: Alcohol abuse, uncomplicated: F10.10

## 2015-01-23 NOTE — Plan of Care (Signed)
Problem: RH Ambulation Goal: LTG Patient will ambulate in controlled environment (PT) LTG: Patient will ambulate in a controlled environment, # of feet with assistance (PT).  Downgraded d/t safety concerns Goal: LTG Patient will ambulate in home environment (PT) LTG: Patient will ambulate in home environment, # of feet with assistance (PT).  Downgraded for safety

## 2015-01-23 NOTE — Progress Notes (Signed)
Patient information reviewed and entered into [facility] system by Akeila Lana, RN, CRRN, PPS Coordinator.  Information including medical coding and functional independence measure will be reviewed and updated through discharge.    

## 2015-01-23 NOTE — Progress Notes (Signed)
Recreational Therapy Session Note  Patient Details  Name: Michael Davenport MRN: [id number] Date of Birth: 08/01/1960 Today's Date: 01/23/2015  Order received and chart reviewed.  Pt with anticipated discharge date of Wednesday, TR eval deferred. [place] 01/23/2015, 4:00 PM

## 2015-01-23 NOTE — Progress Notes (Signed)
Physical Therapy Session Note  Patient Details  Name: Michael Davenport MRN: [id number] Date of Birth: Jun 04, 1960  Today's Date: 01/23/2015 PT Individual Time: 1000-1100 and 1500-1528 PT Individual Time Calculation (min): 60 min and 28 min (88 min total)  Short Term Goals: Week 1:  PT Short Term Goal 1 (Week 1): STGs = LTGs due to ELOS  Skilled Therapeutic Interventions/Progress Updates:    1000-1100: Pt received seated in recliner, no c/o pain and agreeable to treatment. Ambulation without AD to ADL apartment with S x200'. In ADL kitchen, pt performed dynamic gait and balance with cognitive dual task while retrieving items from cabinets, mixing ingredients for brownies. Min verbal cues for sequencing with pt forgetting to retrieve pan, pre-heat oven. No evidence of fatigue throughout session. Stair training x24 6-inch height steps with 1 handrail and S. Verbal cues for R foot clearance and safety. Educated pt on safety after a fall, when it is safe to get up alone and when emergency assistance is needed, and problem solving strategies to utilize sturdy furniture to A with transfer. Performed floor transfer x1 trial with mod I, no cues needed for safety or problem solving. Returned to room with ambulation as above. Remained seated in recliner at completion of session, all needs within reach.   [id number]: Pt received up in bathroom, pt made mod I in his room earlier by occupational therapist. Gait >1000' with S in community setting including gift shop, outdoors on uneven surfaces, inclines/declines, stairs. One minor LOB while in gift shop while looking at flowers, however recovered without assist. Engaged in conversation throughout for cognitive dual task to challenge balance; no evidence of instability or decreased speed with dual task. Discussed increased stroke risk with pt, educated regarding energy conservation and RLE awareness with fatigue. Pt able to list risk factors and lifestyle changes to  reduce risk, also able to lit signs/symptoms of stroke with min cues. Remained seated in recliner at completion of session, all needs within reach.    Therapy Documentation Precautions:  Precautions Precautions: Fall Precaution Comments: R side weakness Restrictions Weight Bearing Restrictions: No Pain: Pain Assessment Pain Assessment: No/denies pain Pain Score: 0-No pain   See Function Navigator for Current Functional Status.   Therapy/Group: Individual Therapy  Luberta Mutter 01/23/2015, 12:24 PM

## 2015-01-23 NOTE — Progress Notes (Signed)
Subjective/Complaints: Seen and examined this AM sitting up in his recliner awaiting therapies. He states he had a good night last night..    ROS: Denies CP, SOB, N/V/D  Objective: Vital Signs: Blood pressure 147/82, pulse 67, temperature 98.3 F (36.8 C), temperature source Oral, resp. rate 18, SpO2 99 %. No results found. Results for orders placed or performed during the hospital encounter of 01/19/15 (from the past 72 hour(s))  CBC     Status: None   Collection Time: 01/20/15 11:07 AM  Result Value Ref Range   WBC 9.2 4.0 - 10.5 K/uL   RBC 4.95 4.22 - 5.81 MIL/uL   Hemoglobin 15.3 13.0 - 17.0 g/dL   HCT 45.0 39.0 - 52.0 %   MCV 90.9 78.0 - 100.0 fL   MCH 30.9 26.0 - 34.0 pg   MCHC 34.0 30.0 - 36.0 g/dL   RDW 12.7 11.5 - 15.5 %   Platelets 256 150 - 400 K/uL  BP 147/82 mmHg  Pulse 67  Temp(Src) 98.3 F (36.8 C) (Oral)  Resp 18  SpO2 99% Gen NAD. Vital signs reviewed HEENT: Normocephalic, atraumatic Cardio: RRR and no murmur Resp: CTA B/L and unlabored GI: BS positive and NT, ND Musc/Skel:  PROM WNL. No Edema Neuro: Alert/Oriented,  Motor:  RUE: 4+/5 proximal to distal. Ataxia  RLE: 4+ proxmial to distal.   Sensation intact to light touch Skin:   Intact. Warm and dry  Assessment/Plan: 1. Functional deficits secondary to Left MCA with RIght hemiparesis which require 3+ hours per day of interdisciplinary therapy in a comprehensive inpatient rehab setting. Physiatrist is providing close team supervision and 24 hour management of active medical problems listed below. Physiatrist and rehab team continue to assess barriers to discharge/monitor patient progress toward functional and medical goals. FIM: Function - Bathing Position: Shower Body parts bathed by patient: Right arm, Left arm, Chest, Abdomen, Front perineal area, Buttocks, Right upper leg, Left upper leg, Right lower leg, Left lower leg, Back Assist Level: Supervision or verbal cues  Function- Upper Body  Dressing/Undressing What is the patient wearing?: Hospital gown Assist Level: Supervision or verbal cues, Set up Function - Lower Body Dressing/Undressing What is the patient wearing?: Underwear, Non-skid slipper socks Position: Wheelchair/chair at sink Underwear - Performed by patient: Thread/unthread right underwear leg, Thread/unthread left underwear leg, Pull underwear up/down Non-skid slipper socks- Performed by patient: Don/doff right sock, Don/doff left sock Assist for lower body dressing: Supervision or verbal cues, Set up Set up : To obtain clothing/put away  Function - Toileting Toileting steps completed by patient: Adjust clothing prior to toileting, Performs perineal hygiene, Adjust clothing after toileting Toileting Assistive Devices: Grab bar or rail Assist level: Supervision or verbal cues  Function - Air cabin crew transfer assistive device: Grab bar Assist level to toilet: Supervision or verbal cues Assist level from toilet: Supervision or verbal cues  Function - Chair/bed transfer Chair/bed transfer method: Ambulatory Chair/bed transfer assist level: Supervision or verbal cues Chair/bed transfer assistive device: Armrests Chair/bed transfer details: Verbal cues for precautions/safety  Function - Locomotion: Wheelchair Will patient use wheelchair at discharge?: No Function - Locomotion: Ambulation Assistive device: No device Max distance: 500 Assist level: Supervision or verbal cues Assist level: Supervision or verbal cues Assist level: Supervision or verbal cues Assist level: Supervision or verbal cues Assist level: Touching or steadying assistance (Pt > 75%)  Function - Comprehension Comprehension: Auditory Comprehension assist level: Follows basic conversation/direction with no assist    Function - Expression Expression: Verbal  Expression assist level: Expresses complex 90% of the time/cues < 10% of the time  Function - Social  Interaction Social Interaction assist level: Interacts appropriately with others - No medications needed.  Function - Problem Solving Problem solving assist level: Solves basic problems with no assist  Function - Memory Memory assist level: Complete Independence: No helper Patient normally able to recall (first 3 days only): Current season, Location of own room, Staff names and faces, That he or she is in a hospital  Medical Problem List and Plan: 1. Right sided weakness with decreased coordination secondary to L-MCA lenticulostriate territory infarct no evidence of aphasia or apraxia  Cont CIR 2. DVT Prophylaxis/Anticoagulation: Pharmaceutical: Lovenox,   3. Pain Management: Tylenol prn for back pain. 4. Mood: LCSW to follow for evaluation and support  5. Neuropsych: This patient is capable of making decisions on his own behalf. 6. Skin/Wound Care: Maintain adequate nutrition and support.  7. Fluids/Electrolytes/Nutrition: Monitor I/O.  8. Polysubstance abuse: Counseling for tobacco, marijuana and ETOH cessation.  10. Dyslipidemia: On Lipitor.  11. ETOH abuse: On CIWA protocol.  12. HTN: Blood pressures improving. Will consider increase in medication if pressure don't continue to improve  13. Leucocytosis: Resolved  LOS (Days) 4 A FACE TO FACE EVALUATION WAS PERFORMED  Shana Zavaleta Lorie Phenix 01/23/2015, 10:10 AM

## 2015-01-23 NOTE — Progress Notes (Signed)
Occupational Therapy Session Note  Patient Details  Name: Michael Davenport MRN: [id number] Date of Birth: 1960/05/01  Today's Date: 01/23/2015 OT Individual Time: [id number] and [id number] OT Individual Time Calculation (min): 60 min and 58 min   Short Term Goals: Week 1:  OT Short Term Goal 1 (Week 1): STG = LTGs due to short ELOS  Skilled Therapeutic Interventions/Progress Updates:   1) Treatment session with focus on RUE/RLE NMR, home making tasks, functional mobility, and overall activity tolerance.  Pt reports already dressing prior to session.  Ambulated to ADL apt, completed tub/shower transfers stepping over tub ledge with supervision.  Discussed home bathroom setup and DME, plan to engage in bathing at standing level in shower during next AM session.  Pt completed vacuuming at Mod I level, as pt reports completing most household tasks.  Engaged in quadruped activity with focus on weight bearing through [place], followed by gross motor and fine motor control activity.  Pt with improved RUE motor control and precision with movements.  Engaged in dynamic standing activity with focus on weight bearing through RLE followed by motor control with RLE with tapping out address on 4" step.  Nustep for 8 mins on resistance level 6 with focus on RUE and RLE motor control and strengthening.  Pt ambulated back to room at Mod I level and left upright in recliner.  2) Treatment session with focus on functional mobility in home environment and simple meal prep.  Pt ambulated to ADL apt at Mod I level.  Obtained items from cabinets and refrigerator and cut a block of cheese for patient holiday party.  Discussed safety risks in the kitchen with dominant RUE ataxia (very mild) and weakness.  Pt washed dishes in standing at Mod I level.  Engaged in [place] fine motor and gross motor control with focus on motor control with placing toothpicks into a straw, picking up toothpicks from table, and in-hand manipulation with small  beads.  Provided pt with [facility] HEP and beads for theraputty to increase strength and coordination.    Therapy Documentation Precautions:  Precautions Precautions: Fall Precaution Comments: R side weakness Restrictions Weight Bearing Restrictions: No General:   Vital Signs: Therapy Vitals Pulse Rate: 67 BP: (!) 147/82 mmHg Patient Position (if appropriate): Sitting Pain:  Pt with no c/o pain  See Function Navigator for Current Functional Status.   Therapy/Group: Individual Therapy  [name] 01/23/2015, 9:54 AM

## 2015-01-23 NOTE — Progress Notes (Signed)
Social Work Patient ID: Michael Davenport, male   DOB: May 04, 1960, 54 y.o.   MRN: [id number] [facility] provide [facility] care to uninsured patients. Appointment Jan 16 @ 2:15 pm. Informed pt to make transportation arrangements. He will need a PCP to follow him now.

## 2015-01-23 NOTE — Progress Notes (Signed)
Social Work Patient ID: Michael Davenport, male   DOB: 10/29/1960, 54 y.o.   MRN: [id number] Team feels pt will be ready for discharge on Wed, will have met his goals by then. MD and PA aware and report  Medically readiness for discharge Wed. Pt is pleased with this plan.

## 2015-01-24 ENCOUNTER — Inpatient Hospital Stay (HOSPITAL_COMMUNITY): Payer: Medicaid Other | Admitting: Occupational Therapy

## 2015-01-24 ENCOUNTER — Inpatient Hospital Stay (HOSPITAL_COMMUNITY): Payer: Medicaid Other | Admitting: Physical Therapy

## 2015-01-24 DIAGNOSIS — R030 Elevated blood-pressure reading, without diagnosis of hypertension: Secondary | ICD-10-CM

## 2015-01-24 NOTE — Progress Notes (Signed)
Physical Therapy Discharge Summary  Patient Details  Name: Michael Davenport MRN: [id number] Date of Birth: 1960/08/27  Today's Date: 01/24/2015 PT Individual Time: 1000-1045 and 1400-1430 PT Individual Time Calculation (min): 45 min and 30 min (total 75 min)    Patient has met 11 of 11 long term goals due to improved activity tolerance, improved balance, improved postural control, functional use of  right upper extremity and right lower extremity, improved awareness and improved coordination.  Patient to discharge at an ambulatory level Modified Independent.   Patient to d/c at mod I level; will not require assistance of family members upon returning home. Education regarding stroke risk, physical activity, medical compliance all performed throughout stay on rehab; pt demonstrates understanding of all the above.  Reasons goals not met: All goals met  Recommendation:  Patient would benefit from ongoing skilled PT services in outpatient setting to continue to advance safe functional mobility, address ongoing impairments in balance, strength, coordination, and minimize fall risk.  Equipment: No equipment provided  Reasons for discharge: treatment goals met and discharge from hospital  Patient/family agrees with progress made and goals achieved: Yes  PT Discharge Precautions/Restrictions Precautions Precautions: Fall Restrictions Weight Bearing Restrictions: No Pain Pain Assessment Pain Assessment: No/denies pain Pain Score: 0-No pain Cognition Overall Cognitive Status: Within Functional Limits for tasks assessed Arousal/Alertness: Awake/alert Orientation Level: Oriented X4 Attention: Selective Focused Attention: Appears intact Sustained Attention: Appears intact Selective Attention: Appears intact Memory: Appears intact Awareness: Appears intact Problem Solving: Appears intact Safety/Judgment: Appears intact Sensation Sensation Light Touch: Appears Intact Stereognosis:  Appears Intact Hot/Cold: Appears Intact Proprioception: Appears Intact Coordination Fine Motor Movements are Fluid and Coordinated: No (mild impairment on Rt) Finger Nose Finger Test: mild impairment on Rt side with slight ataxia at endpoint; WFL on Lt side 9 Hole Peg Test: Rt: 1:01 min, Lt: 30 seconds on eval; Rt: 31 seconds and Lt: 27 seconds on discharge Motor  Motor Motor: Ataxia;Hemiplegia Motor - Discharge Observations: mild R sided hemiparesis, mild RUE ataxia  Mobility Bed Mobility Bed Mobility: Rolling Right;Rolling Left;Supine to Sit;Sit to Supine Rolling Right: 7: Independent Rolling Left: 7: Independent Supine to Sit: 7: Independent;HOB flat Sit to Supine: 7: Independent;HOB flat Transfers Transfers: Yes Sit to Stand: 6: Modified independent (Device/Increase time);From chair/3-in-1;From bed;Without upper extremity assist Stand to Sit: 6: Modified independent (Device/Increase time);Without upper extremity assist;To bed;To chair/3-in-1 Stand Pivot Transfers: 6: Modified independent (Device/Increase time) Locomotion  Ambulation Ambulation: Yes Ambulation/Gait Assistance: 6: Modified independent (Device/Increase time) Ambulation Distance (Feet): 300 Feet Assistive device: None Gait Gait: Yes Gait Pattern: Impaired Gait Pattern: Poor foot clearance - right;Decreased dorsiflexion - right;Decreased weight shift to right Gait velocity: decreased for age/gender norms High Level Ambulation High Level Ambulation: Side stepping;Backwards walking;Sudden stops Side Stepping: mod I Backwards Walking: mod I Sudden Stops: mod I Stairs / Additional Locomotion Stairs: Yes Stairs Assistance: 6: Modified independent (Device/Increase time) Stair Management Technique: One rail Left;Alternating pattern;Forwards Number of Stairs: 12 Height of Stairs: 6 Curb: 6: Modified independent (Device/increase time) Wheelchair Mobility Wheelchair Mobility: No  Trunk/Postural Assessment   Cervical Assessment Cervical Assessment: Within Functional Limits Thoracic Assessment Thoracic Assessment: Within Functional Limits Lumbar Assessment Lumbar Assessment: Within Functional Limits Postural Control Postural Control: Within Functional Limits  Balance Balance Balance Assessed: Yes Standardized Balance Assessment Standardized Balance Assessment: Berg Balance Test Berg Balance Test Sit to Stand: Able to stand without using hands and stabilize independently Standing Unsupported: Able to stand safely 2 minutes Sitting with Back Unsupported but Feet Supported on  Floor or Stool: Able to sit safely and securely 2 minutes Stand to Sit: Sits safely with minimal use of hands Transfers: Able to transfer safely, minor use of hands Standing Unsupported with Eyes Closed: Able to stand 10 seconds safely Standing Ubsupported with Feet Together: Able to place feet together independently and stand 1 minute safely From Standing, Reach Forward with Outstretched Arm: Can reach confidently >25 cm (10") From Standing Position, Pick up Object from Floor: Able to pick up shoe safely and easily From Standing Position, Turn to Look Behind Over each Shoulder: Looks behind from both sides and weight shifts well Turn 360 Degrees: Able to turn 360 degrees safely in 4 seconds or less Standing Unsupported, Alternately Place Feet on Step/Stool: Able to stand independently and safely and complete 8 steps in 20 seconds Standing Unsupported, One Foot in Front: Able to plae foot ahead of the other independently and hold 30 seconds Standing on One Leg: Able to lift leg independently and hold 5-10 seconds Total Score: 54 Static Sitting Balance Static Sitting - Balance Support: Feet supported;No upper extremity supported Static Sitting - Level of Assistance: 7: Independent Dynamic Sitting Balance Dynamic Sitting - Balance Support: Feet supported;No upper extremity supported Dynamic Sitting - Level of  Assistance: 7: Independent Static Standing Balance Static Standing - Balance Support: During functional activity;No upper extremity supported Static Standing - Level of Assistance: 6: Modified independent (Device/Increase time) Static Stance: Eyes closed Static Stance: Eyes Closed: mod I x30 sec Dynamic Standing Balance Dynamic Standing - Balance Support: During functional activity;No upper extremity supported Dynamic Standing - Level of Assistance: 6: Modified independent (Device/Increase time) Dynamic Standing - Balance Activities: Forward lean/weight shifting;Lateral lean/weight shifting;Reaching for objects;Reaching across midline Extremity Assessment  RUE Assessment RUE Assessment: Within Functional Limits RUE AROM (degrees) Overall AROM Right Upper Extremity: Within functional limits for tasks performed RUE Strength RUE Overall Strength: Within Functional Limits for tasks performed LUE Assessment LUE Assessment: Within Functional Limits RLE Assessment RLE Assessment: Exceptions to WFL RLE AROM (degrees) Overall AROM Right Lower Extremity: Within functional limits for tasks assessed RLE Strength RLE Overall Strength: Deficits RLE Overall Strength Comments: 4+/5 to 5/5 throughout LLE Assessment LLE Assessment: Within Functional Limits  Skilled Therapeutic Intervention: Tx 1: Pt received seated in recliner, no c/o pain and agreeable to treatment. Gait to ADL kitchen with modI. Performed dynamic standing balance in kitchen while baking cookies; min verbal cues for sequencing. Performed to address balance with cognitive dual task, bimanual coordination and forced use of RUE, activity tolerance. Performed car transfer, bed mobility, furniture transfer, stairs x12 6-inch height, and gait on uneven surface with mod I. Performed [place] A exercises as pt unlikely to receive follow up therapy. Educated regarding importance of performing standing activities at sink or counter with UE support  for stability. Verbal cues and demonstration initially for each exercise. Returned to room with mod I, and remained seated in recliner with all needs in reach at completion of session.   Tx 2: Pt received seated in recliner, no c/o pain and agreeable to treatment. Completed performance of [place] A, B, and C exercises with education regarding progression of activities as able with increasing reps/sets, and complexity of exercises. Assessed Berg balance scale with significant improvement over eval. Patient demonstrates low fall risk as noted by score of   45/56 on Berg Balance Scale.  (<36= high risk for falls, close to 100%; 37-45 significant >80%; 46-51 moderate >50%; 52-55 lower >25%). Pt ambulated to day room, and remained seated   at table for holiday party at completion of session.    See Function Navigator for Current Functional Status.  Elizabeth J Tygielski 01/24/2015, 4:28 PM   

## 2015-01-24 NOTE — Progress Notes (Signed)
Social Work Patient ID: Michael Davenport, male   DOB: [birth year], 54 y.o.   MRN: [id number] Met with pt to answer his questions regarding discharge tomorrow. Will get him connected to the [facility] program to assist with his medications and have already arranged [facility]. He is agreeable he does not need a handicapped sticker, he is ambulating too far, his friend wanted to look into this. Discussed follow up he can not get a ride to OP so will look into home health  Follow up and see if can see short time. He is agreeable and is ready to go home.

## 2015-01-24 NOTE — Patient Care Conference (Signed)
Inpatient RehabilitationTeam Conference and Plan of Care Update Date: 01/25/2015   Time: 1:30 PM    Patient Name: Michael Davenport      Medical Record Number: [id number]  Date of Birth: 05-23-1960 Sex: Male         Room/Bed: 4W25C/4W25C-01 Payor Info: Payor: /    Admitting Diagnosis: CVA  Admit Date/Time:  01/19/2015  1:23 PM Admission Comments: No comment available   Primary Diagnosis:  Stroke due to embolism of right middle cerebral artery (HCC) Principal Problem: Stroke due to embolism of right middle cerebral artery [facility])  Patient Active Problem List   Diagnosis Date Noted  . ETOH abuse 01/23/2015  . Tobacco use disorder 01/23/2015  . Stroke due to embolism of right middle cerebral artery ([place]) 01/19/2015  . HTN (hypertension) 01/18/2015  . TIA (transient ischemic attack) 01/17/2015  . Alcohol abuse 01/17/2015  . Tobacco abuse 01/17/2015  . Elevated BP 01/17/2015  . Acute CVA (cerebrovascular accident) ([place]) 01/17/2015  . Dyslipidemia     Expected Discharge Date: Expected Discharge Date: 01/25/15  Team Members Present: Physician leading conference: Dr. Delice Lesch Social Worker Present: Ovidio Kin, LCSW Nurse Present: Heather Roberts, RN PT Present: Kem Parkinson, PT OT Present: [name], OT SLP Present: Windell Moulding, SLP PPS Coordinator present : Daiva Nakayama, RN, CRRN     Current Status/Progress Goal Weekly Team Focus  Medical     medical stable BP stable   medical stability-ready for DC     Bowel/Bladder   Continent of bowel and bladder; LBM 12/19 cont B & B Mod I Cont B & B Assess and treat for constipation as needed   Swallow/Nutrition/ Hydration     na        ADL's     mod/i home management, ADL's and meal preparation   mod/i   education,home management, meal preparation  Mobility   mod I overall, S community ambulation  modI overall, S community ambulation  HEP, education in preparation for d/c   Communication     na        Safety/Cognition/  Behavioral Observations    no unsafe behaviors        Pain   Denies pain  < 3  Assess and treat for pain q shift and prn   Skin   Skin intact Mod I  Assess skin q shift and prn      *See Care Plan and progress notes for long and short-term goals.  Barriers to Discharge:   No medical follow up-PCP   Possible Resolutions to Barriers:    [facility]   Discharge Planning/Teaching Needs:    Home with friend who works during the day but is home in the evenings. He needs to be mod/i level     Team Discussion:  Pt reached mod/i level and is ready for discharge today. BP stable and no medical issues.   Revisions to Treatment Plan:  DC today-none      Elease Hashimoto 01/25/2015, 11:22 AM

## 2015-01-24 NOTE — Progress Notes (Signed)
Subjective/Complaints: Seen and examined this AM sitting up in his recliner just having gotten out of the shower.  He states he had a good night last night and is looking forward to going home tomorrow.   ROS: Denies CP, SOB, N/V/D  Objective: Vital Signs: Blood pressure 145/90, pulse 59, temperature 98 F (36.7 C), temperature source Oral, resp. rate 18, SpO2 100 %. No results found. No results found for this or any previous visit (from the past 72 hour(s)).BP 145/90 mmHg  Pulse 59  Temp(Src) 98 F (36.7 C) (Oral)  Resp 18  SpO2 100% Gen NAD. Vital signs reviewed HEENT: Normocephalic, atraumatic Cardio: RRR and no murmur Resp: CTA B/L and unlabored GI: BS positive and NT, ND Musc/Skel:  PROM WNL. No Edema Neuro: Alert/Oriented,  Motor:  RUE: 4+/5 proximal to distal. Ataxia.  No dysdiadochokinesia  RLE: 4+ proxmial to distal.   Sensation intact to light touch Skin:   Intact. Warm and dry  Assessment/Plan: 1. Functional deficits secondary to Left MCA with RIght hemiparesis which require 3+ hours per day of interdisciplinary therapy in a comprehensive inpatient rehab setting. Physiatrist is providing close team supervision and 24 hour management of active medical problems listed below. Physiatrist and rehab team continue to assess barriers to discharge/monitor patient progress toward functional and medical goals. FIM: Function - Bathing Position: Shower Body parts bathed by patient: Right arm, Left arm, Chest, Abdomen, Front perineal area, Buttocks, Right upper leg, Left upper leg, Right lower leg, Left lower leg, Back Assist Level: No help, No cues (in standing)  Function- Upper Body Dressing/Undressing What is the patient wearing?: Pull over shirt/dress Pull over shirt/dress - Perfomed by patient: Thread/unthread right sleeve, Thread/unthread left sleeve, Put head through opening, Pull shirt over trunk Assist Level: No help, No cues Function - Lower Body  Dressing/Undressing What is the patient wearing?: Underwear, Socks, Shoes, Pants Position:  (standing for underwear, sit > stand for pants, socks, and shoes) Underwear - Performed by patient: Thread/unthread right underwear leg, Thread/unthread left underwear leg, Pull underwear up/down Pants- Performed by patient: Thread/unthread right pants leg, Thread/unthread left pants leg, Pull pants up/down Non-skid slipper socks- Performed by patient: Don/doff right sock, Don/doff left sock Socks - Performed by patient: Don/doff right sock, Don/doff left sock Shoes - Performed by patient: Don/doff right shoe, Don/doff left shoe, Fasten right, Fasten left Assist for lower body dressing: More than reasonable time Set up : To obtain clothing/put away  Function - Toileting Toileting steps completed by patient: Adjust clothing prior to toileting, Performs perineal hygiene, Adjust clothing after toileting Toileting Assistive Devices: Grab bar or rail Assist level: More than reasonable time  Function - Air cabin crew transfer assistive device: Grab bar Assist level to toilet: No Help, no cues, assistive device, takes more than a reasonable amount of time Assist level from toilet: No Help, no cues, assistive device, takes more than a reasonable amount of time  Function - Chair/bed transfer Chair/bed transfer method: Ambulatory Chair/bed transfer assist level: Supervision or verbal cues Chair/bed transfer assistive device: Armrests Chair/bed transfer details: Verbal cues for precautions/safety  Function - Locomotion: Wheelchair Will patient use wheelchair at discharge?: No Function - Locomotion: Ambulation Assistive device: No device Max distance: 200 Assist level: Supervision or verbal cues Assist level: Supervision or verbal cues Assist level: Supervision or verbal cues Assist level: Supervision or verbal cues Assist level: Touching or steadying assistance (Pt > 75%)  Function -  Comprehension Comprehension: Auditory Comprehension assist level: Follows basic conversation/direction with  no assist    Function - Expression Expression: Verbal Expression assist level: Expresses complex 90% of the time/cues < 10% of the time  Function - Social Interaction Social Interaction assist level: Interacts appropriately with others - No medications needed.  Function - Problem Solving Problem solving assist level: Solves basic problems with no assist  Function - Memory Memory assist level: Complete Independence: No helper Patient normally able to recall (first 3 days only): Current season, Location of own room, Staff names and faces, That he or she is in a hospital  Medical Problem List and Plan: 1. Right sided weakness with decreased coordination secondary to L-MCA lenticulostriate territory infarct no evidence of aphasia or apraxia  Cont CIR  Likely d/c tomorrow 2. DVT Prophylaxis/Anticoagulation: Pharmaceutical: Lovenox,   3. Pain Management: Tylenol prn for back pain. 4. Mood: LCSW to follow for evaluation and support  5. Neuropsych: This patient is capable of making decisions on his own behalf. 6. Skin/Wound Care: Maintain adequate nutrition and support.  7. Fluids/Electrolytes/Nutrition: Monitor I/O.  8. Polysubstance abuse: Counseling for tobacco, marijuana and ETOH cessation.  10. Dyslipidemia: On Lipitor.  11. ETOH abuse: On CIWA protocol.  12. HTN: Blood pressures cont to improve  13. Leucocytosis: Resolved  LOS (Days) 5 A FACE TO FACE EVALUATION WAS PERFORMED  Imo Cumbie Lorie Phenix 01/24/2015, 9:45 AM

## 2015-01-24 NOTE — Progress Notes (Signed)
Occupational Therapy Discharge Summary  Patient Details  Name: Michael Davenport MRN: [id number] Date of Birth: 05/30/1960  Patient has met 12 of 12 long term goals due to improved activity tolerance, improved balance, postural control, ability to compensate for deficits, functional use of  RIGHT upper and RIGHT lower extremity, improved awareness and improved coordination.  Patient to discharge at overall Modified Independent level.  Patient's care partner unavailable to provide the necessary assistance at discharge.  Pt lives with friend who works during the day, however pt is discharging at [place] level and therefore does not require assistance at discharge.    Reasons goals not met: N/A  Recommendation:  Patient will not require follow up OT at this time.  Provided with HEP handouts and exercises.  Equipment: No equipment provided  Reasons for discharge: treatment goals met and discharge from hospital  Patient/family agrees with progress made and goals achieved: Yes  OT Discharge Vital Signs Therapy Vitals Temp: 98 F (36.7 C) Temp Source: Oral Pulse Rate: (!) 59 Resp: 18 BP: (!) 145/90 mmHg Patient Position (if appropriate): Lying Oxygen Therapy SpO2: 100 % O2 Device: Not Delivered Pain Pain Assessment Pain Assessment: No/denies pain ADL  See Function Navigator Vision/Perception  Vision- History Baseline Vision/History: Wears glasses Wears Glasses: At all times Patient Visual Report: No change from baseline Vision- Assessment Vision Assessment?: No apparent visual deficits Eye Alignment: Within Functional Limits Ocular Range of Motion: Within Functional Limits Alignment/Gaze Preference: Within Defined Limits Tracking/Visual Pursuits: Able to track stimulus in all quads without difficulty Saccades: Within functional limits Visual Fields: No apparent deficits Perception Comments: WFL  Cognition Overall Cognitive Status: Within Functional  Limits for tasks assessed Arousal/Alertness: Awake/alert Orientation Level: Oriented X4 Attention: Selective Selective Attention: Appears intact Memory: Appears intact Awareness: Appears intact Problem Solving: Appears intact Safety/Judgment: Appears intact Sensation Sensation Light Touch: Appears Intact Stereognosis: Appears Intact Hot/Cold: Appears Intact Proprioception: Appears Intact Coordination Fine Motor Movements are Fluid and Coordinated: No (mild impairment on Rt) Finger Nose Finger Test: mild impairment on Rt side with slight ataxia at endpoint; WFL on Lt side 9 Hole Peg Test: Rt: 1:01 min, Lt: 30 seconds on eval; Rt: 31 seconds and Lt: 27 seconds on discharge Extremity/Trunk Assessment RUE Assessment RUE Assessment: Within Functional Limits LUE Assessment LUE Assessment: Within Functional Limits   See Function Navigator for Current Functional Status.  [name] 01/24/2015, 3:42 PM

## 2015-01-24 NOTE — Progress Notes (Signed)
Occupational Therapy Session Note  Patient Details  Name: Michael Davenport MRN: [id number] Date of Birth: August 01, 1960  Today's Date: 01/24/2015 OT Individual Time: 0730-0900 and 1335-1400 OT Individual Time Calculation (min): 90 min and 25 min    Short Term Goals: Week 1:  OT Short Term Goal 1 (Week 1): STG = LTGs due to short ELOS  Skilled Therapeutic Interventions/Progress Updates:    1) Completed ADL retraining at overall Modified independent - Independent level.  Engaged in bathing in standing to assess need for shower chair, pt completed bathing standing in shower with use of wall to steady self when washing feet in single leg stance. Therapist and pt feel that pt will be safe with bathing at standing level in shower, pt also reports that his friend has a shower seat that he could use if needed.  Dressing completed at sit > stand level.  Pt able to utilize RUE as dominant UE during grooming tasks and self-feeding.  Reiterated [name] HEP and gross motor control exercises.  Engaged in dynamic standing activity with focus on trunk control and weight shifting with use of dominant RUE.    2) Treatment session with focus on functional mobility and home management tasks.  Pt ambulated through unit while transporting items to prepare for holiday party, pt able to retrieve items of various sizes and weights from high and low surfaces and transport items without issue.  Pt wiped off tables and therapy mat, incorporating quadruped and getting in and out of position at Mod I level.  Therapy Documentation Precautions:  Precautions Precautions: Fall Precaution Comments: R side weakness Restrictions Weight Bearing Restrictions: No General:   Vital Signs: Therapy Vitals Temp: 98 F (36.7 C) Temp Source: Oral Pulse Rate: (!) 59 Resp: 18 BP: (!) 145/90 mmHg Patient Position (if appropriate): Lying Oxygen Therapy SpO2: 100 % O2 Device: Not Delivered Pain:  Pt with no c/o pain  See Function  Navigator for Current Functional Status.   Therapy/Group: Individual Therapy  [name] 01/24/2015, 8:04 AM

## 2015-01-25 DIAGNOSIS — F17200 Nicotine dependence, unspecified, uncomplicated: Secondary | ICD-10-CM

## 2015-01-25 MED ORDER — LISINOPRIL 2.5 MG PO TABS: 2.5000 mg | ORAL_TABLET | Freq: Every day | ORAL | Status: DC

## 2015-01-25 MED ORDER — THIAMINE HCL 100 MG PO TABS: 100.0000 mg | ORAL_TABLET | Freq: Every day | ORAL | Status: DC

## 2015-01-25 MED ORDER — FOLIC ACID 1 MG PO TABS: 1.0000 mg | ORAL_TABLET | Freq: Every day | ORAL | Status: DC

## 2015-01-25 MED ORDER — ASPIRIN 325 MG PO TABS: 325.0000 mg | ORAL_TABLET | Freq: Every day | ORAL | Status: DC

## 2015-01-25 MED ORDER — ADULT MULTIVITAMIN W/MINERALS CH: 1.0000 | ORAL_TABLET | Freq: Every day | ORAL | Status: AC

## 2015-01-25 MED ORDER — NICOTINE 21 MG/24HR TD PT24: 21.0000 mg | MEDICATED_PATCH | Freq: Every day | TRANSDERMAL | Status: DC

## 2015-01-25 MED ORDER — ADULT MULTIVITAMIN W/MINERALS CH: 1.0000 | ORAL_TABLET | Freq: Every day | ORAL | Status: DC

## 2015-01-25 MED ORDER — DIBUCAINE 1 % RE OINT
TOPICAL_OINTMENT | Freq: Two times a day (BID) | RECTAL | Status: DC
  Filled 2015-01-25: qty 28

## 2015-01-25 MED ORDER — ATORVASTATIN CALCIUM 40 MG PO TABS: 40.0000 mg | ORAL_TABLET | Freq: Every day | ORAL | Status: DC

## 2015-01-25 MED ORDER — ADULT MULTIVITAMIN W/MINERALS CH
1.0000 | ORAL_TABLET | Freq: Every day | ORAL | Status: DC
Start: 2015-01-25 — End: 2015-01-25

## 2015-01-25 NOTE — Discharge Instructions (Signed)
Inpatient Rehab Discharge Instructions  Michael Davenport Discharge date and time: 01/25/15   Activities/Precautions/ Functional Status: Activity: no lifting, driving, or strenuous exercise for till cleared by MD.  Diet: cardiac diet Wound Care: none needed Functional status:  ___ No restrictions     ___ Walk up steps independently ___ 24/7 supervision/assistance   ___ Walk up steps with assistance _X__ Intermittent supervision/assistance  _X__ Bathe/dress independently ___ Walk with walker     ___ Bathe/dress with assistance _X__ Walk Independently    ___ Shower independently ___ Walk with assistance    ___ Shower with assistance _X__ No alcohol     ___ Return to work/school ________   Special Instructions: 1. No tobacco. No alcohol. No marijuana.  COMMUNITY REFERRALS UPON DISCHARGE:   None-home exercise program given to patient to follow up with at home  Medical Equipment/Items Ordered:none needed   Other:Disability and Medicaid application started pt to follow up with. [facility]-[phone] and Michael Davenport [phone] Match program paperwork given to patient for assistance with medications [facility] for [facility]CP-1/16 @ 2;15 Pm  GENERAL COMMUNITY RESOURCES FOR PATIENT/FAMILY: Support Groups:[facility] Support Group Declined Substance Abuse Services  STROKE/TIA DISCHARGE INSTRUCTIONS SMOKING Cigarette smoking nearly doubles your risk of having a stroke & is the single most alterable risk factor  If you smoke or have smoked in the last 12 months, you are advised to quit smoking for your health.  Most of the excess cardiovascular risk related to smoking disappears within a year of stopping.  Ask you doctor about anti-smoking medications  [place] Quit Line: 1-800-QUIT NOW  Free Smoking Cessation Classes [phone]  CHOLESTEROL Know your levels; limit fat & cholesterol in your diet  Lipid Panel     Component Value Date/Time   CHOL  195 01/17/2015 0750   TRIG 72 01/17/2015 0750   HDL 48 01/17/2015 0750   CHOLHDL 4.1 01/17/2015 0750   VLDL 14 01/17/2015 0750   LDLCALC 133* 01/17/2015 0750      Many patients benefit from treatment even if their cholesterol is at goal.  Goal: Total Cholesterol (CHOL) less than 160  Goal:  Triglycerides (TRIG) less than 150  Goal:  HDL greater than 40  Goal:  LDL (LDLCALC) less than 100   BLOOD PRESSURE American Stroke Association blood pressure target is less that 120/80 mm/Hg  Your discharge blood pressure is:  BP: 137/90 mmHg  Monitor your blood pressure  Limit your salt and alcohol intake  Many individuals will require more than one medication for high blood pressure  DIABETES (A1c is a blood sugar average for last 3 months) Goal HGBA1c is under 7% (HBGA1c is blood sugar average for last 3 months)  Diabetes: No known diagnosis of diabetes    Lab Results  Component Value Date   HGBA1C 5.7* 01/17/2015     Your HGBA1c can be lowered with medications, healthy diet, and exercise.  Check your blood sugar as directed by your physician  Call your physician if you experience unexplained or low blood sugars.  PHYSICAL ACTIVITY/REHABILITATION Goal is 30 minutes at least 4 days per week  Activity: No driving, Therapies: Continue Home Exercise plan Return to work:  N/A  Activity decreases your risk of heart attack and stroke and makes your heart stronger.  It helps control your weight and blood pressure; helps you relax and can improve your mood.  Participate in a regular exercise program.  Talk with your doctor about the best form of  exercise for you (dancing, walking, swimming, cycling).  DIET/WEIGHT Goal is to maintain a healthy weight  Your discharge diet is: Diet Heart Room service appropriate?: Yes; Fluid consistency:: Thin  liquids Your height is:  5'6" Your current weight is:  172 Your Body Mass Index (BMI) is:  27.9   Following the type of diet specifically  designed for you will help prevent another stroke.  Your goal weight  is:  155 lbs  Your goal Body Mass Index (BMI) is 19-24.  Healthy food habits can help reduce 3 risk factors for stroke:  High cholesterol, hypertension, and excess weight.  RESOURCES Stroke/Support Group:  Call [phone]   STROKE EDUCATION PROVIDED/REVIEWED AND GIVEN TO PATIENT Stroke warning signs and symptoms How to activate emergency medical system (call 911). Medications prescribed at discharge. Need for follow-up after discharge. Personal risk factors for stroke. Pneumonia vaccine given:  Flu vaccine given:  My questions have been answered, the writing is legible, and I understand these instructions.  I will adhere to these goals & educational materials that have been provided to me after my discharge from the hospital.      My questions have been answered and I understand these instructions. I will adhere to these goals and the provided educational materials after my discharge from the hospital.  Patient/Caregiver Signature _______________________________ Date __________  Clinician Signature _______________________________________ Date __________  Please bring this form and your medication list with you to all your follow-up doctor's appointments.

## 2015-01-25 NOTE — Progress Notes (Signed)
Social Work  Discharge Note  The overall goal for the admission was met for:   Discharge location: [place]-home in the evenings  Length of Stay: Yes-7 DAYS  Discharge activity level: Yes-MOD/I-INDEPENDENT LEVEL  Home/community participation: Yes  Services provided included: MD, RD, PT, OT, RN, CM, TR, Pharmacy and SW  Financial Services: Other: PENDING MEDICAID  Follow-up services arranged: Other: HOME EXERCISE PROGRAM  Comments (or additional information):PT REACHED MOD/I LEVEL/INDEPENDENT LEVEL AND NO FOLLOW UP NEEDED OR EQUIPMENT. TO [facility] FOR [facility]CP APPOINTMENT  MADE 1/16 @ 2:15 PM. FOLLOW UP ON PENDING DISABILITY AND MEDICAID APPLICATIONS. MATCH PAPERWORK GIVEN TO PT. DECLINED SUBSTANCE ABUSE RESOURCES.  Patient/Family verbalized understanding of follow-up arrangements: Yes  Individual responsible for coordination of the follow-up plan: SELF  Confirmed correct DME delivered: Elease Hashimoto 01/25/2015    Elease Hashimoto

## 2015-01-25 NOTE — Discharge Summary (Signed)
Physician Discharge Summary  Patient ID: Michael Davenport MRN: [id number] DOB/AGE: 56-Apr-1962 56 y.o.  Admit date: 01/19/2015 Discharge date: 01/25/2015  Discharge Diagnoses:  Principal Problem:   Stroke due to embolism of right middle cerebral artery (HCC) Active Problems:   Alcohol abuse   Elevated BP   ETOH abuse   Tobacco use disorder   Discharged Condition: stable.    Labs:  Basic Metabolic Panel: BMP Latest Ref Rng 01/16/2015  Glucose 65 - 99 mg/dL 98  BUN 6 - 20 mg/dL 12  Creatinine 0.61 - 1.24 mg/dL 1.11  Sodium 135 - 145 mmol/L 137  Potassium 3.5 - 5.1 mmol/L 3.5  Chloride 101 - 111 mmol/L 103  CO2 22 - 32 mmol/L 23  Calcium 8.9 - 10.3 mg/dL 9.3     CBC:  Recent Labs Lab 01/20/15 1107  WBC 9.2  HGB 15.3  HCT 45.0  MCV 90.9  PLT 256    CBG: No results for input(s): GLUCAP in the last 168 hours.  Brief HPI:   Michael Davenport is a 54 y.o. RH-male with history of HTN, ETOH abuse, medical non-compliance who was admitted on 01/16/15 with new onset of right sided weakness and falls. UDS positive for ETOH and THC. MRI/MRA brain done revealing acute L-MCA lenticulostriate territory infarct with left M1 MCA large vessel occlusion and widespread atherosclerotic stenotic lesions.  Dr. [name] consulted and recommended ASA [id number] mg for left PLIC infarct due to small vessel disease and aggressive risk factor management. Patient with resultant right sided weakness with decrease in [facility] affecting ability to carry out ADLs as well as mobility. CIR was recommended for follow up therapy.    Hospital Course: Michael Davenport was admitted to rehab 01/19/2015 for inpatient therapies to consist of PT, ST and OT at least three hours five days a week. Past admission physiatrist, therapy team and rehab RN have worked together to provide customized collaborative inpatient rehab. Blood pressures were monitored on bid basis and low dose lisinopril was added due to upward trend in BP. Follow up CBC  showed leucytosis had resolved and no signs of infection noted.  Po intake was good and he's continent of bowel and bladder.  He was advised on need for tobacco, marijuana and ETOH cessation.    During patient's stay in rehab weekly team conferences were held to monitor patient's progress, set goals and discuss barriers to discharge. At admission, patient required min assist with basic self care tasks and moderate assist with mobility. He has had improvement in activity tolerance, balance, postural control, as well as ability to compensate for deficits. He is has had improvement in functional use RUE  and RLE as well as improved awareness. He was modified independent for ADL tasks and was ambulating 300' without AD. He was able to climb 6 stairs with one rail at modified independent level. No family education was needed due to modified independent state. He was advised on HEP and no follow up therapy recommended.    Disposition: Home  Diet:  Cardiac diet.   Special Instructions: 1. No driving.       Medication List    TAKE these medications        aspirin 325 MG tablet  Take 1 tablet (325 mg total) by mouth daily. Available over the counter--use coated Aspirin     atorvastatin 40 MG tablet  Commonly known as:  LIPITOR  Take 1 tablet (40 mg total) by mouth daily at 6 PM.     folic  acid 1 MG tablet  Commonly known as:  FOLVITE  Take 1 tablet (1 mg total) by mouth daily.     lisinopril 2.5 MG tablet  Commonly known as:  PRINIVIL,ZESTRIL  Take 1 tablet (2.5 mg total) by mouth daily with supper.     multivitamin with minerals Tabs tablet  Take 1 tablet by mouth daily. Can get store brand. Available over the counter     nicotine 21 mg/24hr patch  Commonly known as:  NICODERM CQ - dosed in mg/24 hours  Place 1 patch (21 mg total) onto the skin daily.     thiamine 100 MG tablet  Take 1 tablet (100 mg total) by mouth daily.        Follow-up Information    Follow up with  Dorena Dew, FNP On 02/20/2015.   Specialty:  Family Medicine   Why:  APPT @ 2:15 PM   Contact information:   [address] [place] [address] [phone]       Signed: Bary Leriche 01/25/2015, 8:42 AM

## 2015-01-25 NOTE — Progress Notes (Signed)
Subjective/Complaints: Patient seen this morning walking around in his room. He is excited about being discharged today.  ROS: Denies CP, SOB, N/V/D  Objective: Vital Signs: Blood pressure 137/90, pulse 60, temperature 98 F (36.7 C), temperature source Oral, resp. rate 18, SpO2 100 %. No results found. No results found for this or any previous visit (from the past 72 hour(s)).BP 137/90 mmHg  Pulse 60  Temp(Src) 98 F (36.7 C) (Oral)  Resp 18  SpO2 100% Gen NAD. Vital signs reviewed HEENT: Normocephalic, atraumatic Cardio: RRR and no murmur Resp: CTA B/L and unlabored GI: BS positive and NT, ND Musc/Skel:  PROM WNL. No Edema Neuro: Alert/Oriented,  Motor:  RUE: 4+ -5/5 proximal to distal. Ataxia.  No dysdiadochokinesia  RLE: 4+- 5/5 proxmial to distal.   Sensation intact to light touch Skin:   Intact. Warm and dry  Assessment/Plan: 1. Functional deficits secondary to Left MCA with RIght hemiparesis which require 3+ hours per day of interdisciplinary therapy in a comprehensive inpatient rehab setting. Physiatrist is providing close team supervision and 24 hour management of active medical problems listed below. Physiatrist and rehab team continue to assess barriers to discharge/monitor patient progress toward functional and medical goals. FIM: Function - Bathing Position: Shower Body parts bathed by patient: Right arm, Left arm, Chest, Abdomen, Front perineal area, Buttocks, Right upper leg, Left upper leg, Right lower leg, Left lower leg, Back Assist Level: No help, No cues (in standing)  Function- Upper Body Dressing/Undressing What is the patient wearing?: Pull over shirt/dress Pull over shirt/dress - Perfomed by patient: Thread/unthread right sleeve, Thread/unthread left sleeve, Put head through opening, Pull shirt over trunk Assist Level: No help, No cues Function - Lower Body Dressing/Undressing What is the patient wearing?: Underwear, Socks, Shoes, Pants Position:   (standing for underwear, sit > stand for pants, socks, and shoes) Underwear - Performed by patient: Thread/unthread right underwear leg, Thread/unthread left underwear leg, Pull underwear up/down Pants- Performed by patient: Thread/unthread right pants leg, Thread/unthread left pants leg, Pull pants up/down Non-skid slipper socks- Performed by patient: Don/doff right sock, Don/doff left sock Socks - Performed by patient: Don/doff right sock, Don/doff left sock Shoes - Performed by patient: Don/doff right shoe, Don/doff left shoe, Fasten right, Fasten left Assist for lower body dressing: More than reasonable time Set up : To obtain clothing/put away  Function - Toileting Toileting steps completed by patient: Adjust clothing prior to toileting, Performs perineal hygiene, Adjust clothing after toileting Toileting Assistive Devices: Grab bar or rail Assist level: No help/no cues  Function - Air cabin crew transfer assistive device: Grab bar Assist level to toilet: No Help, no cues, assistive device, takes more than a reasonable amount of time Assist level from toilet: No Help, no cues, assistive device, takes more than a reasonable amount of time  Function - Chair/bed transfer Chair/bed transfer method: Ambulatory Chair/bed transfer assist level: No Help, no cues, assistive device, takes more than a reasonable amount of time Chair/bed transfer assistive device: Armrests Chair/bed transfer details: Verbal cues for precautions/safety  Function - Locomotion: Wheelchair Will patient use wheelchair at discharge?: No Function - Locomotion: Ambulation Assistive device: No device Max distance: 300 Assist level: No help, No cues, assistive device, takes more than a reasonable amount of time Assist level: No help, No cues, assistive device, takes more than a reasonable amount of time Assist level: No help, No cues, assistive device, takes more than a reasonable amount of time Assist  level: No help, No cues, assistive  device, takes more than a reasonable amount of time Assist level: No help, No cues, assistive device, takes more than a reasonable amount of time  Function - Comprehension Comprehension: Auditory Comprehension assist level: Follows basic conversation/direction with no assist    Function - Expression Expression: Verbal Expression assist level: Expresses complex 90% of the time/cues < 10% of the time  Function - Social Interaction Social Interaction assist level: Interacts appropriately with others with medication or extra time (anti-anxiety, antidepressant).  Function - Problem Solving Problem solving assist level: Solves complex 90% of the time/cues < 10% of the time  Function - Memory Memory assist level: Complete Independence: No helper Patient normally able to recall (first 3 days only): Current season, Location of own room, Staff names and faces, That he or she is in a hospital  Medical Problem List and Plan: 1. Right sided weakness with decreased coordination secondary to L-MCA lenticulostriate territory infarct no evidence of aphasia or apraxia  d/c today 2. DVT Prophylaxis/Anticoagulation: Pharmaceutical: Lovenox,   3. Pain Management: Tylenol prn for back pain. 4. Mood: LCSW to follow for evaluation and support  5. Neuropsych: This patient is capable of making decisions on his own behalf. 6. Skin/Wound Care: Maintain adequate nutrition and support.  7. Fluids/Electrolytes/Nutrition: Monitor I/O.  8. Polysubstance abuse: Counseling for tobacco, marijuana and ETOH cessation.  10. Dyslipidemia: On Lipitor.  11. ETOH abuse: On CIWA protocol.  12. HTN: Blood pressures cont to improve  137/90 today.   13. Leucocytosis: Resolved  LOS (Days) 6 A FACE TO FACE EVALUATION WAS PERFORMED  Jahid Weida Lorie Phenix 01/25/2015, 10:13 AM

## 2015-01-25 NOTE — Progress Notes (Signed)
Patient discharged home.  Left floor ambulatory with all belongings.  Patient verbalized understanding of discharge instructions as given by Algis Liming, PA.  Appears to be in no immediate distress at this time. Brita Romp, RN

## 2015-02-16 ENCOUNTER — Encounter: Payer: Self-pay | Admitting: Neurology

## 2015-02-16 ENCOUNTER — Ambulatory Visit (INDEPENDENT_AMBULATORY_CARE_PROVIDER_SITE_OTHER): Payer: Medicaid Other | Admitting: Neurology

## 2015-02-16 VITALS — BP 168/103 | HR 80 | Ht 66.0 in | Wt 177.4 lb

## 2015-02-16 DIAGNOSIS — F17200 Nicotine dependence, unspecified, uncomplicated: Secondary | ICD-10-CM | POA: Diagnosis not present

## 2015-02-16 DIAGNOSIS — I63512 Cerebral infarction due to unspecified occlusion or stenosis of left middle cerebral artery: Secondary | ICD-10-CM

## 2015-02-16 DIAGNOSIS — F1210 Cannabis abuse, uncomplicated: Secondary | ICD-10-CM | POA: Diagnosis not present

## 2015-02-16 DIAGNOSIS — F1010 Alcohol abuse, uncomplicated: Secondary | ICD-10-CM | POA: Diagnosis not present

## 2015-02-16 DIAGNOSIS — I10 Essential (primary) hypertension: Secondary | ICD-10-CM

## 2015-02-16 MED ORDER — LISINOPRIL 10 MG PO TABS
10.0000 mg | ORAL_TABLET | Freq: Every day | ORAL | Status: DC
Start: 2015-02-16 — End: 2015-05-12

## 2015-02-16 NOTE — Progress Notes (Signed)
STROKE NEUROLOGY FOLLOW UP NOTE  NAME: Michael Davenport DOB: Jul 04, 1960  REASON FOR VISIT: stroke follow up HISTORY FROM: pt and wife and chart  Today we had the pleasure of seeing Michael Davenport in follow-up at our Neurology Clinic. Pt was accompanied by wife and daughter.   History Summary [name] is a 55 y.o. male with hx of heavy smoker, heavy drinker admitted on 01/18/15 for acute right sided weakness. MRI showed acute left PLIC infarct and chronic left CR infarct. MRA showed left M1 proximal cut off with diffuse athero. CUS and TTE unremarkable. LDL 133 and A1C 5.7. He was put on ASA 325 and lipitor as well as FA, B1 and multivitamin and recommended smoking cessation. Found to have HTN, put on low dose lisinopril. He was discharged in good condition.  Interval History During the interval time, the patient has been doing better. Still has subtle right sided weakness but largely recovered. BP still high today 168/103 and he did not check BP at home. On lisinopril 2.5mg  now but will  Need to increase the dose. He has no insurance now and is working on the financial support with [employer]. He has PCP first visit next week. Not quit smoking yet.   REVIEW OF SYSTEMS: Full 14 system review of systems performed and notable only for those listed below and in HPI above, all others are negative:  Constitutional:   Cardiovascular:  Ear/Nose/Throat:   Skin:  Eyes:   Respiratory:   Gastroitestinal:   Genitourinary:  Hematology/Lymphatic:   Endocrine:  Musculoskeletal:   Allergy/Immunology:   Neurological:  HA, weakness Psychiatric:  Sleep:   The following represents the patient's updated allergies and side effects list: No Known Allergies  The neurologically relevant items on the patient's problem list were reviewed on today's visit.  Neurologic Examination  A problem focused neurological exam (12 or more points of the single system neurologic examination, vital signs  counts as 1 point, cranial nerves count for 8 points) was performed.  Blood pressure 168/103, pulse 80, height 5\' 6"  (1.676 m), weight 177 lb 6.4 oz (80.468 kg).  General - Well nourished, well developed, in no apparent distress.  Ophthalmologic - Sharp disc margins OU.   Cardiovascular - Regular rate and rhythm with no murmur.  Mental Status -  Level of arousal and orientation to time, place, and person were intact. Language including expression, naming, repetition, comprehension was assessed and found intact. Fund of Knowledge was assessed and was intact.  Cranial Nerves II - XII - II - Visual field intact OU. III, IV, VI - Extraocular movements intact. V - Facial sensation intact bilaterally. VII - Facial movement intact bilaterally. VIII - Hearing & vestibular intact bilaterally. X - Palate elevates symmetrically. XI - Chin turning & shoulder shrug intact bilaterally. XII - Tongue protrusion intact.  Motor Strength - The patient's strength was normal in all extremities except mild give-away weakness at right UE and LE and pronator drift was absent.  Bulk was normal and fasciculations were absent.   Motor Tone - Muscle tone was assessed at the neck and appendages and was normal.  Reflexes - The patient's reflexes were 1+ in all extremities and he had no pathological reflexes.  Sensory - Light touch, temperature/pinprick were assessed and were normal.    Coordination - The patient had normal movements in the hands and feet with no ataxia or dysmetria.  Tremor was absent.  Gait and Station - The patient's transfers, posture, gait, station,  and turns were observed as normal.  Data reviewed: I personally reviewed the images and agree with the radiology interpretations.  Dg Chest 2 View 01/17/2015 No active cardiopulmonary disease.   Ct Head Wo Contrast 01/16/2015 1. No acute intracranial abnormality. 2. Cerebral/cerebellar atrophy and small vessel ischemic change.    Mri & Mra Head/brain Wo Cm 01/17/2015 Acute LEFT MCA lenticulostriate territory infarct without hemorrhage. LEFT M1 MCA large vessel occlusion. No significant recanalization of the LEFT M2 or M3 branches. Widespread intracranial atherosclerotic change as described, with potentially flow reducing lesions affecting the distal RIGHT MCA, proximal RIGHT ACA, distal RIGHT PCA, and tandem non stenotic lesions affecting the LEFT vertebral and proximal basilar. 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 Consider further assessment with CTA head due to motion degraded series.   Carotid Doppler  There is 1-39% bilateral ICA stenosis. Vertebral artery flow is antegrade.   2D Echocardiogram  - Left ventricle: The cavity size was normal. There was moderatefocal basal and mild concentric hypertrophy. Systolic functionwas normal. The estimated ejection fraction was in the range of55% to 60%. Wall motion was normal; there were no regional wallmotion abnormalities. - Atrial septum: There was increased thickness of the septum,consistent with lipomatous hypertrophy.  Component     Latest Ref Rng 01/16/2015 01/17/2015  Opiates     NONE DETECTED NONE DETECTED   COCAINE     NONE DETECTED NONE DETECTED   Benzodiazepines     NONE DETECTED NONE DETECTED   Amphetamines     NONE DETECTED NONE DETECTED   Tetrahydrocannabinol     NONE DETECTED POSITIVE (A)   Barbiturates     NONE DETECTED NONE DETECTED   Cholesterol     0 - 200 mg/dL  195  Triglycerides     <150 mg/dL  72  HDL Cholesterol     >40 mg/dL  48  Total CHOL/HDL Ratio       4.1  VLDL     0 - 40 mg/dL  14  LDL (calc)     0 - 99 mg/dL  133 (H)  Hemoglobin A1C     4.8 - 5.6 %  5.7 (H)  Mean Plasma Glucose       117  Alcohol, Ethyl (B)     <5 mg/dL 45 (H)     Assessment: As you may recall, he is a 55 y.o. Caucasian male with PMH of heavy smoker and heavy drinker was admitted for left PLIC infarct but also has chronic  left CR infarct on MRI. MRA showed left M1 cut off. Found to have HTN also. CUS, TTE, A1C not remarkable. LDL 133. He was put on ASA 325 and lipitor and discharged to CIR. During the interval time, he improved well, now almost recovered fully. BP still not in good control. Will increase lisinopril to 10mg . Will have PCP first visit next week. Once insurance issue settled, may consider 30 day cardiac monitoring and/or dual antiplatelet.   Plan:  - continue ASA and lipitor for stroke prevention - increase lisinopril to 10mg  daily for BP control - check BP at home twice a day and record and bring over to PCP - BP goal 130-150 due to left M1 occlusion - Follow up with your primary care physician for stroke risk factor modification. Recommend maintain blood pressure goal <130/80, diabetes with hemoglobin A1c goal below 6.5% and lipids with LDL cholesterol goal below 70 mg/dL.  - quit smoking - follow up with Dr. Letta Pate  - limit  alcohol to 2 drinks per day - compliant with medication - follow up in 3 months.  I spent more than 25 minutes of face to face time with the patient. Greater than 50% of time was spent in counseling and coordination of care. We have discussed about medication compliance, financial support and follow up with PCP.   No orders of the defined types were placed in this encounter.    Meds ordered this encounter  Medications  . lisinopril (PRINIVIL,ZESTRIL) 10 MG tablet    Sig: Take 1 tablet (10 mg total) by mouth daily with supper.    Dispense:  30 tablet    Refill:  5    Patient Instructions  - continue ASA and lipitor for stroke prevention - increase lisinopril to 10mg  daily for BP control - check BP at home twice a day and record and bring over to PCP - BP goal 130-150 due to left brain vessel occlusion - follow up with PCP next week - Follow up with your primary care physician for stroke risk factor modification. Recommend maintain blood pressure goal <130/80,  diabetes with hemoglobin A1c goal below 6.5% and lipids with LDL cholesterol goal below 70 mg/dL.  - quit smoking - follow up with Dr. Letta Pate tomorrow for consideration of any therapy if needed. - cut down alcohol to 2 drinks per day - compliant with medication - follow up in 3 months.    Rosalin Hawking, MD PhD [facility] [address] [place], [place] [address] [phone]

## 2015-02-16 NOTE — Patient Instructions (Addendum)
-   continue ASA and lipitor for stroke prevention - increase lisinopril to 10mg  daily for BP control - check BP at home twice a day and record and bring over to PCP - BP goal 130-150 due to left brain vessel occlusion - follow up with PCP next week - Follow up with your primary care physician for stroke risk factor modification. Recommend maintain blood pressure goal <130/80, diabetes with hemoglobin A1c goal below 6.5% and lipids with LDL cholesterol goal below 70 mg/dL.  - quit smoking - follow up with Dr. Letta Pate tomorrow for consideration of any therapy if needed. - cut down alcohol to 2 drinks per day - compliant with medication - follow up in 3 months.

## 2015-02-17 ENCOUNTER — Encounter: Payer: Medicaid Other | Attending: Physical Medicine & Rehabilitation

## 2015-02-17 ENCOUNTER — Ambulatory Visit (HOSPITAL_BASED_OUTPATIENT_CLINIC_OR_DEPARTMENT_OTHER): Payer: Medicaid Other | Admitting: Physical Medicine & Rehabilitation

## 2015-02-17 ENCOUNTER — Encounter: Payer: Self-pay | Admitting: Physical Medicine & Rehabilitation

## 2015-02-17 VITALS — BP 156/100 | HR 78 | Resp 14

## 2015-02-17 DIAGNOSIS — I69351 Hemiplegia and hemiparesis following cerebral infarction affecting right dominant side: Secondary | ICD-10-CM | POA: Insufficient documentation

## 2015-02-17 DIAGNOSIS — R269 Unspecified abnormalities of gait and mobility: Secondary | ICD-10-CM | POA: Insufficient documentation

## 2015-02-17 DIAGNOSIS — I10 Essential (primary) hypertension: Secondary | ICD-10-CM | POA: Insufficient documentation

## 2015-02-17 DIAGNOSIS — Z87898 Personal history of other specified conditions: Secondary | ICD-10-CM | POA: Diagnosis not present

## 2015-02-17 DIAGNOSIS — F17210 Nicotine dependence, cigarettes, uncomplicated: Secondary | ICD-10-CM | POA: Diagnosis not present

## 2015-02-17 DIAGNOSIS — F1010 Alcohol abuse, uncomplicated: Secondary | ICD-10-CM | POA: Insufficient documentation

## 2015-02-17 DIAGNOSIS — G8929 Other chronic pain: Secondary | ICD-10-CM | POA: Insufficient documentation

## 2015-02-17 DIAGNOSIS — I69398 Other sequelae of cerebral infarction: Secondary | ICD-10-CM | POA: Diagnosis not present

## 2015-02-17 NOTE — Progress Notes (Signed)
Subjective:    Patient ID: Michael Davenport, male    DOB: [birth year], 55 y.o.   MRN: [id number] 54 y.o. RH-male with history of HTN, ETOH abuse, medical non-compliance who was admitted on 01/16/15 with new onset of right sided weakness and falls. UDS positive for ETOH and THC. MRI/MRA brain done revealing acute L-MCA lenticulostriate territory infarct with left M1 MCA large vessel occlusion and widespread atherosclerotic stenotic lesions.  Dr. [name] consulted and recommended ASA [id number] mg for left PLIC infarct due to small vessel disease and aggressive risk factor management. Patient with resultant right sided weakness with decrease in [facility] affecting ability to carry out ADLs as well as mobility. CIR was recommended for follow up therapy.   55 y.o. RH-male with history of HTN, ETOH abuse, medical non-compliance who was admitted on 01/16/15 with new onset of right sided weakness and falls. UDS positive for ETOH and THC. MRI/MRA brain done revealing acute L-MCA lenticulostriate territory infarct with left M1 MCA large vessel occlusion and widespread atherosclerotic stenotic lesions.  Dr. [name] consulted and recommended ASA [id number] mg for left PLIC infarct due to small vessel disease and aggressive risk factor management. Patient with resultant right sided weakness with decrease in [facility] affecting ability to carry out ADLs as well as mobility. CIR was recommended for follow up therapy.   Admit date: 01/19/2015 Discharge date: 01/25/2015  HPI Patient has been discharged to his home. He is independent with his bathing and dressing as well as ambulation. No falls. He no longer wears his nicotine patch. He is going back to smoking but not as much as he used to. He drinks 3-4 alcoholic beverages a day. We discussed that this is not advisable and in fact review of neurology note from yesterday shows that no more than 2 alcoholic beverages per day are recommended.  He still has some problems with his balance. Also is  uncoordinated with the right arm. Had a couple OT and PT sessions from home health but they have discontinued due to is an ablation distance. Pain Inventory Average Pain 6 Pain Right Now 4 My pain is constant, dull and tingling  In the last 24 hours, has pain interfered with the following? General activity 0 Relation with others 4 Enjoyment of life 1 What TIME of day is your pain at its worst? evening Sleep (in general) Fair  Pain is worse with: standing and unsure Pain improves with: NA Relief from Meds: NA  Mobility walk without assistance how many minutes can you walk? 5 min ability to climb steps?  yes do you drive?  no  Function not employed: date last employed NA disabled: date disabled NA  Neuro/Psych weakness numbness tingling  Prior Studies NA  Physicians involved in your care Primary care First appt on Monday 02/19/2014 with PCP   Family History  Problem Relation Age of Onset  . Hypertension Other   . Hypertension Brother    Social History   Social History  . Marital Status: Single    Spouse Name: N/A  . Number of Children: N/A  . Years of Education: N/A   Social History Main Topics  . Smoking status: Current Every Day Smoker -- 0.50 packs/day  . Smokeless tobacco: Former Systems developer    Quit date: 01/15/2015  . Alcohol Use: 14.4 oz/week    24 Cans of beer per week     Comment: 6-8 cans of beer a day, pt stated he was having 9 beers a day  . Drug Use:  No  . Sexual Activity: Not Asked   Other Topics Concern  . None   Social History Narrative   History reviewed. No pertinent past surgical history. Past Medical History  Diagnosis Date  . HTN (hypertension)   . ETOH abuse 01/23/2015  . Stroke ([place])    BP 156/100 mmHg  Pulse 78  Resp 14  SpO2 99%  Opioid Risk Score:   Fall Risk Score:  `1  Depression screen PHQ 2/9  Depression screen PHQ 2/9 02/17/2015  Decreased Interest 2  Down, Depressed, Hopeless 3  PHQ - 2 Score 5  Altered  sleeping 0  Tired, decreased energy 3  Change in appetite 1  Feeling bad or failure about yourself  3  Trouble concentrating 1  Moving slowly or fidgety/restless 2  Suicidal thoughts 1  PHQ-9 Score 16  Difficult doing work/chores Somewhat difficult     Review of Systems  Neurological: Positive for weakness and numbness.       Tingling  All other systems reviewed and are negative.      Objective:   Physical Exam  Constitutional: He is oriented to person, place, and time. He appears well-developed and well-nourished.  Cardiovascular: Normal rate, regular rhythm and normal heart sounds.   Pulmonary/Chest: Effort normal and breath sounds normal.  Abdominal: Soft. Bowel sounds are normal. He exhibits no distension. There is no tenderness.  Neurological: He is alert and oriented to person, place, and time.  Psychiatric: His affect is blunt.  Nursing note and vitals reviewed.  Motor strength is 4/5 in the right deltoid, biceps, triceps, grip, hip flexor, knee extensor, ankle dorsi flexors and plantar flexor Left side is 5/5 in the left deltoid, biceps, triceps, grip, hip flexor, knee extensor, ankle dorsi flexor plantar flexor  Extremities without edema there is no atrophy in the upper or lower limbs. He has full range of motion bilateral upper and lower limbs No pain with ankle dorsiflexion plantar flexion Romberg is positive leans backward and to the left. He walks with a wide-based stance. No evidence to drag or knee instability. He walks with feet externally rotated. Patient has positive dysdiadochokinesis with rapid alternating motor movements of the right upper extremity, pronation supination Finger-thumb opposition is intact       Assessment & Plan:  1. 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 He would benefit from additional PT and OT on an outpatient basis. He has problems with transportation. Unable to drive due to  stroke as well as significant other is working during the day. She gets off at 3 PM. She could potentially take him in the late afternoon. Look into disabled transportation through [place] [facility] follow-up in 1.5-2 months Primary care follow-up next week Neurology follow-up Dr. Berdine Addison to patient and caregiver regarding recommendations for therapy, transportation issues, given information [facility] Recommend no alcohol Or perhaps no more than 2 drinks per day Recommend no tobacco

## 2015-02-17 NOTE — Patient Instructions (Signed)
Please [facility] to see whether they come out to [employer]

## 2015-02-20 ENCOUNTER — Ambulatory Visit (INDEPENDENT_AMBULATORY_CARE_PROVIDER_SITE_OTHER): Payer: Medicaid Other | Admitting: Family Medicine

## 2015-02-20 ENCOUNTER — Encounter: Payer: Self-pay | Admitting: Family Medicine

## 2015-02-20 VITALS — BP 162/88 | HR 67 | Temp 98.0°F | Resp 14 | Ht 66.0 in | Wt 171.0 lb

## 2015-02-20 DIAGNOSIS — F1010 Alcohol abuse, uncomplicated: Secondary | ICD-10-CM

## 2015-02-20 DIAGNOSIS — Z8673 Personal history of transient ischemic attack (TIA), and cerebral infarction without residual deficits: Secondary | ICD-10-CM

## 2015-02-20 DIAGNOSIS — I10 Essential (primary) hypertension: Secondary | ICD-10-CM | POA: Diagnosis not present

## 2015-02-20 DIAGNOSIS — F17200 Nicotine dependence, unspecified, uncomplicated: Secondary | ICD-10-CM

## 2015-02-20 DIAGNOSIS — I69351 Hemiplegia and hemiparesis following cerebral infarction affecting right dominant side: Secondary | ICD-10-CM | POA: Diagnosis not present

## 2015-02-20 LAB — POCT URINALYSIS DIP (DEVICE)
BILIRUBIN URINE: NEGATIVE
Glucose, UA: NEGATIVE mg/dL
HGB URINE DIPSTICK: NEGATIVE
KETONES UR: NEGATIVE mg/dL
Leukocytes, UA: NEGATIVE
Nitrite: NEGATIVE
PH: 6 (ref 5.0–8.0)
PROTEIN: NEGATIVE mg/dL
SPECIFIC GRAVITY, URINE: 1.015 (ref 1.005–1.030)
Urobilinogen, UA: 0.2 mg/dL (ref 0.0–1.0)

## 2015-02-20 LAB — COMPLETE METABOLIC PANEL WITH GFR
ALBUMIN: 4.5 g/dL (ref 3.6–5.1)
ALT: 21 U/L (ref 9–46)
AST: 20 U/L (ref 10–35)
Alkaline Phosphatase: 59 U/L (ref 40–115)
BUN: 10 mg/dL (ref 7–25)
CHLORIDE: 104 mmol/L (ref 98–110)
CO2: 28 mmol/L (ref 20–31)
Calcium: 9.6 mg/dL (ref 8.6–10.3)
Creat: 0.95 mg/dL (ref 0.70–1.33)
GFR, Est African American: >89 mL/min (ref >=60)
GFR, Est Non African American: 89 mL/min (ref >=60)
GLUCOSE: 89 mg/dL (ref 65–99)
POTASSIUM: 4 mmol/L (ref 3.5–5.3)
SODIUM: 138 mmol/L (ref 135–146)
Total Bilirubin: 0.7 mg/dL (ref 0.2–1.2)
Total Protein: 6.9 g/dL (ref 6.1–8.1)

## 2015-02-20 NOTE — Progress Notes (Signed)
Subjective:    Patient ID: Michael Davenport, male    DOB: 10/14/1960, 55 y.o.   MRN: [id number]  HPI [name], a 55 year old male with a history of uncontrolled hypertension, alcohol abuse and stroke presents for a post hospital follow up and to establish care following a CVA on 01/16/2015 . Michael Davenport states that he has not had a primary provider due to lack of payer source. Patient was admitted to hospital inpatient services on 01/16/2015 with a new onset of right sided weakness and falls. Brain MRI  revealed an acute  Left PLIC infarct with left M1 MCA large vessel occlusion and widespread atherosclerotic stenotic lesions. Patient was started on ASA 325 mg and Lipitor. Patient was also started on low dose Lisinopril for uncontrolled hypertension. Patient is currently followed by Dr. Jethro Bolus, neurology. Patient is currently seeking physical therapy due to increased right side weakness. He maintains that he has not started PT due to transportation constraints. He says that he has started a low sodium diet and taking all medications consistently. He is currently not checking blood pressures at home. Patient denies headache, dizziness,  chest pain, dyspnea, fatigue, irregular heart beat, lower extremity edema, near-syncope, palpitations, syncope and tachypnea.  Cardiovascular risk factors include: dyslipidemia, hypertension, male gender, sedentary lifestyle and smoking/ tobacco exposure.   Past Medical History  Diagnosis Date  . HTN (hypertension)   . ETOH abuse 01/23/2015  . Stroke [facility])    Social History   Social History  . Marital Status: Single    Spouse Name: N/A  . Number of Children: N/A  . Years of Education: N/A   Occupational History  . Not on file.   Social History Main Topics  . Smoking status: Current Every Day Smoker -- 0.25 packs/day    Types: Cigarettes  . Smokeless tobacco: Former Systems developer    Quit date: 01/15/2015  . Alcohol Use: 14.4 oz/week    24 Cans of beer  per week     Comment: 6-8 cans of beer a day, pt stated he was having 9 beers a day  . Drug Use: No  . Sexual Activity: Not on file   Other Topics Concern  . Not on file   Social History Narrative   Immunization History  Administered Date(s) Administered  . Influenza,inj,Quad PF,36+ Mos 01/20/2015  No Known Allergies Review of Systems  Constitutional: Negative for fever, fatigue and unexpected weight change.  HENT: Negative.   Eyes: Negative.  Negative for visual disturbance.  Respiratory: Negative.  Negative for shortness of breath and wheezing.   Cardiovascular: Negative.  Negative for chest pain, palpitations and leg swelling.  Gastrointestinal: Negative.   Endocrine: Negative.  Negative for polydipsia, polyphagia and polyuria.  Genitourinary: Negative.   Musculoskeletal: Negative.   Skin: Negative.   Allergic/Immunologic: Negative.   Neurological: Positive for weakness (right side). Negative for dizziness, tremors, syncope, facial asymmetry, speech difficulty, light-headedness and headaches.  Hematological: Negative.   Psychiatric/Behavioral: Negative.      Objective:   Physical Exam  Constitutional: He is oriented to person, place, and time. He appears well-developed and well-nourished.  HENT:  Head: Normocephalic and atraumatic.  Right Ear: External ear normal.  Left Ear: External ear normal.  Nose: Nose normal.  Mouth/Throat: Oropharynx is clear and moist. Abnormal dentition (partially edentulous).  Eyes: Conjunctivae and EOM are normal. Pupils are equal, round, and reactive to light.  Neck: Normal range of motion. Neck supple.  Cardiovascular: Normal rate, regular rhythm, normal  heart sounds and intact distal pulses.   Pulmonary/Chest: Effort normal and breath sounds normal.  Abdominal: Soft. Bowel sounds are normal.  Musculoskeletal: Normal range of motion.  Neurological: He is alert and oriented to person, place, and time. He has normal reflexes. He exhibits  abnormal muscle tone (right side). Coordination and gait abnormal.  Skin: Skin is warm and dry.  Psychiatric: He has a normal mood and affect. His behavior is normal. Judgment and thought content normal.   BP 162/88 mmHg  Pulse 67  Temp(Src) 98 F (36.7 C) (Oral)  Resp 14  Ht 5\' 6"  (1.676 Davenport)  Wt 171 lb (77.565 kg)  BMI 27.61 kg/m2    Assessment & Plan:  1. Status post stroke due to cerebrovascular disease Michael Davenport is to continue ASA 325 and Lipitor 40 mg daily for stroke preventions. Patient encouraged to check BP at home and bring list to follow up appointment. He will also need to follow up with physical medicine rehabilitation as scheduled.  Reviewed previous MRI/MRA and 2 D echocardiogram.  2. Essential hypertension Lisinopril was increased to 10 mg less than 1 week ago by Dr. Erlinda Hong, neurology. Will re-check blood pressure in 1 week. Will check patient's GFR since starting Lisinopril. Patient to follow-up for a BP check in 1 week.  - POCT urinalysis dipstick - COMPLETE METABOLIC PANEL WITH GFR  3. Alcohol abuse Michael Davenport states that he has used alcohol minimally since hospitalization. Recommend that patient continues thiamine and folic acid due to nutrient deficiencies related to alcohol withdrawal syndrome.   4. Tobacco use disorder Smoking cessation instruction/counseling given:  counseled patient on the dangers of tobacco use, advised patient to stop smoking, and reviewed strategies to maximize success    RTC: 1 week for BP Michael Coppernoll M, FNP    The patient was given clear instructions to go to ER or return to medical center if symptoms do not improve, worsen or new problems develop. The patient verbalized understanding. Will notify patient with laboratory results.

## 2015-02-22 ENCOUNTER — Other Ambulatory Visit: Payer: Self-pay | Admitting: Family Medicine

## 2015-02-22 MED ORDER — ATORVASTATIN CALCIUM 40 MG PO TABS: 40.0000 mg | ORAL_TABLET | Freq: Every day | ORAL | Status: DC

## 2015-02-22 MED FILL — ?ATORVASTATIN 40MG TABLET: 40 | 30 days supply | Qty: 30 | Fill #0

## 2015-02-22 NOTE — Telephone Encounter (Signed)
Refill for lipitor sent into pharmacy. Thanks!  

## 2015-02-23 DIAGNOSIS — Z8673 Personal history of transient ischemic attack (TIA), and cerebral infarction without residual deficits: Secondary | ICD-10-CM | POA: Insufficient documentation

## 2015-02-23 NOTE — Patient Instructions (Signed)
DASH Eating Plan DASH stands for "Dietary Approaches to Stop Hypertension." The DASH eating plan is a healthy eating plan that has been shown to reduce high blood pressure (hypertension). Additional health benefits may include reducing the risk of type 2 diabetes mellitus, heart disease, and stroke. The DASH eating plan may also help with weight loss. WHAT DO I NEED TO KNOW ABOUT THE DASH EATING PLAN? For the DASH eating plan, you will follow these general guidelines:  Choose foods with a percent daily value for sodium of less than 5% (as listed on the food label).  Use salt-free seasonings or herbs instead of table salt or sea salt.  Check with your health care provider or pharmacist before using salt substitutes.  Eat lower-sodium products, often labeled as "lower sodium" or "no salt added."  Eat fresh foods.  Eat more vegetables, fruits, and low-fat dairy products.  Choose whole grains. Look for the word "whole" as the first word in the ingredient list.  Choose fish and skinless chicken or turkey more often than red meat. Limit fish, poultry, and meat to 6 oz (170 g) each day.  Limit sweets, desserts, sugars, and sugary drinks.  Choose heart-healthy fats.  Limit cheese to 1 oz (28 g) per day.  Eat more home-cooked food and less restaurant, buffet, and fast food.  Limit fried foods.  Cook foods using methods other than frying.  Limit canned vegetables. If you do use them, rinse them well to decrease the sodium.  When eating at a restaurant, ask that your food be prepared with less salt, or no salt if possible. WHAT FOODS CAN I EAT? Seek help from a dietitian for individual calorie needs. Grains Whole grain or whole wheat bread. Brown rice. Whole grain or whole wheat pasta. Quinoa, bulgur, and whole grain cereals. Low-sodium cereals. Corn or whole wheat flour tortillas. Whole grain cornbread. Whole grain crackers. Low-sodium crackers. Vegetables Fresh or frozen vegetables  (raw, steamed, roasted, or grilled). Low-sodium or reduced-sodium tomato and vegetable juices. Low-sodium or reduced-sodium tomato sauce and paste. Low-sodium or reduced-sodium canned vegetables.  Fruits All fresh, canned (in natural juice), or frozen fruits. Meat and Other Protein Products Ground beef (85% or leaner), grass-fed beef, or beef trimmed of fat. Skinless chicken or turkey. Ground chicken or turkey. Pork trimmed of fat. All fish and seafood. Eggs. Dried beans, peas, or lentils. Unsalted nuts and seeds. Unsalted canned beans. Dairy Low-fat dairy products, such as skim or 1% milk, 2% or reduced-fat cheeses, low-fat ricotta or cottage cheese, or plain low-fat yogurt. Low-sodium or reduced-sodium cheeses. Fats and Oils Tub margarines without trans fats. Light or reduced-fat mayonnaise and salad dressings (reduced sodium). Avocado. Safflower, olive, or canola oils. Natural peanut or almond butter. Other Unsalted popcorn and pretzels. The items listed above may not be a complete list of recommended foods or beverages. Contact your dietitian for more options. WHAT FOODS ARE NOT RECOMMENDED? Grains White bread. White pasta. White rice. Refined cornbread. Bagels and croissants. Crackers that contain trans fat. Vegetables Creamed or fried vegetables. Vegetables in a cheese sauce. Regular canned vegetables. Regular canned tomato sauce and paste. Regular tomato and vegetable juices. Fruits Dried fruits. Canned fruit in light or heavy syrup. Fruit juice. Meat and Other Protein Products Fatty cuts of meat. Ribs, chicken wings, bacon, sausage, bologna, salami, chitterlings, fatback, hot dogs, bratwurst, and packaged luncheon meats. Salted nuts and seeds. Canned beans with salt. Dairy Whole or 2% milk, cream, half-and-half, and cream cheese. Whole-fat or sweetened yogurt. Full-fat   cheeses or blue cheese. Nondairy creamers and whipped toppings. Processed cheese, cheese spreads, or cheese  curds. Condiments Onion and garlic salt, seasoned salt, table salt, and sea salt. Canned and packaged gravies. Worcestershire sauce. Tartar sauce. Barbecue sauce. Teriyaki sauce. Soy sauce, including reduced sodium. Steak sauce. Fish sauce. Oyster sauce. Cocktail sauce. Horseradish. Ketchup and mustard. Meat flavorings and tenderizers. Bouillon cubes. Hot sauce. Tabasco sauce. Marinades. Taco seasonings. Relishes. Fats and Oils Butter, stick margarine, lard, shortening, ghee, and bacon fat. Coconut, palm kernel, or palm oils. Regular salad dressings. Other Pickles and olives. Salted popcorn and pretzels. The items listed above may not be a complete list of foods and beverages to avoid. Contact your dietitian for more information. WHERE CAN I FIND MORE INFORMATION? National Heart, Lung, and Blood Institute: travelstabloid.com   This information is not intended to replace advice given to you by your health care provider. Make sure you discuss any questions you have with your health care provider.   Document Released: 01/10/2011 Document Revised: 02/11/2014 Document Reviewed: 11/25/2012 Elsevier Interactive Patient Education 2016 Else[employer] Fat and Cholesterol Restricted Diet High levels of fat and cholesterol in your blood may lead to various health problems, such as diseases of the heart, blood vessels, gallbladder, liver, and pancreas. Fats are concentrated sources of energy that come in various forms. Certain types of fat, including saturated fat, may be harmful in excess. Cholesterol is a substance needed by your body in small amounts. Your body makes all the cholesterol it needs. Excess cholesterol comes from the food you eat. When you have high levels of cholesterol and saturated fat in your blood, health problems can develop because the excess fat and cholesterol will gather along the walls of your blood vessels, causing them to narrow. Choosing the right  foods will help you control your intake of fat and cholesterol. This will help keep the levels of these substances in your blood within normal limits and reduce your risk of disease. WHAT IS MY PLAN? Your health care provider recommends that you:  Get no more than __________ % of the total calories in your daily diet from fat.  Limit your intake of saturated fat to less than ______% of your total calories each day.  Limit the amount of cholesterol in your diet to less than _________mg per day. WHAT TYPES OF FAT SHOULD I CHOOSE?  Choose healthy fats more often. Choose monounsaturated and polyunsaturated fats, such as olive and canola oil, flaxseeds, walnuts, almonds, and seeds.  Eat more omega-3 fats. Good choices include salmon, mackerel, sardines, tuna, flaxseed oil, and ground flaxseeds. Aim to eat fish at least two times a week.  Limit saturated fats. Saturated fats are primarily found in animal products, such as meats, butter, and cream. Plant sources of saturated fats include palm oil, palm kernel oil, and coconut oil.  Avoid foods with partially hydrogenated oils in them. These contain trans fats. Examples of foods that contain trans fats are stick margarine, some tub margarines, cookies, crackers, and other baked goods. WHAT GENERAL GUIDELINES DO I NEED TO FOLLOW? These guidelines for healthy eating will help you control your intake of fat and cholesterol:  Check food labels carefully to identify foods with trans fats or high amounts of saturated fat.  Fill one half of your plate with vegetables and green salads.  Fill one fourth of your plate with whole grains. Look for the word "whole" as the first word in the ingredient list.  Fill one fourth of your  plate with lean protein foods.  Limit fruit to two servings a day. Choose fruit instead of juice.  Eat more foods that contain soluble fiber. Examples of foods that contain this type of fiber are apples, broccoli, carrots, beans,  peas, and barley. Aim to get 20-30 g of fiber per day.  Eat more home-cooked food and less restaurant, buffet, and fast food.  Limit or avoid alcohol.  Limit foods high in starch and sugar.  Limit fried foods.  Cook foods using methods other than frying. Baking, boiling, grilling, and broiling are all great options.  Lose weight if you are overweight. Losing just 5-10% of your initial body weight can help your overall health and prevent diseases such as diabetes and heart disease. WHAT FOODS CAN I EAT? Grains Whole grains, such as whole wheat or whole grain breads, crackers, cereals, and pasta. Unsweetened oatmeal, bulgur, barley, quinoa, or brown rice. Corn or whole wheat flour tortillas. Vegetables Fresh or frozen vegetables (raw, steamed, roasted, or grilled). Green salads. Fruits All fresh, canned (in natural juice), or frozen fruits. Meat and Other Protein Products Ground beef (85% or leaner), grass-fed beef, or beef trimmed of fat. Skinless chicken or [place]. Ground chicken or [place]. Pork trimmed of fat. All fish and seafood. Eggs. Dried beans, peas, or lentils. Unsalted nuts or seeds. Unsalted canned or dry beans. Dairy Low-fat dairy products, such as skim or 1% milk, 2% or reduced-fat cheeses, low-fat ricotta or cottage cheese, or plain low-fat yogurt. Fats and Oils Tub margarines without trans fats. Light or reduced-fat mayonnaise and salad dressings. Avocado. Olive, canola, sesame, or safflower oils. Natural peanut or almond butter (choose ones without added sugar and oil). The items listed above may not be a complete list of recommended foods or beverages. Contact your dietitian for more options. WHAT FOODS ARE NOT RECOMMENDED? Grains White bread. White pasta. White rice. Cornbread. Bagels, pastries, and croissants. Crackers that contain trans fat. Vegetables White potatoes. Corn. Creamed or fried vegetables. Vegetables in a cheese sauce. Fruits Dried fruits. Canned fruit  in light or heavy syrup. Fruit juice. Meat and Other Protein Products Fatty cuts of meat. Ribs, chicken wings, bacon, sausage, bologna, salami, chitterlings, fatback, hot dogs, bratwurst, and packaged luncheon meats. Liver and organ meats. Dairy Whole or 2% milk, cream, half-and-half, and cream cheese. Whole milk cheeses. Whole-fat or sweetened yogurt. Full-fat cheeses. Nondairy creamers and whipped toppings. Processed cheese, cheese spreads, or cheese curds. Sweets and Desserts Corn syrup, sugars, honey, and molasses. Candy. Jam and jelly. Syrup. Sweetened cereals. Cookies, pies, cakes, donuts, muffins, and ice cream. Fats and Oils Butter, stick margarine, lard, shortening, ghee, or bacon fat. Coconut, palm kernel, or palm oils. Beverages Alcohol. Sweetened drinks (such as sodas, lemonade, and fruit drinks or punches). The items listed above may not be a complete list of foods and beverages to avoid. Contact your dietitian for more information.   This information is not intended to replace advice given to you by your health care provider. Make sure you discuss any questions you have with your health care provider.   Document Released: 01/21/2005 Document Revised: 02/11/2014 Document Reviewed: 04/21/2013 Elsevier Interactive Patient Education [employer].

## 2015-03-06 ENCOUNTER — Other Ambulatory Visit: Payer: Self-pay

## 2015-03-06 ENCOUNTER — Other Ambulatory Visit: Payer: Self-pay | Admitting: Family Medicine

## 2015-03-06 DIAGNOSIS — I10 Essential (primary) hypertension: Secondary | ICD-10-CM

## 2015-03-06 MED ORDER — AMLODIPINE BESYLATE 5 MG PO TABS: 5.0000 mg | ORAL_TABLET | Freq: Every day | ORAL | Status: DC

## 2015-03-06 MED FILL — ?AMLODIPINE BESYLATE 5 MG T: 5 | 30 days supply | Qty: 30 | Fill #0

## 2015-03-13 ENCOUNTER — Other Ambulatory Visit: Payer: Self-pay

## 2015-03-13 ENCOUNTER — Ambulatory Visit: Payer: Medicaid Other | Admitting: Occupational Therapy

## 2015-03-13 ENCOUNTER — Encounter: Payer: Self-pay | Admitting: Physical Therapy

## 2015-03-13 ENCOUNTER — Encounter: Payer: Self-pay | Admitting: Occupational Therapy

## 2015-03-13 ENCOUNTER — Ambulatory Visit: Payer: Medicaid Other | Attending: Physical Medicine & Rehabilitation | Admitting: Physical Therapy

## 2015-03-13 VITALS — BP 173/101

## 2015-03-13 VITALS — BP 166/99 | HR 69

## 2015-03-13 DIAGNOSIS — I6980 Unspecified sequelae of other cerebrovascular disease: Secondary | ICD-10-CM | POA: Diagnosis present

## 2015-03-13 DIAGNOSIS — R29898 Other symptoms and signs involving the musculoskeletal system: Secondary | ICD-10-CM | POA: Diagnosis present

## 2015-03-13 DIAGNOSIS — R279 Unspecified lack of coordination: Secondary | ICD-10-CM | POA: Diagnosis present

## 2015-03-13 DIAGNOSIS — M5432 Sciatica, left side: Secondary | ICD-10-CM | POA: Insufficient documentation

## 2015-03-13 DIAGNOSIS — R269 Unspecified abnormalities of gait and mobility: Secondary | ICD-10-CM | POA: Diagnosis not present

## 2015-03-13 DIAGNOSIS — Z7409 Other reduced mobility: Secondary | ICD-10-CM | POA: Diagnosis present

## 2015-03-13 DIAGNOSIS — R4189 Other symptoms and signs involving cognitive functions and awareness: Secondary | ICD-10-CM | POA: Insufficient documentation

## 2015-03-13 DIAGNOSIS — R201 Hypoesthesia of skin: Secondary | ICD-10-CM | POA: Diagnosis present

## 2015-03-13 DIAGNOSIS — R6889 Other general symptoms and signs: Secondary | ICD-10-CM | POA: Insufficient documentation

## 2015-03-13 DIAGNOSIS — IMO0002 Reserved for concepts with insufficient information to code with codable children: Secondary | ICD-10-CM

## 2015-03-13 NOTE — Therapy (Signed)
[facility] [address] [place], [place], [address] Phone: [phone]   Fax:  [phone]  Occupational Therapy Evaluation  Patient Details  Name: Michael Davenport MRN: [id number] Date of Birth: [birth year] Referring Provider: Dr. Alysia Penna  Encounter Date: 03/13/2015      OT End of Session - 03/13/15 1814    Visit Number 1   Number of Visits 8   Date for OT Re-Evaluation 04/17/15  pt to return to clinic in 2 weeks to hopefully allow for medicaid approval   Authorization Type medicaid pending   Authorization - Visit Number 1   Authorization - Number of Visits 8   OT Start Time [id number]   OT Stop Time 1625   OT Time Calculation (min) 55 min   Activity Tolerance Patient tolerated treatment well      Past Medical History  Diagnosis Date  . HTN (hypertension)   . ETOH abuse 01/23/2015  . Stroke [facility])     History reviewed. No pertinent past surgical history.  Filed Vitals:   03/13/15 1549  BP: 166/99  Pulse: 69    Visit Diagnosis:  Lack of coordination due to stroke  Impaired sensation  Impaired cognition  Impaired mobility and ADLs      Subjective Assessment - 03/13/15 1555    Patient Stated Goals get around better and not feel so awkward           [facility] OT Assessment - 03/13/15 1551    Assessment   Diagnosis L MCA CVA   Referring Provider Dr. Alysia Penna   Onset Date 01/16/15   Prior Therapy PT and OT   Precautions   Precautions Fall   Restrictions   Weight Bearing Restrictions No   Balance Screen   Has the patient fallen in the past 6 months No   Home  Environment   Family/patient expects to be discharged to: Private residence   Living Arrangements Spouse/significant other  32 year old nephew   Type of [place] One level   Bathroom Shower/Tub Tub/Shower unit   [employer] Standard   Additional Comments No equipment in bathroom and pt is standing in  shower.  Pt holds on to the wall during the shower.    Prior Function   Level of Independence Independent   Vocation On disability   ADL   Eating/Feeding Minimal assistance  cutting and uses L hand more   Grooming Modified independent  uses L hand more than he did before   Upper Body Bathing Modified independent   Lower Body Bathing Modified independent  balances against the wall   Upper Body Dressing Increased time   Lower Body Dressing Increased time   Toilet Tranfer Modified independent  pt reports he furniture walks   Toileting - Clothing Manipulation Modified independent   Toileting -  Hygiene Modified Independent   Tub/Shower Transfer Supervision/safety   ADL comments Pt leans against wall to shower and makes sure wife or nephew are in the house when he showers.  Pt limits walking during the day to the bathroom and back and occassionally furniture walks.   IADL   Shopping --  Pt states he never shops and didn't before   Light Housekeeping Does not participate in any housekeeping tasks   Meal Prep Able to complete simple warm meal prep  uses microwave in limited way   Community Mobility Relies on family or friends for transportation   Medication Management Is responsible  for taking medication in correct dosages at correct time   Financial Management Manages financial matters independently (budgets, writes checks, pays rent, bills goes to bank), collects and keeps track of income   Mobility   Mobility Status Needs assist   Mobility Status Comments Needs supervision   Written Expression   Dominant Hand Right   Handwriting 75% legible  name only   Vision - History   Baseline Vision Wears glasses all the time   Vision Assessment   Comment Pt denies any changes in vision after stroke   Activity Tolerance   Activity Tolerance Tolerate 30+ min activity without fatigue   Cognition   Overall Cognitive Status Within Functional Limits for tasks assessed  to be further assessed  functionally   Mini Mental State Exam  Wife  and pt reports slower to process and slower to verbally respond. Pt states at times what he wants to say and what comes out is not the same thing.  Feel pt could benefit from ST eval  and pt and wife in agreement.     Sensation   Light Touch Impaired by gross assessment   Hot/Cold Appears Intact   Proprioception Appears Intact   Coordination   Gross Motor Movements are Fluid and Coordinated Yes   Fine Motor Movements are Fluid and Coordinated No   Finger Nose Finger Test impaired   Tone   Assessment Location Right Upper Extremity   ROM / Strength   AROM / PROM / Strength AROM;Strength   AROM   Overall AROM  Within functional limits for tasks performed   Strength   Overall Strength Within functional limits for tasks performed   Overall Strength Comments BUE's   RUE Tone   RUE Tone Within Functional Limits                              OT Long Term Goals - 03/13/15 1804    OT LONG TERM GOAL #1   Title Pt will be mod I with HEP - 04/17/2015 (pt to return in 2 weeks to hopefully allow for Medicaid approval)   Baseline dependent   Status New   OT LONG TERM GOAL #2   Title Pt will be mod I for cutting food on plate using R hand as dominant   Baseline dependent   Status New   OT LONG TERM GOAL #3   Title Pt will demonstrate ability to complete fine motor tasks with minimal difficulty   Baseline maximal difficulty   Status New   OT LONG TERM GOAL #4   Title Pt will demonstrate ability to use vision mod I  to compensate for sensory impairment in R dominant hand   Baseline dependent   Status New   OT LONG TERM GOAL #5   Title Pt will be mod I for hot meal prep at ambulatory level   Baseline dependent - only able to fix cold snack and heat up prepared food in microwave   Status New   Long Term Additional Goals   Additional Long Term Goals Yes   OT LONG TERM GOAL #6   Title Pt will demonstrate adequate balance to  complete home management task mod I   Baseline dependent   Status New               Plan - 03/13/15 1809    Clinical Impression Statement Pt is a 55 year old male with h/o  of TIA, ETOH abuse, tobacco abuse, THC use, who is now s/p L MCA CVA. Pt was admitted to the [facility] on 01/16/2015 and discharged on 01/25/2015.  Pt presents today with the following deficits that impact his ability to complete ADL's, IADL's and leisure:  decreased coordination in R dominant UE, decreasd sensation, decreased functional use of RUE, decreased balance, decreased processing of new information. Pt will benefit from skilled OT to address these deficts to maixmize independence in ADL's and IADL's.    Pt will benefit from skilled therapeutic intervention in order to improve on the following deficits (Retired) Decreased balance;Decreased cognition;Decreased coordination;Decreased mobility;Decreased knowledge of use of DME;Impaired UE functional use;Impaired sensation   Rehab Potential Good   OT Frequency 2x / week   OT Duration 4 weeks  pt to return to clinic in 2 weeks to hopefully allow for Medicaid approval   OT Treatment/Interventions Self-care/ADL training;Therapeutic exercise;Neuromuscular education;DME and/or AE instruction;Therapist, nutritional;Therapeutic activities;Cognitive remediation/compensation;Patient/family education;Balance training   Plan intiate HEP   Consulted and Agree with Plan of Care Patient;Family member/caregiver   Family Member Consulted wife        Problem List Patient Active Problem List   Diagnosis Date Noted  . Status post stroke due to cerebrovascular disease 02/23/2015  . Gait disturbance, post-stroke 02/17/2015  . Cerebrovascular accident (CVA) due to occlusion of left middle cerebral artery ([facility]) 02/16/2015  . Tetrahydrocannabinol (THC) use disorder, mild, abuse 02/16/2015  . ETOH abuse 01/23/2015  . Tobacco use disorder 01/23/2015  . Stroke due to embolism  of right middle cerebral artery ([place]) 01/19/2015  . HTN (hypertension) 01/18/2015  . TIA (transient ischemic attack) 01/17/2015  . Alcohol abuse 01/17/2015  . Tobacco abuse 01/17/2015  . Elevated BP 01/17/2015  . Acute CVA (cerebrovascular accident) ([facility]) 01/17/2015  . Dyslipidemia     Quay Burow, OTR/L 03/13/2015, 6:17 PM  [facility] [address] [place], [place], [address] Phone: [phone]   Fax:  [phone]  Name: Michael Davenport MRN: [id number] Date of Birth: 09-Jun-1960

## 2015-03-14 NOTE — Patient Instructions (Signed)
WALKING  Walking is a great form of exercise to increase your strength, endurance and overall fitness.  A walking program can help you start slowly and gradually build endurance as you go.  Everyone's ability is different, so each person's starting point will be different.  You do not have to follow them exactly.  The are just samples. You should simply find out what's right for you and stick to that program.   In the beginning, you'll start off walking 2-3 times a day for short distances.  As you get stronger, you'll be walking further at just 1-2 times per day.  A. You Can Walk For A Certain Length Of Time Each Day    Walk 5 minutes 3 times per day.  Increase 2 minutes every 2 days (3 times per day).  Work up to 25-30 minutes (1-2 times per day).   Example:   Day 1-2 5 minutes 3 times per day   Day 7-8 12 minutes 2-3 times per day   Day 13-14 25 minutes 1-2 times per day  B. You Can Walk For a Certain Distance Each Day     Distance can be substituted for time.    Example:   3 trips to mailbox (at road)   3 trips to corner of block   3 trips around the block  C. Go to local [employer] and use the track.    Walk for distance .10 around track  Or time 10-30 minutes  D. Walk daily - short distances initially - gradually increase distance and frequency/day  Please only do the exercises that your therapist has initialed and dated

## 2015-03-14 NOTE — Therapy (Signed)
[facility] [address] [place], [place], [address] Phone: [phone]   Fax:  [phone]  Physical Therapy Evaluation  Patient Details  Name: Michael Davenport MRN: [id number] Date of Birth: [birth year] Referring Provider: Dr. Alysia Penna  Encounter Date: 03/13/2015      PT End of Session - 03/14/15 0750    Visit Number 1   Number of Visits 8   Date for PT Re-Evaluation 05/05/15   Authorization Type Medicaid pending   PT Start Time [id number]   PT Stop Time 1535   PT Time Calculation (min) 44 min      Past Medical History  Diagnosis Date  . HTN (hypertension)   . ETOH abuse 01/23/2015  . Stroke [facility])     History reviewed. No pertinent past surgical history.  Filed Vitals:   03/13/15 1500  BP: 173/101    Visit Diagnosis:  Abnormality of gait - Plan: PT plan of care cert/re-cert  Right leg weakness - Plan: PT plan of care cert/re-cert  Sciatica associated with disorder of lumbosacral spine, left - Plan: PT plan of care cert/re-cert  Decreased functional activity tolerance - Plan: PT plan of care cert/re-cert      Subjective Assessment - 03/14/15 0730    Subjective Pt had onset of numbness and tingling on Sunday, 01-15-15 -- pt fell 2 times initially - wife took pt to [facility] on 01-16-15 - CAT scan was negative and then he was transferred and admitted to [facility] on 01-16-15 - MRI was done which revealed a L middle cerebral artery CVA; was transferred to inpatient rehab - D/C'd on 01-25-15 to home; pt reports main residual deficits are decreased strength and functional use of RUE and significant numbness and tingling in RLE - pt states R leg "feels weird"                                                                                                                                 Patient is accompained by: Family member  wife Dianne   Pertinent History h/o 2 previous CVA's   Diagnostic tests CAT scan,  MRI   Patient Stated Goals Decrease L leg pain due to sciatica; increase sensation in RLE             [facility] PT Assessment - 03/14/15 0001    Assessment   Medical Diagnosis L MCA CVA   Referring Provider Dr. Alysia Penna   Onset Date/Surgical Date 01/15/15   Precautions   Precautions None   Balance Screen   Has the patient fallen in the past 6 months No   Has the patient had a decrease in activity level because of a fear of falling?  No   Is the patient reluctant to leave their home because of a fear of falling?  Yes   [place] residence   Type of [place]  Home Access Level entry   Home Layout One level   Home Equipment None   Prior Function   Level of Independence Independent   Vocation On disability   Strength   Overall Strength Within functional limits for tasks performed  for bil. LE's   Overall Strength Comments Bil. LE's   Ambulation/Gait   Ambulation/Gait Yes   Ambulation/Gait Assistance 6: Modified independent (Device/Increase time)   Ambulation Distance (Feet) 100 Feet   Assistive device None   Gait Pattern Decreased weight shift to right;Wide base of support   Ambulation Surface Level;Indoor   Gait velocity 3.21  10.19 secs   Stairs Yes   Stairs Assistance 5: Supervision   Stair Management Technique No rails   Number of Stairs 4   Height of Stairs 6   Ramp 5: Supervision   Curb 5: Supervision   Balance   Balance Assessed Yes   Standardized Balance Assessment   Standardized Balance Assessment Timed Up and Go Test   Timed Up and Go Test   Normal TUG (seconds) 12.75   High Level Balance   High Level Balance Comments Pt able to perform tandem stance for approx. 23 secs; single limb stance  10 secs on LLE; approx. 8 secs on RLE                           PT Education - 03/14/15 0749    Education provided Yes   Education Details instructed pt to increase activity at home - walk as much as  possible; recommended pt to increase water intake   Person(s) Educated Patient   Methods Explanation   Comprehension Verbalized understanding             PT Long Term Goals - 03/14/15 1423    PT LONG TERM GOAL #1   Title Pt will amb. 1000' on all surfaces modified independently without LOB with min. c/o fatigue.  (04-11-15)   Time 4   Period Weeks   Status New   PT LONG TERM GOAL #2   Title Incr. gait velocity to >/= 3.8 ft/sec without device for incr. gait efficiency.  (04-11-15)   Baseline 3.22 ft/sec with no device   Time 4   Period Weeks   Status New   PT LONG TERM GOAL #3   Title Report at least 50% improvement in pain in LLE.  (04-11-15)   Time 4   Period Weeks   Status New   PT LONG TERM GOAL #4   Title Independent in HEP for walking program, balance, strengthening and stretching.  (04-11-15)   Time 4   Period Weeks   Status New               Plan - 03/14/15 1256    Pt will benefit from skilled therapeutic intervention in order to improve on the following deficits Abnormal gait;Decreased activity tolerance;Decreased balance;Decreased mobility;Decreased strength;Impaired sensation;Pain;Decreased endurance;Impaired perceived functional ability   Rehab Potential Good   PT Frequency 2x / week   PT Duration 4 weeks   PT Treatment/Interventions ADLs/Self Care Home Management;Therapeutic exercise;Therapeutic activities;Functional mobility training;Stair training;Gait training;Balance training;Neuromuscular re-education;Patient/family education   PT Next Visit Plan Do DGI; begin high level balance skills for HEP and walking program; strengthening exs. for RLE   PT Home Exercise Plan walking program; high level balance skills   Consulted and Agree with Plan of Care Patient         Problem List Patient  Active Problem List   Diagnosis Date Noted  . Status post stroke due to cerebrovascular disease 02/23/2015  . Gait disturbance, post-stroke 02/17/2015  .  Cerebrovascular accident (CVA) due to occlusion of left middle cerebral artery ([place]) 02/16/2015  . Tetrahydrocannabinol (THC) use disorder, mild, abuse 02/16/2015  . ETOH abuse 01/23/2015  . Tobacco use disorder 01/23/2015  . Stroke due to embolism of right middle cerebral artery ([place]) 01/19/2015  . HTN (hypertension) 01/18/2015  . TIA (transient ischemic attack) 01/17/2015  . Alcohol abuse 01/17/2015  . Tobacco abuse 01/17/2015  . Elevated BP 01/17/2015  . Acute CVA (cerebrovascular accident) ([place]) 01/17/2015  . Dyslipidemia     Pat Sires, [place], PT 03/14/2015, 2:47 PM  [facility] [address] [place], [place], [address] Phone: [phone]   Fax:  [phone]  Name: Michael Davenport MRN: [id number] Date of Birth: 11/27/1960

## 2015-03-15 ENCOUNTER — Other Ambulatory Visit: Payer: Self-pay | Admitting: Family Medicine

## 2015-03-16 MED FILL — ?LISINOPRIL 10 MG TABLET: 10 | 30 days supply | Qty: 30 | Fill #0 | Status: TO

## 2015-03-20 ENCOUNTER — Ambulatory Visit: Payer: Self-pay | Admitting: Physical Therapy

## 2015-03-20 ENCOUNTER — Encounter: Payer: Self-pay | Admitting: Occupational Therapy

## 2015-03-23 MED FILL — ATORVASTATIN 40 MG TABLET: 40 | 30 days supply | Qty: 30 | Fill #1

## 2015-03-28 ENCOUNTER — Ambulatory Visit: Payer: Medicaid Other | Admitting: Occupational Therapy

## 2015-03-28 ENCOUNTER — Ambulatory Visit: Payer: Medicaid Other | Admitting: Physical Therapy

## 2015-03-28 DIAGNOSIS — R269 Unspecified abnormalities of gait and mobility: Secondary | ICD-10-CM

## 2015-03-28 NOTE — Therapy (Signed)
[facility] [address] [place], [place], [address] Phone: [phone]   Fax:  [phone]  Patient Details  Name: Michael Davenport MRN: [id number] Date of Birth: 10-01-1960 Referring Provider:  Dorena Dew, FNP  Encounter Date: 03/28/2015  Upon patient arrival, informed patient of potential financial responsibility due to pending Medicaid approval. Patient elected to defer PT at this time due to financial constraints.  De Nurse, SPT 03/28/2015, 3:13 PM  [facility] [address] [place], [place], [address] Phone: [phone]   Fax:  [phone]

## 2015-03-31 ENCOUNTER — Encounter: Payer: Medicaid Other | Attending: Physical Medicine & Rehabilitation

## 2015-03-31 ENCOUNTER — Ambulatory Visit (HOSPITAL_BASED_OUTPATIENT_CLINIC_OR_DEPARTMENT_OTHER): Payer: Medicaid Other | Admitting: Physical Medicine & Rehabilitation

## 2015-03-31 ENCOUNTER — Encounter: Payer: Self-pay | Admitting: Physical Medicine & Rehabilitation

## 2015-03-31 ENCOUNTER — Other Ambulatory Visit: Payer: Self-pay

## 2015-03-31 VITALS — BP 150/91 | HR 72

## 2015-03-31 DIAGNOSIS — Z8673 Personal history of transient ischemic attack (TIA), and cerebral infarction without residual deficits: Secondary | ICD-10-CM | POA: Diagnosis not present

## 2015-03-31 DIAGNOSIS — M5432 Sciatica, left side: Secondary | ICD-10-CM | POA: Diagnosis not present

## 2015-03-31 DIAGNOSIS — G8929 Other chronic pain: Secondary | ICD-10-CM | POA: Diagnosis present

## 2015-03-31 DIAGNOSIS — Z87898 Personal history of other specified conditions: Secondary | ICD-10-CM | POA: Insufficient documentation

## 2015-03-31 DIAGNOSIS — I69351 Hemiplegia and hemiparesis following cerebral infarction affecting right dominant side: Secondary | ICD-10-CM | POA: Diagnosis not present

## 2015-03-31 DIAGNOSIS — F1010 Alcohol abuse, uncomplicated: Secondary | ICD-10-CM | POA: Insufficient documentation

## 2015-03-31 DIAGNOSIS — R269 Unspecified abnormalities of gait and mobility: Secondary | ICD-10-CM | POA: Insufficient documentation

## 2015-03-31 DIAGNOSIS — F17210 Nicotine dependence, cigarettes, uncomplicated: Secondary | ICD-10-CM | POA: Insufficient documentation

## 2015-03-31 DIAGNOSIS — I10 Essential (primary) hypertension: Secondary | ICD-10-CM | POA: Insufficient documentation

## 2015-03-31 MED ORDER — GABAPENTIN 100 MG PO CAPS: 100.0000 mg | ORAL_CAPSULE | Freq: Three times a day (TID) | ORAL | Status: DC

## 2015-03-31 MED ORDER — AMLODIPINE BESYLATE 5 MG PO TABS: 5.0000 mg | ORAL_TABLET | Freq: Every day | ORAL | Status: DC

## 2015-03-31 MED FILL — GABAPENTIN 100 MG CAPSULE: 100 | 30 days supply | Qty: 90 | Fill #0

## 2015-03-31 MED FILL — ?AMLODIPINE BESYLATE 5 MG T: 5 | 30 days supply | Qty: 30 | Fill #0

## 2015-03-31 NOTE — Telephone Encounter (Signed)
[facility] Thanks!

## 2015-03-31 NOTE — Patient Instructions (Signed)

## 2015-03-31 NOTE — Progress Notes (Signed)
Subjective:    Patient ID: Michael Davenport, male    DOB: 06/03/1960, 55 y.o.   MRN: [id number] 54 y.o. RH-male with history of HTN, ETOH abuse, medical non-compliance who was admitted on 01/16/15 with new onset of right sided weakness and falls. UDS positive for ETOH and THC. MRI/MRA brain done revealing acute L-MCA lenticulostriate territory infarct with left M1 MCA large vessel occlusion and widespread atherosclerotic stenotic lesions.  Dr. [name] consulted and recommended ASA [id number] mg for left PLIC infarct due to small vessel disease and aggressive risk factor management HPI Michael Davenport returns today after seeing me about 1 month ago. In the interval time he has seen his provider. Patient has also seen physical therapy for evaluation. No therapy was started because of lack of insurance. Patient is independent with all his self-care and mobility. He is ambulating without an assistive device. He has not fallen. He states he's cut down on his smoking but still drinks pretty much on a daily basis. He's had some low back pain and left lower extremity pain. Lower extremity pain is described as burning. He states he's had a prior history of back problems with sciatica.He's had no left lower extremity weakness. Pain Inventory Average Pain 8 Pain Right Now 8 My pain is constant, dull, tingling and aching  In the last 24 hours, has pain interfered with the following? General activity 8 Relation with others 8 Enjoyment of life 8 What TIME of day is your pain at its worst? evening Sleep (in general) Poor  Pain is worse with: walking, standing and some activites Pain improves with: rest Relief from Meds: no pain med  Mobility walk without assistance  Function not employed: date last employed . I need assistance with the following:  meal prep, household duties and shopping  Neuro/Psych weakness numbness tingling trouble walking depression anxiety  Prior Studies Any changes since last visit?   no  Physicians involved in your care Any changes since last visit?  no   Family History  Problem Relation Age of Onset  . Hypertension Other   . Hypertension Brother   . Heart attack Brother   . Heart attack Father   . Heart disease Father   . Diabetes Sister   . Hyperlipidemia Sister   . Heart attack Sister    Social History   Social History  . Marital Status: Single    Spouse Name: N/A  . Number of Children: N/A  . Years of Education: N/A   Social History Main Topics  . Smoking status: Current Every Day Smoker -- 0.25 packs/day    Types: Cigarettes  . Smokeless tobacco: Former Systems developer    Quit date: 01/15/2015  . Alcohol Use: 14.4 oz/week    24 Cans of beer per week     Comment: 6-8 cans of beer a day, pt stated he was having 9 beers a day  . Drug Use: No  . Sexual Activity: Not Asked   Other Topics Concern  . None   Social History Narrative   History reviewed. No pertinent past surgical history. Past Medical History  Diagnosis Date  . HTN (hypertension)   . ETOH abuse 01/23/2015  . Stroke ([facility])    BP 150/91 mmHg  Pulse 72  SpO2 100%  Opioid Risk Score:   Fall Risk Score:  `1  Depression screen PHQ 2/9  Depression screen PHQ 2/9 02/17/2015  Decreased Interest 2  Down, Depressed, Hopeless 3  PHQ - 2 Score 5  Altered sleeping 0  Tired, decreased energy 3  Change in appetite 1  Feeling bad or failure about yourself  3  Trouble concentrating 1  Moving slowly or fidgety/restless 2  Suicidal thoughts 1  PHQ-9 Score 16  Difficult doing work/chores Somewhat difficult     Review of Systems  All other systems reviewed and are negative.      Objective:   Physical Exam  Nursing note and vitals reviewed.  Neuro:  Eyes without evidence of nystagmus  Tone is normal without evidence of spasticity Cerebellar exam shows no evidence of ataxia on finger nose finger or heel to shin testing Has problems with tandem gait, is able to toe walk and heel walk  however, Negative Romberg  Motor strength is 5/5 in bilateral deltoid, biceps, triceps, finger flexors and extensors, wrist flexors and extensors, hip flexors, knee flexors and extensors, ankle dorsiflexors, plantar flexors, invertors and evertors, toe flexors and extensors  Sensory exam is normal to pinprick, proprioception and light touch in the upper and lower limbs   Cranial nerves II- Visual fields are intact to confrontation testing, no blurring of vision III- no evidence of ptosis, upward, downward and medial gaze intact IV- no vertical diplopia or head tilt V- no facial numbness or masseter weakness VI- no pupil abduction weakness VII- no facial droop, good lid closure VII- normal auditory acuity IX- no pharygeal weakness, gag nl X- no pharyngeal weakness, no hoarseness XI- no trap or SCM weakness XII- no glossal weakness  Lumbar spine has no tenderness palpation there is no evidence of lumbar scoliosis or thoracic scoliosis. His lumbar range of motion 50% flexion extension lateral bending and rotation Negative straight leg raising test  Deep tendon reflexes are 3+ in the right biceps triceps brachial radialis patellar and Achilles and 2+ on the left side in the same areas     Assessment & Plan:  1. History of left PL IC infarct with essential resolution of symptoms. He does still have some mild tandem gait problems but there may be other causes given his history of alcoholism. I really don't think he requires any further outpatient rehabilitation.  2. Chronic low back pain with some sciatic symptoms. We'll trial some gabapentin 100 mg 3 times a day he'll follow-up with his primary care physician and if this is helpful he can continue this. We'll not require any further physical medicine rehabilitation follow-up. Have given him some lumbar exercises as well.

## 2015-04-06 ENCOUNTER — Ambulatory Visit (INDEPENDENT_AMBULATORY_CARE_PROVIDER_SITE_OTHER): Payer: Medicaid Other | Admitting: Family Medicine

## 2015-04-06 VITALS — BP 158/94 | HR 86 | Temp 98.1°F | Resp 16 | Ht 66.0 in | Wt 171.0 lb

## 2015-04-06 DIAGNOSIS — M5441 Lumbago with sciatica, right side: Secondary | ICD-10-CM

## 2015-04-06 DIAGNOSIS — I10 Essential (primary) hypertension: Secondary | ICD-10-CM

## 2015-04-06 DIAGNOSIS — M5442 Lumbago with sciatica, left side: Secondary | ICD-10-CM | POA: Diagnosis not present

## 2015-04-06 DIAGNOSIS — G8929 Other chronic pain: Secondary | ICD-10-CM

## 2015-04-06 DIAGNOSIS — Z8673 Personal history of transient ischemic attack (TIA), and cerebral infarction without residual deficits: Secondary | ICD-10-CM

## 2015-04-06 DIAGNOSIS — F17200 Nicotine dependence, unspecified, uncomplicated: Secondary | ICD-10-CM

## 2015-04-06 MED ORDER — GABAPENTIN 300 MG PO CAPS: 300.0000 mg | ORAL_CAPSULE | Freq: Three times a day (TID) | ORAL | Status: DC

## 2015-04-06 MED ORDER — AMLODIPINE BESYLATE 10 MG PO TABS: 10.0000 mg | ORAL_TABLET | Freq: Every day | ORAL | Status: DC

## 2015-04-06 MED FILL — ?AMLODIPINE BESYLATE 10 MG: 10 | 30 days supply | Qty: 30 | Fill #0

## 2015-04-06 NOTE — Progress Notes (Signed)
Subjective:    Patient ID: Michael Davenport, male    DOB: 1960/07/03, 55 y.o.   MRN: [id number]  Hypertension Pertinent negatives include no chest pain, headaches, palpitations or shortness of breath.  Back Pain Associated symptoms include weakness (right side). Pertinent negatives include no chest pain, fever or headaches.   Michael Davenport, a 55 year old male with a history of uncontrolled hypertension, alcohol abuse and stroke presents for a 1 month follow up of hypertension and chronic back pain. Michael Davenport is accompanied by his wife. He is taking antihypertensive medications consistently and following a low sodium diet.He is currently not checking blood pressures at home. Patient denies headache, dizziness,  chest pain, dyspnea, fatigue, irregular heart beat, lower extremity edema, near-syncope, palpitations, syncope and tachypnea.  Cardiovascular risk factors include: dyslipidemia, hypertension, male gender, sedentary lifestyle and smoking/ tobacco exposure.   Patient was admitted to hospital inpatient services on 01/16/2015 with a new onset of right sided weakness and falls. Brain MRI  revealed an acute  Left PLIC infarct with left M1 MCA large vessel occlusion and widespread atherosclerotic stenotic lesions. Patient was started on ASA 325 mg and Lipitor.  Patient is currently followed by Dr. Jethro Bolus, neurology. Patient has had several  physical therapy sessions, but is unable to continue due to lack of payer source.  He was evaluated by Dr. Laurena Spies on 03/31/2015, who states that patient does not warrant any further outpatient rehabilitation. He was starated on Gabapentin 100 mg TID for chronic back pain with sciatica. Michael Davenport states that he has been taking medication consistently with minimal relief. He denies fatigue, fever, dysuria, or incontinence urine/stool. He states that back pain is worsened by prolonged sitting, standing, or pivoting. He describes pain as burning and  shooting. His current pain intensity is 5/10 primarily to lower back and radiating to lower extremities.  Past Medical History  Diagnosis Date  . HTN (hypertension)   . ETOH abuse 01/23/2015  . Stroke [facility])    Social History   Social History  . Marital Status: Single    Spouse Name: N/A  . Number of Children: N/A  . Years of Education: N/A   Occupational History  . Not on file.   Social History Main Topics  . Smoking status: Current Every Day Smoker -- 0.25 packs/day    Types: Cigarettes  . Smokeless tobacco: Former Systems developer    Quit date: 01/15/2015  . Alcohol Use: 14.4 oz/week    24 Cans of beer per week     Comment: 6-8 cans of beer a day, pt stated he was having 9 beers a day  . Drug Use: No  . Sexual Activity: Not on file   Other Topics Concern  . Not on file   Social History Narrative   Immunization History  Administered Date(s) Administered  . Influenza,inj,Quad PF,36+ Mos 01/20/2015  No Known Allergies Review of Systems  Constitutional: Negative for fever, fatigue and unexpected weight change.  HENT: Negative.   Eyes: Negative.  Negative for visual disturbance.  Respiratory: Negative.  Negative for shortness of breath and wheezing.   Cardiovascular: Negative.  Negative for chest pain, palpitations and leg swelling.  Gastrointestinal: Negative.   Endocrine: Negative.  Negative for polydipsia, polyphagia and polyuria.  Genitourinary: Negative.   Musculoskeletal: Positive for back pain.  Skin: Negative.   Allergic/Immunologic: Negative.   Neurological: Positive for weakness (right side). Negative for dizziness, tremors, syncope, facial asymmetry, speech difficulty, light-headedness and headaches.  Hematological: Negative.  Psychiatric/Behavioral: Negative.      Objective:   Physical Exam  Constitutional: He is oriented to person, place, and time. He appears well-developed and well-nourished.  HENT:  Head: Normocephalic and atraumatic.  Right Ear: External  ear normal.  Left Ear: External ear normal.  Nose: Nose normal.  Mouth/Throat: Oropharynx is clear and moist. Abnormal dentition (partially edentulous).  Eyes: Conjunctivae and EOM are normal. Pupils are equal, round, and reactive to light.  Neck: Normal range of motion. Neck supple.  Cardiovascular: Normal rate, regular rhythm, normal heart sounds and intact distal pulses.   Pulmonary/Chest: Effort normal and breath sounds normal.  Abdominal: Soft. Bowel sounds are normal.  Musculoskeletal:       Lumbar back: He exhibits decreased range of motion and pain. He exhibits no tenderness and no spasm.  Neurological: He is alert and oriented to person, place, and time. He has normal reflexes. He exhibits abnormal muscle tone (right side). Coordination and gait abnormal.  Skin: Skin is warm and dry.  Psychiatric: He has a normal mood and affect. His behavior is normal. Judgment and thought content normal.   BP 158/94 mmHg  Pulse 86  Temp(Src) 98.1 F (36.7 C) (Oral)  Resp 16  Ht 5\' 6"  (1.676 m)  Wt 171 lb (77.565 kg)  BMI 27.61 kg/m2    Assessment & Plan:   1. Chronic bilateral low back pain with bilateral sciatica Will increase gabapentin to 300 mg 3 times daily. Will follow up in 1 month. Patient encouraged to continue back exercises.  - gabapentin (NEURONTIN) 300 MG capsule; Take 1 capsule (300 mg total) by mouth 3 (three) times daily.  Dispense: 90 capsule; Refill: 0  2. Essential hypertension Blood pressure is above goal on current medication regimen. Will increase Amlodipine to 10 mg daily. The patient is asked to make an attempt to improve diet and exercise patterns to aid in medical management of this problem. Patient was given a blood pressure wrist monitor. He was advised to maintain a daily blood pressure diary. Will follow up in 1 month.  - amLODipine (NORVASC) 10 MG tablet; Take 1 tablet (10 mg total) by mouth daily.  Dispense: 30 tablet; Refill: 0  3. Status post stroke due  to cerebrovascular disease Michael Davenport is to continue ASA 325 and Lipitor 40 mg daily for stroke prevention. Patient encouraged to check BP at home and bring list to follow up appointment. Will check lipid panel at follow up appointment.   4. Tobacco use disorder Smoking cessation instruction/counseling given:  counseled patient on the dangers of tobacco use, advised patient to stop smoking, and reviewed strategies to maximize success    RTC: 1 month for hypertension and chronic low back pain with sciatica   Ayen Viviano M, FNP   The patient was given clear instructions to go to ER or return to medical center if symptoms do not improve, worsen or new problems develop. The patient verbalized understanding. Will notify patient with laboratory results.

## 2015-04-10 ENCOUNTER — Encounter: Payer: Self-pay | Admitting: Family Medicine

## 2015-04-10 DIAGNOSIS — M5442 Lumbago with sciatica, left side: Secondary | ICD-10-CM

## 2015-04-10 DIAGNOSIS — G8929 Other chronic pain: Secondary | ICD-10-CM | POA: Insufficient documentation

## 2015-04-10 DIAGNOSIS — M5441 Lumbago with sciatica, right side: Secondary | ICD-10-CM

## 2015-04-14 MED FILL — ?LISINOPRIL 10 MG TABLET: 10 | 30 days supply | Qty: 30 | Fill #1 | Status: TO

## 2015-04-14 MED FILL — GABAPENTIN 300 MG CAPSULE: 300 | 30 days supply | Qty: 90 | Fill #0

## 2015-04-14 MED FILL — ?ATORVASTATIN 40MG TABLET: 40 | 30 days supply | Qty: 30 | Fill #2

## 2015-05-12 ENCOUNTER — Ambulatory Visit (INDEPENDENT_AMBULATORY_CARE_PROVIDER_SITE_OTHER): Payer: Medicaid Other | Admitting: Family Medicine

## 2015-05-12 ENCOUNTER — Encounter: Payer: Self-pay | Admitting: Family Medicine

## 2015-05-12 VITALS — BP 132/78 | HR 78 | Temp 98.0°F | Resp 16 | Ht 66.0 in | Wt 170.0 lb

## 2015-05-12 DIAGNOSIS — F17200 Nicotine dependence, unspecified, uncomplicated: Secondary | ICD-10-CM

## 2015-05-12 DIAGNOSIS — G8929 Other chronic pain: Secondary | ICD-10-CM | POA: Diagnosis not present

## 2015-05-12 DIAGNOSIS — M5441 Lumbago with sciatica, right side: Secondary | ICD-10-CM | POA: Diagnosis not present

## 2015-05-12 DIAGNOSIS — M5442 Lumbago with sciatica, left side: Secondary | ICD-10-CM

## 2015-05-12 DIAGNOSIS — I10 Essential (primary) hypertension: Secondary | ICD-10-CM

## 2015-05-12 LAB — POCT URINALYSIS DIP (DEVICE)
BILIRUBIN URINE: NEGATIVE
GLUCOSE, UA: NEGATIVE mg/dL
Hgb urine dipstick: NEGATIVE
Ketones, ur: NEGATIVE mg/dL
LEUKOCYTES UA: NEGATIVE
NITRITE: NEGATIVE
Protein, ur: NEGATIVE mg/dL
Specific Gravity, Urine: <=1.005 (ref 1.005–1.030)
UROBILINOGEN UA: 0.2 mg/dL (ref 0.0–1.0)
pH: 5.5 (ref 5.0–8.0)

## 2015-05-12 LAB — COMPLETE METABOLIC PANEL WITH GFR
ALK PHOS: 55 U/L (ref 40–115)
ALT: 32 U/L (ref 9–46)
AST: 34 U/L (ref 10–35)
Albumin: 4.7 g/dL (ref 3.6–5.1)
BILIRUBIN TOTAL: 0.6 mg/dL (ref 0.2–1.2)
BUN: 8 mg/dL (ref 7–25)
CALCIUM: 9.9 mg/dL (ref 8.6–10.3)
CHLORIDE: 105 mmol/L (ref 98–110)
CO2: 25 mmol/L (ref 20–31)
CREATININE: 1.02 mg/dL (ref 0.70–1.33)
GFR, Est African American: >89 mL/min (ref >=60)
GFR, Est Non African American: 82 mL/min (ref >=60)
Glucose, Bld: 68 mg/dL (ref 65–99)
Potassium: 4.5 mmol/L (ref 3.5–5.3)
Sodium: 142 mmol/L (ref 135–146)
Total Protein: 7.2 g/dL (ref 6.1–8.1)

## 2015-05-12 MED ORDER — AMLODIPINE BESYLATE 10 MG PO TABS: 10.0000 mg | ORAL_TABLET | Freq: Every day | ORAL | Status: DC

## 2015-05-12 MED ORDER — KETOROLAC TROMETHAMINE 60 MG/2ML IM SOLN
60.0000 mg | Freq: Once | INTRAMUSCULAR | Status: AC
  Administered 2015-05-12: 60 mg via INTRAMUSCULAR

## 2015-05-12 MED ORDER — TRAMADOL HCL 50 MG PO TABS: 50.0000 mg | ORAL_TABLET | Freq: Three times a day (TID) | ORAL | Status: DC | PRN

## 2015-05-12 MED ORDER — LISINOPRIL 10 MG PO TABS
10.0000 mg | ORAL_TABLET | Freq: Every day | ORAL | Status: DC
Start: 2015-05-12 — End: 2015-08-18

## 2015-05-12 MED ORDER — GABAPENTIN 300 MG PO CAPS: 300.0000 mg | ORAL_CAPSULE | Freq: Three times a day (TID) | ORAL | Status: DC

## 2015-05-12 NOTE — Patient Instructions (Signed)
Will start Tramadol 50 mg every 6-8 hours as needed for severe pain to lower back.  Refrain from drinking alcohol, driving, or operating machinery while taking medication.  DASH Eating Plan DASH stands for "Dietary Approaches to Stop Hypertension." The DASH eating plan is a healthy eating plan that has been shown to reduce high blood pressure (hypertension). Additional health benefits may include reducing the risk of type 2 diabetes mellitus, heart disease, and stroke. The DASH eating plan may also help with weight loss. WHAT DO I NEED TO KNOW ABOUT THE DASH EATING PLAN? For the DASH eating plan, you will follow these general guidelines:  Choose foods with a percent daily value for sodium of less than 5% (as listed on the food label).  Use salt-free seasonings or herbs instead of table salt or sea salt.  Check with your health care provider or pharmacist before using salt substitutes.  Eat lower-sodium products, often labeled as "lower sodium" or "no salt added."  Eat fresh foods.  Eat more vegetables, fruits, and low-fat dairy products.  Choose whole grains. Look for the word "whole" as the first word in the ingredient list.  Choose fish and skinless chicken or [place] more often than red meat. Limit fish, poultry, and meat to 6 oz (170 g) each day.  Limit sweets, desserts, sugars, and sugary drinks.  Choose heart-healthy fats.  Limit cheese to 1 oz (28 g) per day.  Eat more home-cooked food and less restaurant, buffet, and fast food.  Limit fried foods.  Cook foods using methods other than frying.  Limit canned vegetables. If you do use them, rinse them well to decrease the sodium.  When eating at a restaurant, ask that your food be prepared with less salt, or no salt if possible. WHAT FOODS CAN I EAT? Seek help from a dietitian for individual calorie needs. Grains Whole grain or whole wheat bread. Brown rice. Whole grain or whole wheat pasta. Quinoa, bulgur, and whole grain  cereals. Low-sodium cereals. Corn or whole wheat flour tortillas. Whole grain cornbread. Whole grain crackers. Low-sodium crackers. Vegetables Fresh or frozen vegetables (raw, steamed, roasted, or grilled). Low-sodium or reduced-sodium tomato and vegetable juices. Low-sodium or reduced-sodium tomato sauce and paste. Low-sodium or reduced-sodium canned vegetables.  Fruits All fresh, canned (in natural juice), or frozen fruits. Meat and Other Protein Products Ground beef (85% or leaner), grass-fed beef, or beef trimmed of fat. Skinless chicken or [place]. Ground chicken or [place]. Pork trimmed of fat. All fish and seafood. Eggs. Dried beans, peas, or lentils. Unsalted nuts and seeds. Unsalted canned beans. Dairy Low-fat dairy products, such as skim or 1% milk, 2% or reduced-fat cheeses, low-fat ricotta or cottage cheese, or plain low-fat yogurt. Low-sodium or reduced-sodium cheeses. Fats and Oils Tub margarines without trans fats. Light or reduced-fat mayonnaise and salad dressings (reduced sodium). Avocado. Safflower, olive, or canola oils. Natural peanut or almond butter. Other Unsalted popcorn and pretzels. The items listed above may not be a complete list of recommended foods or beverages. Contact your dietitian for more options. WHAT FOODS ARE NOT RECOMMENDED? Grains White bread. White pasta. White rice. Refined cornbread. Bagels and croissants. Crackers that contain trans fat. Vegetables Creamed or fried vegetables. Vegetables in a cheese sauce. Regular canned vegetables. Regular canned tomato sauce and paste. Regular tomato and vegetable juices. Fruits Dried fruits. Canned fruit in light or heavy syrup. Fruit juice. Meat and Other Protein Products Fatty cuts of meat. Ribs, chicken wings, bacon, sausage, bologna, salami, chitterlings, fatback, hot  dogs, bratwurst, and packaged luncheon meats. Salted nuts and seeds. Canned beans with salt. Dairy Whole or 2% milk, cream, half-and-half, and  cream cheese. Whole-fat or sweetened yogurt. Full-fat cheeses or blue cheese. Nondairy creamers and whipped toppings. Processed cheese, cheese spreads, or cheese curds. Condiments Onion and garlic salt, seasoned salt, table salt, and sea salt. Canned and packaged gravies. Worcestershire sauce. Tartar sauce. Barbecue sauce. Teriyaki sauce. Soy sauce, including reduced sodium. Steak sauce. Fish sauce. Oyster sauce. Cocktail sauce. Horseradish. Ketchup and mustard. Meat flavorings and tenderizers. Bouillon cubes. Hot sauce. Tabasco sauce. Marinades. Taco seasonings. Relishes. Fats and Oils Butter, stick margarine, lard, shortening, ghee, and bacon fat. Coconut, palm kernel, or palm oils. Regular salad dressings. Other Pickles and olives. Salted popcorn and pretzels. The items listed above may not be a complete list of foods and beverages to avoid. Contact your dietitian for more information. WHERE CAN I FIND MORE INFORMATION? National Heart, Lung, and Blood Institute: travelstabloid.com   This information is not intended to replace advice given to you by your health care provider. Make sure you discuss any questions you have with your health care provider.   Document Released: 01/10/2011 Document Revised: 02/11/2014 Document Reviewed: 11/25/2012 Elsevier Interactive Patient Education 2016 [employer] Back Pain, Adult Back pain is very common in adults.The cause of back pain is rarely dangerous and the pain often gets better over time.The cause of your back pain may not be known. Some common causes of back pain include:  Strain of the muscles or ligaments supporting the spine.  Wear and tear (degeneration) of the spinal disks.  Arthritis.  Direct injury to the back. For many people, back pain may return. Since back pain is rarely dangerous, most people can learn to manage this condition on their own. HOME CARE INSTRUCTIONS Watch your back pain for any  changes. The following actions may help to lessen any discomfort you are feeling:  Remain active. It is stressful on your back to sit or stand in one place for long periods of time. Do not sit, drive, or stand in one place for more than 30 minutes at a time. Take short walks on even surfaces as soon as you are able.Try to increase the length of time you walk each day.  Exercise regularly as directed by your health care provider. Exercise helps your back heal faster. It also helps avoid future injury by keeping your muscles strong and flexible.  Do not stay in bed.Resting more than 1-2 days can delay your recovery.  Pay attention to your body when you bend and lift. The most comfortable positions are those that put less stress on your recovering back. Always use proper lifting techniques, including:  Bending your knees.  Keeping the load close to your body.  Avoiding twisting.  Find a comfortable position to sleep. Use a firm mattress and lie on your side with your knees slightly bent. If you lie on your back, put a pillow under your knees.  Avoid feeling anxious or stressed.Stress increases muscle tension and can worsen back pain.It is important to recognize when you are anxious or stressed and learn ways to manage it, such as with exercise.  Take medicines only as directed by your health care provider. Over-the-counter medicines to reduce pain and inflammation are often the most helpful.Your health care provider may prescribe muscle relaxant drugs.These medicines help dull your pain so you can more quickly return to your normal activities and healthy exercise.  Apply ice to the injured  area:  Put ice in a plastic bag.  Place a towel between your skin and the bag.  Leave the ice on for 20 minutes, 2-3 times a day for the first 2-3 days. After that, ice and heat may be alternated to reduce pain and spasms.  Maintain a healthy weight. Excess weight puts extra stress on your back and  makes it difficult to maintain good posture. SEEK MEDICAL CARE IF:  You have pain that is not relieved with rest or medicine.  You have increasing pain going down into the legs or buttocks.  You have pain that does not improve in one week.  You have night pain.  You lose weight.  You have a fever or chills. SEEK IMMEDIATE MEDICAL CARE IF:   You develop new bowel or bladder control problems.  You have unusual weakness or numbness in your arms or legs.  You develop nausea or vomiting.  You develop abdominal pain.  You feel faint.   This information is not intended to replace advice given to you by your health care provider. Make sure you discuss any questions you have with your health care provider.   Document Released: 01/21/2005 Document Revised: 02/11/2014 Document Reviewed: 05/25/2013 Elsevier Interactive Patient Education [employer].

## 2015-05-12 NOTE — Progress Notes (Signed)
Subjective:    Patient ID: Michael Davenport, male    DOB: [birth year], 55 y.o.   MRN: [id number]  HPI [name], a 55 year old male with a history of uncontrolled hypertension, alcohol abuse and stroke presents for a 1 month follow up of hypertension and chronic back pain. [name] is accompanied by his wife. He is taking antihypertensive medications consistently and following a low sodium diet.He is currently not checking blood pressures at home. Patient denies headache, dizziness,  chest pain, dyspnea, fatigue, irregular heart beat, lower extremity edema, near-syncope, palpitations, syncope and tachypnea.  Cardiovascular risk factors include: dyslipidemia, hypertension, male gender, sedentary lifestyle and smoking/ tobacco exposure.    Patient continues to complain of low back pain with sciatica.  Symptoms have been present for several years and include numbness in left lower extremity.  Patient states that he sustained an injury on his job years ago.  Symptoms are worst with prolonged standing. Exacerbating factors identifiable by patient are bending backwards, bending forwards, bending sideways, recumbency and running.  He was starated on Gabapentin 300 mg TID for chronic back pain with sciatica. [name] states that he has been taking medication consistently with minimal relief. He denies fatigue, fever, dysuria, or incontinence urine/stool.  Past Medical History  Diagnosis Date  . HTN (hypertension)   . ETOH abuse 01/23/2015  . Stroke [facility])    Social History   Social History  . Marital Status: Single    Spouse Name: N/A  . Number of Children: N/A  . Years of Education: N/A   Occupational History  . Not on file.   Social History Main Topics  . Smoking status: Current Every Day Smoker -- 0.25 packs/day    Types: Cigarettes  . Smokeless tobacco: Former Systems developer    Quit date: 01/15/2015  . Alcohol Use: 14.4 oz/week    24 Cans of beer per week     Comment: 6-8 cans of beer a  day, pt stated he was having 9 beers a day  . Drug Use: No  . Sexual Activity: Not on file   Other Topics Concern  . Not on file   Social History Narrative   Immunization History  Administered Date(s) Administered  . Influenza,inj,Quad PF,36+ Mos 01/20/2015  No Known Allergies Review of Systems  Constitutional: Negative for fever, fatigue and unexpected weight change.  HENT: Negative.   Eyes: Negative.  Negative for visual disturbance.  Respiratory: Negative.  Negative for shortness of breath and wheezing.   Cardiovascular: Negative.  Negative for chest pain, palpitations and leg swelling.  Gastrointestinal: Negative.   Endocrine: Negative.  Negative for polydipsia, polyphagia and polyuria.  Genitourinary: Negative.   Musculoskeletal: Positive for back pain.  Skin: Negative.   Allergic/Immunologic: Negative.   Neurological: Positive for weakness (right side). Negative for dizziness, tremors, syncope, facial asymmetry, speech difficulty, light-headedness and headaches.  Hematological: Negative.   Psychiatric/Behavioral: Negative.      Objective:   Physical Exam  Constitutional: He is oriented to person, place, and time. He appears well-developed and well-nourished.  HENT:  Head: Normocephalic and atraumatic.  Right Ear: External ear normal.  Left Ear: External ear normal.  Nose: Nose normal.  Mouth/Throat: Oropharynx is clear and moist. Abnormal dentition (partially edentulous).  Eyes: Conjunctivae and EOM are normal. Pupils are equal, round, and reactive to light.  Neck: Normal range of motion. Neck supple.  Cardiovascular: Normal rate, regular rhythm, normal heart sounds and intact distal pulses.   Pulmonary/Chest: Effort normal and  breath sounds normal.  Abdominal: Soft. Bowel sounds are normal.  Musculoskeletal:       Lumbar back: He exhibits decreased range of motion and pain. He exhibits no tenderness and no spasm.  Neurological: He is alert and oriented to person,  place, and time. He has normal reflexes. He exhibits abnormal muscle tone (right side). Coordination and gait abnormal.  Skin: Skin is warm and dry.  Psychiatric: He has a normal mood and affect. His behavior is normal. Judgment and thought content normal.   BP 132/78 mmHg  Pulse 78  Temp(Src) 98 F (36.7 C) (Oral)  Resp 16  Ht 5\' 6"  (1.676 m)  Wt 170 lb (77.111 kg)  BMI 27.45 kg/m2  SpO2 100%    Assessment & Plan:  1. Chronic bilateral low back pain with bilateral sciatica - traMADol (ULTRAM) 50 MG tablet; Take 1 tablet (50 mg total) by mouth every 8 (eight) hours as needed.  Dispense: 30 tablet; Refill: 0 - gabapentin (NEURONTIN) 300 MG capsule; Take 1 capsule (300 mg total) by mouth 3 (three) times daily.  Dispense: 90 capsule; Refill: 1 - ketorolac (TORADOL) injection 60 mg; Inject 2 mLs (60 mg total) into the muscle once.  2. Essential hypertension Blood pressure is at goal on current medication regimen. Will continue medications. The patient is asked to make an attempt to improve diet and exercise patterns to aid in medical management of this problem.  - amLODipine (NORVASC) 10 MG tablet; Take 1 tablet (10 mg total) by mouth daily.  Dispense: 90 tablet; Refill: 1 - lisinopril (PRINIVIL,ZESTRIL) 10 MG tablet; Take 1 tablet (10 mg total) by mouth daily with supper.  Dispense: 90 tablet; Refill: 1 - COMPLETE METABOLIC PANEL WITH GFR  2. Essential hypertension Blood pressure is above goal on current medication regimen. Will increase Amlodipine to 10 mg daily. The patient is asked to make an attempt to improve diet and exercise patterns to aid in medical management of this problem. Patient was given a blood pressure wrist monitor. He was advised to maintain a daily blood pressure diary. Will follow up in 1 month.  - amLODipine (NORVASC) 10 MG tablet; Take 1 tablet (10 mg total) by mouth daily.  Dispense: 30 tablet; Refill: 0  3. Tobacco use disorder Smoking cessation  instruction/counseling given:  counseled patient on the dangers of tobacco use, advised patient to stop smoking, and reviewed strategies to maximize success    RTC: 1 month for hypertension and chronic low back pain with sciatica   Shepard Keltz M, FNP   The patient was given clear instructions to go to ER or return to medical center if symptoms do not improve, worsen or new problems develop. The patient verbalized understanding. Will notify patient with laboratory results.

## 2015-05-15 ENCOUNTER — Ambulatory Visit: Payer: Self-pay | Admitting: Family Medicine

## 2015-05-15 MED FILL — ATORVASTATIN 40 MG TABLET: 40 | 30 days supply | Qty: 30 | Fill #3

## 2015-05-15 MED FILL — AMLODIPINE BESYLATE 10 MG T: 10 | 30 days supply | Qty: 30 | Fill #0

## 2015-05-15 MED FILL — LISINOPRIL 10 MG TABLET: 10 | 30 days supply | Qty: 30 | Fill #0

## 2015-05-15 MED FILL — GABAPENTIN 300 MG CAPSULE: 300 | 30 days supply | Qty: 90 | Fill #0

## 2015-05-15 MED FILL — traMADol HCL 50 MG TABS: 50 | 10 days supply | Qty: 30 | Fill #0

## 2015-05-24 ENCOUNTER — Encounter: Payer: Self-pay | Admitting: Neurology

## 2015-05-24 ENCOUNTER — Ambulatory Visit (INDEPENDENT_AMBULATORY_CARE_PROVIDER_SITE_OTHER): Payer: Medicaid Other | Admitting: Neurology

## 2015-05-24 VITALS — BP 130/85 | HR 81 | Ht 66.0 in | Wt 167.6 lb

## 2015-05-24 DIAGNOSIS — I63512 Cerebral infarction due to unspecified occlusion or stenosis of left middle cerebral artery: Secondary | ICD-10-CM

## 2015-05-24 DIAGNOSIS — I10 Essential (primary) hypertension: Secondary | ICD-10-CM | POA: Insufficient documentation

## 2015-05-24 DIAGNOSIS — F1010 Alcohol abuse, uncomplicated: Secondary | ICD-10-CM

## 2015-05-24 DIAGNOSIS — F17200 Nicotine dependence, unspecified, uncomplicated: Secondary | ICD-10-CM | POA: Diagnosis not present

## 2015-05-24 NOTE — Patient Instructions (Addendum)
-   continue ASA and lipitor for stroke prevention - check BP at home twice a day and record and bring over to PCP - BP goal 130-150 due to left M1 occlusion - Follow up with your primary care physician for stroke risk factor modification. Recommend maintain blood pressure goal <130/80, diabetes with hemoglobin A1c goal below 6.5% and lipids with LDL cholesterol goal below 70 mg/dL.  - quit smoking - refer to PT/OT for balance and back pain - limit alcohol to 2 drinkbeers per day - follow up in 6 months.

## 2015-05-24 NOTE — Progress Notes (Signed)
STROKE NEUROLOGY FOLLOW UP NOTE  NAME: Kyrie Siwicki DOB: 05/31/1960  REASON FOR VISIT: stroke follow up HISTORY FROM: pt and wife and chart  Today we had the pleasure of seeing Rashee Gladbach in follow-up at our Neurology Clinic. Pt was accompanied by wife and daughter.   History Summary Mr. Darshawn Stewart is a 55 y.o. male with hx of heavy smoker, heavy drinker admitted on 01/18/15 for acute right sided weakness. MRI showed acute left PLIC infarct and chronic left CR infarct. MRA showed left M1 proximal cut off with diffuse athero. CUS and TTE unremarkable. LDL 133 and A1C 5.7. He was put on ASA 325 and lipitor as well as FA, B1 and multivitamin and recommended smoking cessation. Found to have HTN, put on low dose lisinopril. He was discharged in good condition.  02/16/15 follow up - the patient has been doing better. Still has subtle right sided weakness but largely recovered. BP still high today 168/103 and he did not check BP at home. On lisinopril 2.5mg  now but will  Need to increase the dose. He has no insurance now and is working on the financial support with [employer], [employer]. He has PCP first visit next week. Not quit smoking yet.   Interval History During the interval time, the patient has been doing stable from stroke standpoint. He still has right-sided numbness, but has increased LBP for the last 2 weeks which caused him left lower extremity numbness and mild walking difficulty. He also complains left lower extremity muscle cramping. PCP prescribed tramadol. Wife also complains more sedentary lifestyle at home, the patient not sure whether he feels depressed. BP today 130/85. He continues heavy drinking and smoking.  REVIEW OF SYSTEMS: Full 14 system review of systems performed and notable only for those listed below and in HPI above, all others are negative:  Constitutional:  Activity change, appetite change, fatigue Cardiovascular: Leg swelling Ear/Nose/Throat:  ringing years Skin:    Eyes:   Respiratory:   Gastroitestinal:   Genitourinary:  Hematology/Lymphatic:   Endocrine:  Musculoskeletal:  Walking difficulty Allergy/Immunology:   Neurological:  Numbness, weakness Psychiatric:  Sleep: Restless leg, insomnia, frequent waking  The following represents the patient's updated allergies and side effects list: No Known Allergies  The neurologically relevant items on the patient's problem list were reviewed on today's visit.  Neurologic Examination  A problem focused neurological exam (12 or more points of the single system neurologic examination, vital signs counts as 1 point, cranial nerves count for 8 points) was performed.  Blood pressure 130/85, pulse 81, height 5\' 6"  (1.676 m), weight 167 lb 9.6 oz (76.023 kg).  General - Well nourished, well developed, in no apparent distress.  Ophthalmologic - Sharp disc margins OU.   Cardiovascular - Regular rate and rhythm with no murmur.  Mental Status -  Level of arousal and orientation to place, and person were intact, but not orientated to time. Language including expression, naming, repetition, comprehension was assessed and found intact. Difficulty with spelling forward or backward, but able to calculate, however psychomotor slowing 3/3 registration and 2.5/3 delayed recall. Fund of Knowledge was assessed and was intact.  Cranial Nerves II - XII - II - Visual field intact OU. III, IV, VI - Extraocular movements intact. V - Facial sensation intact bilaterally. VII - Facial movement intact bilaterally. VIII - Hearing & vestibular intact bilaterally. X - Palate elevates symmetrically. XI - Chin turning & shoulder shrug intact bilaterally. XII - Tongue protrusion intact.  Motor Strength -  The patient's strength was normal in all extremities and pronator drift was absent.  Bulk was normal and fasciculations were absent.   Motor Tone - Muscle tone was assessed at the neck and appendages and was  normal.  Reflexes - The patient's reflexes were 1+ in all extremities and he had no pathological reflexes.  Sensory - Light touch, temperature/pinprick were assessed and were decreased on the right, 70% of left. Romberg testing showed teetering but no fall  Coordination - The patient had normal movements in the hands with no ataxia or dysmetria.  Tremor was absent.  Gait and Station - mild antalgic gait on the left.  Data reviewed: I personally reviewed the images and agree with the radiology interpretations.  Dg Chest 2 View 01/17/2015 No active cardiopulmonary disease.   Ct Head Wo Contrast 01/16/2015 1. No acute intracranial abnormality. 2. Cerebral/cerebellar atrophy and small vessel ischemic change.   Mri & Mra Head/brain Wo Cm 01/17/2015 Acute LEFT MCA lenticulostriate territory infarct without hemorrhage. LEFT M1 MCA large vessel occlusion. No significant recanalization of the LEFT M2 or M3 branches. Widespread intracranial atherosclerotic change as described, with potentially flow reducing lesions affecting the distal RIGHT MCA, proximal RIGHT ACA, distal RIGHT PCA, and tandem non stenotic lesions affecting the LEFT vertebral and proximal basilar. 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 Consider further assessment with CTA head due to motion degraded series.   Carotid Doppler  There is 1-39% bilateral ICA stenosis. Vertebral artery flow is antegrade.   2D Echocardiogram  - Left ventricle: The cavity size was normal. There was moderatefocal basal and mild concentric hypertrophy. Systolic functionwas normal. The estimated ejection fraction was in the range of55% to 60%. Wall motion was normal; there were no regional wallmotion abnormalities. - Atrial septum: There was increased thickness of the septum,consistent with lipomatous hypertrophy.  Component     Latest Ref Rng 01/16/2015 01/17/2015  Opiates     NONE DETECTED NONE DETECTED   COCAINE      NONE DETECTED NONE DETECTED   Benzodiazepines     NONE DETECTED NONE DETECTED   Amphetamines     NONE DETECTED NONE DETECTED   Tetrahydrocannabinol     NONE DETECTED POSITIVE (A)   Barbiturates     NONE DETECTED NONE DETECTED   Cholesterol     0 - 200 mg/dL  195  Triglycerides     <150 mg/dL  72  HDL Cholesterol     >40 mg/dL  48  Total CHOL/HDL Ratio       4.1  VLDL     0 - 40 mg/dL  14  LDL (calc)     0 - 99 mg/dL  133 (H)  Hemoglobin A1C     4.8 - 5.6 %  5.7 (H)  Mean Plasma Glucose       117  Alcohol, Ethyl (B)     <5 mg/dL 45 (H)     Assessment: As you may recall, he is a 55 y.o. Caucasian male with PMH of heavy smoker and heavy drinker was admitted for left PLIC infarct but also has chronic left CR infarct on MRI. MRA showed left M1 cut off. Found to have HTN also. CUS, TTE, A1C not remarkable. LDL 133. He was put on ASA 325 and lipitor and discharged to CIR. During the interval time, he improved well, now almost recovered fully. BP initially not in good control. Increase lisinopril to 10mg  and PCP add amlodipine. Now BP better controlled. He continues  heavy smoking and drinking, not quit yet. Pt had LBP flare, on tramadol, will refer to PT. Encourage more activity at home. Pt may be depressed but he would like to hold off SSRI for now. He will let us know if he decided on medication.  Plan:  - continue ASA and lipitor for stroke prevention - check BP at home and record and bring over to PCP - BP goal 130-150 due to left M1 occlusion - Follow up with your primary care physician for stroke risk factor modification. Recommend maintain blood pressure goal <130/80, diabetes with hemoglobin A1c goal below 6.5% and lipids with LDL cholesterol goal below 70 mg/dL.  - quit smoking - refer to PT/OT for balance and back pain - limit alcohol to 2 drinkbeers per day - follow up in 6 months.  I spent more than 25 minutes of face to face time with the patient. Greater than 50% of  time was spent in counseling and coordination of care. We have discussed about observation for depression, encouraging more activity at home, smoking cessation and limiting alcohol consumption as well as PT/OT referral .   Orders Placed This Encounter  Procedures  . Ambulatory referral to Physical Therapy    Referral Priority:  Routine    Referral Type:  Physical Medicine    Referral Reason:  Specialty Services Required    Requested Specialty:  Physical Therapy    Number of Visits Requested:  1  . Ambulatory referral to Occupational Therapy    Referral Priority:  Routine    Referral Type:  Occupational Therapy    Referral Reason:  Specialty Services Required    Requested Specialty:  Occupational Therapy    Number of Visits Requested:  1    No orders of the defined types were placed in this encounter.    Patient Instructions  - continue ASA and lipitor for stroke prevention - check BP at home twice a day and record and bring over to PCP - BP goal 130-150 due to left M1 occlusion - Follow up with your primary care physician for stroke risk factor modification. Recommend maintain blood pressure goal <130/80, diabetes with hemoglobin A1c goal below 6.5% and lipids with LDL cholesterol goal below 70 mg/dL.  - quit smoking - refer to PT/OT for balance and back pain - limit alcohol to 2 drinkbeers per day - follow up in 6 months.    Rosalin Hawking, MD PhD [facility] [address], [place], [place] [address] [phone]

## 2015-06-14 ENCOUNTER — Other Ambulatory Visit: Payer: Self-pay | Admitting: Family Medicine

## 2015-06-14 MED FILL — AMLODIPINE BESYLATE 10 MG T: 10 | 30 days supply | Qty: 30 | Fill #1

## 2015-06-14 MED FILL — LISINOPRIL 10 MG TABLET: 10 | 30 days supply | Qty: 30 | Fill #2 | Status: TO

## 2015-06-14 MED FILL — LISINOPRIL 10 MG TABLET: 10 | 30 days supply | Qty: 30 | Fill #1

## 2015-06-14 MED FILL — ATORVASTATIN 40 MG TABLET: 40 | 30 days supply | Qty: 30 | Fill #0 | Status: TO

## 2015-07-17 MED FILL — AMLODIPINE BESYLATE 10 MG T: 10 | 30 days supply | Qty: 30 | Fill #2

## 2015-07-17 MED FILL — ATORVASTATIN 40 MG TABLET: 40 | 30 days supply | Qty: 30 | Fill #1 | Status: TO

## 2015-07-17 MED FILL — ?LISINOPRIL 10 MG TABLET: 10 | 30 days supply | Qty: 30 | Fill #3 | Status: TO

## 2015-08-15 ENCOUNTER — Ambulatory Visit: Payer: Medicaid Other | Admitting: Family Medicine

## 2015-08-17 ENCOUNTER — Telehealth: Payer: Self-pay

## 2015-08-17 MED FILL — ?LISINOPRIL 10 MG TABLET: 10 | 30 days supply | Qty: 30 | Fill #4 | Status: TO

## 2015-08-17 MED FILL — AMLODIPINE BESYLATE 10 MG T: 10 | 30 days supply | Qty: 30 | Fill #3 | Status: TO

## 2015-08-17 MED FILL — ATORVASTATIN 40 MG TABLET: 40 | 30 days supply | Qty: 30 | Fill #2 | Status: TO

## 2015-08-17 NOTE — Telephone Encounter (Signed)
IF PATIENT CALLS BACK OR HIS FRIEND, HE NEEDS TO CALL [phone] TO SCHEDULE REHAB APPT AT [facility] NEXT DOOR.   LFT vm for patient that he needs to call neuro rehab to schedule an appt.PT needs to call [phone] which is neuro rehab next door.. Rn was sent a message from Dr .Erlinda Hong and Jannifer Hick from rehab that multiple attempts have been made.Pt has not return phone call to schedule rehab.

## 2015-08-18 ENCOUNTER — Ambulatory Visit (INDEPENDENT_AMBULATORY_CARE_PROVIDER_SITE_OTHER): Payer: Medicaid Other | Admitting: Family Medicine

## 2015-08-18 VITALS — BP 114/70 | HR 89 | Temp 98.6°F | Resp 14 | Ht 66.0 in | Wt 170.0 lb

## 2015-08-18 DIAGNOSIS — M5441 Lumbago with sciatica, right side: Secondary | ICD-10-CM

## 2015-08-18 DIAGNOSIS — M5442 Lumbago with sciatica, left side: Secondary | ICD-10-CM

## 2015-08-18 DIAGNOSIS — Z125 Encounter for screening for malignant neoplasm of prostate: Secondary | ICD-10-CM

## 2015-08-18 DIAGNOSIS — H547 Unspecified visual loss: Secondary | ICD-10-CM

## 2015-08-18 DIAGNOSIS — G8929 Other chronic pain: Secondary | ICD-10-CM

## 2015-08-18 DIAGNOSIS — Z1159 Encounter for screening for other viral diseases: Secondary | ICD-10-CM

## 2015-08-18 DIAGNOSIS — Z114 Encounter for screening for human immunodeficiency virus [HIV]: Secondary | ICD-10-CM

## 2015-08-18 DIAGNOSIS — I10 Essential (primary) hypertension: Secondary | ICD-10-CM | POA: Diagnosis not present

## 2015-08-18 LAB — CBC WITH DIFFERENTIAL/PLATELET
BASOS PCT: 1 %
Basophils Absolute: 97 cells/uL (ref 0–200)
EOS PCT: 2 %
Eosinophils Absolute: 194 cells/uL (ref 15–500)
HCT: 42.4 % (ref 38.5–50.0)
Hemoglobin: 14 g/dL (ref 13.2–17.1)
Lymphocytes Relative: 31 %
Lymphs Abs: 3007 cells/uL (ref 850–3900)
MCH: 29.7 pg (ref 27.0–33.0)
MCHC: 33 g/dL (ref 32.0–36.0)
MCV: 89.8 fL (ref 80.0–100.0)
MONOS PCT: 10 %
MPV: 9.5 fL (ref 7.5–12.5)
Monocytes Absolute: 970 cells/uL — ABNORMAL HIGH (ref 200–950)
NEUTROS ABS: 5432 {cells}/uL (ref 1500–7800)
Neutrophils Relative %: 56 %
PLATELETS: 312 10*3/uL (ref 140–400)
RBC: 4.72 MIL/uL (ref 4.20–5.80)
RDW: 14 % (ref 11.0–15.0)
WBC: 9.7 10*3/uL (ref 3.8–10.8)

## 2015-08-18 MED ORDER — AMLODIPINE BESYLATE 10 MG PO TABS: 10.0000 mg | ORAL_TABLET | Freq: Every day | ORAL | Status: DC

## 2015-08-18 MED ORDER — TRAMADOL HCL 50 MG PO TABS: 50.0000 mg | ORAL_TABLET | Freq: Three times a day (TID) | ORAL | Status: DC | PRN

## 2015-08-18 MED ORDER — ATORVASTATIN CALCIUM 40 MG PO TABS
40.0000 mg | ORAL_TABLET | ORAL | Status: DC
Start: 2015-08-18 — End: 2016-03-12

## 2015-08-18 MED ORDER — DULOXETINE HCL 30 MG PO CPEP: 30.0000 mg | ORAL_CAPSULE | Freq: Every day | ORAL | Status: DC

## 2015-08-18 MED ORDER — LISINOPRIL 10 MG PO TABS: 10.0000 mg | ORAL_TABLET | Freq: Every day | ORAL | Status: DC

## 2015-08-18 NOTE — Patient Instructions (Signed)
Take cymbalta one daily. Use Tramadol sparingly Will put in referral to eye doctor Consider going back to physical therapy.

## 2015-08-19 LAB — COMPLETE METABOLIC PANEL WITH GFR
ALK PHOS: 57 U/L (ref 40–115)
ALT: 25 U/L (ref 9–46)
AST: 26 U/L (ref 10–35)
Albumin: 4.3 g/dL (ref 3.6–5.1)
BILIRUBIN TOTAL: 0.4 mg/dL (ref 0.2–1.2)
BUN: 8 mg/dL (ref 7–25)
CO2: 25 mmol/L (ref 20–31)
CREATININE: 1 mg/dL (ref 0.70–1.33)
Calcium: 9.1 mg/dL (ref 8.6–10.3)
Chloride: 109 mmol/L (ref 98–110)
GFR, Est African American: >89 mL/min (ref >=60)
GFR, Est Non African American: 84 mL/min (ref >=60)
GLUCOSE: 83 mg/dL (ref 65–99)
Potassium: 4.4 mmol/L (ref 3.5–5.3)
SODIUM: 141 mmol/L (ref 135–146)
TOTAL PROTEIN: 6.4 g/dL (ref 6.1–8.1)

## 2015-08-19 LAB — HIV ANTIBODY (ROUTINE TESTING W REFLEX): HIV 1&2 Ab, 4th Generation: NONREACTIVE

## 2015-08-19 LAB — PSA: PSA: 1.66 ng/mL (ref <=4.00)

## 2015-08-19 LAB — HEPATITIS C ANTIBODY: HCV Ab: NEGATIVE

## 2015-08-21 NOTE — Progress Notes (Signed)
Patient ID: Michael Davenport, male   DOB: 1960-03-28, 55 y.o.   MRN: [id number]   Michael Davenport, is a 55 y.o. male  [id number]  [id number]  DOB - 1960-07-22  CC:  Chief Complaint  Patient presents with  . Hypertension  . Leg Pain    bilateral pain in legs         HPI: Michael Davenport is a 55 y.o. male .Michael Davenport, a 55 year old male with a history of uncontrolled hypertension, alcohol abuse and stroke presents for a  follow up of hypertension and chronic back pain. Mr. Both is accompanied by his wife. He is taking antihypertensive medications consistently and following a low sodium diet.He is currently not checking blood pressures at home. Patient denies headache, dizziness, chest pain, dyspnea, fatigue, irregular heart beat, lower extremity edema, near-syncope, palpitations, syncope and tachypnea. Cardiovascular risk factors include: dyslipidemia, hypertension, male gender, sedentary lifestyle and smoking/ tobacco exposure. He main complaint today is of back and left leg pain. He has some weakness in right leg as result of recent CVA. He does follow-up with neurologist. He has had PT ordered but has not followed up on that. He was apparently offend by something at that office.He is needing refills of some medications.  He does smoke cigarettes and some marijuana.   Health maintenance:  He is in need of HIV, Hep C and prostate cancer screening  No Known Allergies Past Medical History  Diagnosis Date  . HTN (hypertension)   . ETOH abuse 01/23/2015  . Stroke [facility])    Current Outpatient Prescriptions on File Prior to Visit  Medication Sig Dispense Refill  . aspirin 325 MG tablet Take 1 tablet (325 mg total) by mouth daily. Available over the counter--use coated Aspirin [id number] tablet 0  . folic acid (FOLVITE) 1 MG tablet Take 1 tablet (1 mg total) by mouth daily. 30 tablet 0  . Multiple Vitamin (MULTIVITAMIN WITH MINERALS) TABS tablet Take 1 tablet by mouth daily. Can get store brand.  Available over the counter 100 tablet 0  . gabapentin (NEURONTIN) 300 MG capsule Take 1 capsule (300 mg total) by mouth 3 (three) times daily. (Patient not taking: Reported on 08/18/2015) 90 capsule 1  . thiamine 100 MG tablet Take 1 tablet (100 mg total) by mouth daily. (Patient not taking: Reported on 08/18/2015) 100 tablet 0   No current facility-administered medications on file prior to visit.   Family History  Problem Relation Age of Onset  . Hypertension Other   . Hypertension Brother   . Heart attack Brother   . Heart attack Father   . Heart disease Father   . Diabetes Sister   . Hyperlipidemia Sister   . Heart attack Sister    Social History   Social History  . Marital Status: Single    Spouse Name: N/A  . Number of Children: N/A  . Years of Education: N/A   Occupational History  . Not on file.   Social History Main Topics  . Smoking status: Current Every Day Smoker -- 0.25 packs/day    Types: Cigarettes  . Smokeless tobacco: Former Systems developer    Quit date: 01/15/2015  . Alcohol Use: 14.4 oz/week    24 Cans of beer per week     Comment: 3-6 beers a day  . Drug Use: No  . Sexual Activity: Not on file   Other Topics Concern  . Not on file   Social History Narrative    Review of Systems: Constitutional:  Negative for fever, chills, appetite change, weight loss,  Fatigue. Skin: Negative for rashes or lesions of concern. HENT: Negative for ear pain, ear discharge.nose bleeds Eyes: Negative for pain, discharge, redness, itching and visual disturbance. Neck: Negative for pain, stiffness Respiratory: Negative for cough, shortness of breath,   Cardiovascular: Negative for chest pain, palpitations and leg swelling. Gastrointestinal: Negative for abdominal pain, nausea, vomiting, diarrhea, constipations Genitourinary: Negative for dysuria, urgency, frequency, hematuria,  Musculoskeletal: Positive for lower back and left leg pain Neurological: Negative for dizziness,  tremors, seizures, syncope,   light-headedness, numbness and headaches. Positive for weakness of left leg. Hematological: Negative for easy bruising or bleeding Psychiatric/Behavioral: Negative for depression, anxiety, decreased concentration, confusion   Objective:   Filed Vitals:   08/18/15 1537  BP: 114/70  Pulse: 89  Temp: 98.6 F (37 C)  Resp: 14    Physical Exam: Constitutional: Patient appears well-developed and well-nourished. No distress. HENT: Normocephalic, atraumatic, External right and left ear normal. Oropharynx is clear and moist.  Eyes: Conjunctivae and EOM are normal. PERRLA, no scleral icterus. Neck: Normal ROM. Neck supple. No lymphadenopathy, No thyromegaly. CVS: RRR, S1/S2 +, no murmurs, no gallops, no rubs Pulmonary: Effort and breath sounds normal, no stridor, rhonchi, wheezes, rales.  Abdominal: Soft. Normoactive BS,, no distension, tenderness, rebound or guarding.  Musculoskeletal: Normal range of motion. No edema and no tenderness. Weakness of right leg present Neuro: Alert.Normal muscle tone coordination. Non-focal Skin: Skin is warm and dry. No rash noted. Not diaphoretic. No erythema. No pallor. Psychiatric: Normal mood and affect. Behavior, judgment, thought content normal.  Lab Results  Component Value Date   WBC 9.7 08/18/2015   HGB 14.0 08/18/2015   HCT 42.4 08/18/2015   MCV 89.8 08/18/2015   PLT 312 08/18/2015   Lab Results  Component Value Date   CREATININE 1.00 08/18/2015   BUN 8 08/18/2015   NA 141 08/18/2015   K 4.4 08/18/2015   CL 109 08/18/2015   CO2 25 08/18/2015    Lab Results  Component Value Date   HGBA1C 5.7* 01/17/2015   Lipid Panel     Component Value Date/Time   CHOL 195 01/17/2015 0750   TRIG 72 01/17/2015 0750   HDL 48 01/17/2015 0750   CHOLHDL 4.1 01/17/2015 0750   VLDL 14 01/17/2015 0750   LDLCALC 133* 01/17/2015 0750       Assessment and plan:   1. Essential hypertension  - CBC with Differential -  COMPLETE METABOLIC PANEL WITH GFR - lisinopril (PRINIVIL,ZESTRIL) 10 MG tablet; Take 1 tablet (10 mg total) by mouth daily with supper.  Dispense: 90 tablet; Refill: 1 - amLODipine (NORVASC) 10 MG tablet; Take 1 tablet (10 mg total) by mouth daily.  Dispense: 90 tablet; Refill: 1  2. Screening for HIV (human immunodeficiency virus)  - HIV antibody (with reflex)  3. Need for hepatitis C screening test  - Hepatitis C Antibody  4. Screening for prostate cancer  - PSA  5. Chronic bilateral low back pain with bilateral sciatica -Trial of Cymbalta 30 mg, #30, one po q day. - traMADol (ULTRAM) 50 MG tablet; Take 1 tablet (50 mg total) by mouth every 8 (eight) hours as needed.  Dispense: 30 tablet; Refill: 0  6. Decreased vision  - Ambulatory referral to Ophthalmology   Return in about 3 months (around 11/18/2015) for HTN.  The patient was given clear instructions to go to ER or return to medical center if symptoms don't improve, worsen or new problems  develop. The patient verbalized understanding.    Micheline Chapman FNP  08/21/2015, 7:38 AM

## 2015-11-23 ENCOUNTER — Ambulatory Visit: Payer: Medicaid Other | Admitting: Neurology

## 2015-11-24 ENCOUNTER — Ambulatory Visit: Payer: Medicaid Other | Admitting: Family Medicine

## 2015-11-27 ENCOUNTER — Ambulatory Visit (INDEPENDENT_AMBULATORY_CARE_PROVIDER_SITE_OTHER): Payer: Medicaid Other | Admitting: Family Medicine

## 2015-11-27 ENCOUNTER — Encounter: Payer: Self-pay | Admitting: Gastroenterology

## 2015-11-27 ENCOUNTER — Encounter: Payer: Self-pay | Admitting: Family Medicine

## 2015-11-27 VITALS — BP 127/79 | HR 87 | Temp 98.1°F | Resp 14 | Ht 66.0 in | Wt 170.0 lb

## 2015-11-27 DIAGNOSIS — Z125 Encounter for screening for malignant neoplasm of prostate: Secondary | ICD-10-CM

## 2015-11-27 DIAGNOSIS — Z1211 Encounter for screening for malignant neoplasm of colon: Secondary | ICD-10-CM

## 2015-11-27 DIAGNOSIS — I10 Essential (primary) hypertension: Secondary | ICD-10-CM

## 2015-11-27 DIAGNOSIS — Z23 Encounter for immunization: Secondary | ICD-10-CM | POA: Diagnosis not present

## 2015-11-27 MED ORDER — TRAZODONE HCL 50 MG PO TABS: 50.0000 mg | ORAL_TABLET | Freq: Every evening | ORAL | 3 refills | Status: DC | PRN

## 2015-11-27 NOTE — Progress Notes (Signed)
Michael Davenport, is a 55 y.o. male  [id number]  [id number]  DOB - February 27, 1960  CC:  Chief Complaint  Patient presents with  . follow up    would like flu vaccine, having foot problems from stroke, has pain with shoes on, feels better with bare feet, especially the right foot, would like to know if he should use 81mg  ASA or 325mg ?        HPI: Michael Davenport is a 55 y.o. male here for follow-up hypertension. He is s/p CVA in Dec 2016. He has an appointment with neurologist in January.  His only complaint today is of foot pain. With much questioning, I determine that this is not a nerve type pain. There is no burning, tingling or numbness. The pain is only there when he wears shoes. The pain is in the entire sole of his right root. It does not hurt when he walks if he is wearing bedroom slippers. He does report that he walks with a abnormal gait. He did not complete PT after is stroke. He has a history of alcohol abuse but reports he is drinking very little currently. She does continue to smoke cigarettes.    Health Maintenance: He is to receive both a Tdap and a flu shot today. Will also order a PSA and colonoscopy.  No Known Allergies Past Medical History:  Diagnosis Date  . ETOH abuse 01/23/2015  . HTN (hypertension)   . Stroke [facility])    Current Outpatient Prescriptions on File Prior to Visit  Medication Sig Dispense Refill  . amLODipine (NORVASC) 10 MG tablet Take 1 tablet (10 mg total) by mouth daily. 90 tablet 1  . aspirin 325 MG tablet Take 1 tablet (325 mg total) by mouth daily. Available over the counter--use coated Aspirin 100 tablet 0  . atorvastatin (LIPITOR) 40 MG tablet Take 1 tablet (40 mg total) by mouth 1 day or 1 dose. 90 tablet 1  . folic acid (FOLVITE) 1 MG tablet Take 1 tablet (1 mg total) by mouth daily. 30 tablet 0  . lisinopril (PRINIVIL,ZESTRIL) 10 MG tablet Take 1 tablet (10 mg total) by mouth daily with supper. 90 tablet 1  . Multiple Vitamin (MULTIVITAMIN  WITH MINERALS) TABS tablet Take 1 tablet by mouth daily. Can get store brand. Available over the counter 100 tablet 0  . thiamine 100 MG tablet Take 1 tablet (100 mg total) by mouth daily. (Patient not taking: Reported on 11/27/2015) 100 tablet 0   No current facility-administered medications on file prior to visit.    Family History  Problem Relation Age of Onset  . Hypertension Brother   . Heart attack Brother   . Heart attack Father   . Heart disease Father   . Diabetes Sister   . Hyperlipidemia Sister   . Heart attack Sister   . Cancer Sister   . Hypertension Other    Social History   Social History  . Marital status: Single    Spouse name: N/A  . Number of children: N/A  . Years of education: N/A   Occupational History  . Not on file.   Social History Main Topics  . Smoking status: Current Every Day Smoker    Packs/day: 0.25    Types: Cigarettes  . Smokeless tobacco: Former Systems developer    Quit date: 01/15/2015  . Alcohol use 14.4 oz/week    24 Cans of beer per week     Comment: occas drinking  . Drug use: No  .  Sexual activity: Not on file   Other Topics Concern  . Not on file   Social History Narrative  . No narrative on file    Review of Systems: Constitutional: + for fatigue Skin: Negative HENT: Negative  Eyes: Negative. Has appointment with eye doctor coming up. Neck: Negative Respiratory: Negative Cardiovascular: Negative Gastrointestinal: Negative Genitourinary: Negative  Musculoskeletal: + for pain in the bottom of his right foot. Also low back pain Neurological: + for abnormal gait.  Hematological: Negative  Psychiatric/Behavioral: Negative.+ for insomnia   Objective:   Vitals:   11/27/15 0825  BP: 127/79  Pulse: 87  Resp: 14  Temp: 98.1 F (36.7 C)    Physical Exam: Constitutional: Patient appears well-developed and well-nourished. No distress. HENT: Normocephalic, atraumatic, External right and left ear normal. Oropharynx is clear  and moist.  Eyes: Conjunctivae and EOM are normal. PERRLA, no scleral icterus. Neck: Normal ROM. Neck supple. No lymphadenopathy, No thyromegaly. CVS: RRR, S1/S2 +, no murmurs, no gallops, no rubs Pulmonary: Effort and breath sounds normal, no stridor, rhonchi, wheezes, rales.  Abdominal: Soft. Normoactive BS,, no distension, tenderness, rebound or guarding.  Musculoskeletal: Normal range of motion. No edema and no tenderness.  Neuro: Alert.Normal muscle tone coordination. Non-focal. Walk with an abnormal gait Skin: Skin is warm and dry. No rash noted. Not diaphoretic. No erythema. No pallor. Psychiatric: Normal mood and affect. Behavior, judgment, thought content normal.  Lab Results  Component Value Date   WBC 9.7 08/18/2015   HGB 14.0 08/18/2015   HCT 42.4 08/18/2015   MCV 89.8 08/18/2015   PLT 312 08/18/2015   Lab Results  Component Value Date   CREATININE 1.00 08/18/2015   BUN 8 08/18/2015   NA 141 08/18/2015   K 4.4 08/18/2015   CL 109 08/18/2015   CO2 25 08/18/2015    Lab Results  Component Value Date   HGBA1C 5.7 (H) 01/17/2015   Lipid Panel     Component Value Date/Time   CHOL 195 01/17/2015 0750   TRIG 72 01/17/2015 0750   HDL 48 01/17/2015 0750   CHOLHDL 4.1 01/17/2015 0750   VLDL 14 01/17/2015 0750   LDLCALC 133 (H) 01/17/2015 0750       Assessment and plan:   1. Essential hypertension  - Hemoglobin A1c - Lipid panel  2. Screening for prostate cancer -PSA  3. Need for prophylactic vaccination and inoculation against influenza  - Flu Vaccine QUAD 36+ mos PF IM (Fluarix & Fluzone Quad PF)  4. Need for Tdap vaccination  - Tdap vaccine greater than or equal to 7yo IM  5. Colon cancer screening  - Ambulatory referral to Gastroenterology  6. Foot pain - Discuss with neurologist at next visit.   Return in about 6 months (around 05/27/2016).  The patient was given clear instructions to go to ER or return to medical center if symptoms don't  improve, worsen or new problems develop. The patient verbalized understanding.    Micheline Chapman FNP  11/27/2015, 10:52 AM

## 2015-11-28 LAB — LIPID PANEL
CHOL/HDL RATIO: 2.4 ratio (ref <=5.0)
Cholesterol: 126 mg/dL (ref 125–200)
HDL: 53 mg/dL (ref >=40)
LDL Cholesterol: 60 mg/dL (ref <130)
TRIGLYCERIDES: 65 mg/dL (ref <150)
VLDL: 13 mg/dL (ref <30)

## 2015-11-28 LAB — HEMOGLOBIN A1C
Hgb A1c MFr Bld: 5.1 % (ref <5.7)
MEAN PLASMA GLUCOSE: 100 mg/dL

## 2015-12-25 ENCOUNTER — Encounter: Payer: Self-pay | Admitting: Occupational Therapy

## 2015-12-25 DIAGNOSIS — IMO0002 Reserved for concepts with insufficient information to code with codable children: Secondary | ICD-10-CM

## 2015-12-25 NOTE — Therapy (Signed)
[facility] [address] [place], [place], [address] Phone: [phone]   Fax:  [phone]  Occupational Therapy Treatment  Patient Details  Name: Michael Davenport MRN: [id number] Date of Birth: 07/20/1960 Referring Provider: Dr. Alysia Penna  Encounter Date: 12/25/2015    Past Medical History:  Diagnosis Date  . ETOH abuse 01/23/2015  . HTN (hypertension)   . Stroke [facility])     No past surgical history on file.  There were no vitals filed for this visit.                                 OT Long Term Goals - 03/13/15 1804      OT LONG TERM GOAL #1   Title Pt will be mod I with HEP - 04/17/2015 (pt to return in 2 weeks to hopefully allow for Medicaid approval)   Baseline dependent   Status New     OT LONG TERM GOAL #2   Title Pt will be mod I for cutting food on plate using R hand as dominant   Baseline dependent   Status New     OT LONG TERM GOAL #3   Title Pt will demonstrate ability to complete fine motor tasks with minimal difficulty   Baseline maximal difficulty   Status New     OT LONG TERM GOAL #4   Title Pt will demonstrate ability to use vision mod I  to compensate for sensory impairment in R dominant hand   Baseline dependent   Status New     OT LONG TERM GOAL #5   Title Pt will be mod I for hot meal prep at ambulatory level   Baseline dependent - only able to fix cold snack and heat up prepared food in microwave   Status New     Long Term Additional Goals   Additional Long Term Goals Yes     OT LONG TERM GOAL #6   Title Pt will demonstrate adequate balance to complete home management task mod I   Baseline dependent   Status New             Patient will benefit from skilled therapeutic intervention in order to improve the following deficits and impairments:     Visit Diagnosis: Lack of coordination due to stroke [facility])    Problem List Patient  Active Problem List   Diagnosis Date Noted  . Essential hypertension 05/24/2015  . Chronic bilateral low back pain with bilateral sciatica 04/10/2015  . Status post stroke due to cerebrovascular disease 02/23/2015  . Gait disturbance, post-stroke 02/17/2015  . Cerebrovascular accident (CVA) due to occlusion of left middle cerebral artery ([place]) 02/16/2015  . Tetrahydrocannabinol (THC) use disorder, mild, abuse 02/16/2015  . ETOH abuse 01/23/2015  . Tobacco use disorder 01/23/2015  . Stroke due to embolism of right middle cerebral artery ([place]) 01/19/2015  . HTN (hypertension) 01/18/2015  . TIA (transient ischemic attack) 01/17/2015  . Alcohol abuse 01/17/2015  . Tobacco abuse 01/17/2015  . Elevated BP 01/17/2015  . Acute CVA (cerebrovascular accident) ([place]) 01/17/2015  . Dyslipidemia    Pt attended eval however did not return for any treatment visits.  Will discharge from OT at this time.  Quay Burow, OTR/L 12/25/2015, 3:29 PM  [facility] [address] [place], [place], [address] Phone: [phone]   Fax:  [phone]  Name: Michael Davenport  MRN: [id number] Date of Birth: July 29, 1960

## 2016-02-19 ENCOUNTER — Ambulatory Visit (AMBULATORY_SURGERY_CENTER): Payer: Self-pay | Admitting: *Deleted

## 2016-02-19 VITALS — Ht 66.0 in | Wt 168.4 lb

## 2016-02-19 DIAGNOSIS — Z1211 Encounter for screening for malignant neoplasm of colon: Secondary | ICD-10-CM

## 2016-02-19 MED ORDER — NA SULFATE-K SULFATE-MG SULF 17.5-3.13-1.6 GM/177ML PO SOLN: 1.0000 | Freq: Once | ORAL | 0 refills | Status: AC

## 2016-02-19 NOTE — Progress Notes (Signed)
PV done by A Lewellyn

## 2016-02-23 ENCOUNTER — Telehealth: Payer: Self-pay | Admitting: Gastroenterology

## 2016-02-23 NOTE — Telephone Encounter (Signed)
Spoke to patient, from what he describes he has a ventral hernia above his umbilicus. Patient reassured that he is okay to proceed with colonoscopy. Patient had numerous questions on prep, spent time verbally going over instructions with him. Patient admits that his reading skills are poor.  I instructed patient to let Dr. Havery Moros know of his hernia. Patient verbalizes his understanding, will see him on 1/29 for his colonoscopy.

## 2016-02-23 NOTE — Telephone Encounter (Signed)
Thank you, agree with recommendations.

## 2016-02-26 ENCOUNTER — Encounter: Payer: Self-pay | Admitting: Neurology

## 2016-02-26 ENCOUNTER — Ambulatory Visit (INDEPENDENT_AMBULATORY_CARE_PROVIDER_SITE_OTHER): Payer: Medicaid Other | Admitting: Neurology

## 2016-02-26 VITALS — BP 145/81 | HR 103 | Ht 66.0 in | Wt 167.4 lb

## 2016-02-26 DIAGNOSIS — F1010 Alcohol abuse, uncomplicated: Secondary | ICD-10-CM | POA: Diagnosis not present

## 2016-02-26 DIAGNOSIS — E785 Hyperlipidemia, unspecified: Secondary | ICD-10-CM

## 2016-02-26 DIAGNOSIS — Z720 Tobacco use: Secondary | ICD-10-CM

## 2016-02-26 DIAGNOSIS — I10 Essential (primary) hypertension: Secondary | ICD-10-CM

## 2016-02-26 DIAGNOSIS — I63512 Cerebral infarction due to unspecified occlusion or stenosis of left middle cerebral artery: Secondary | ICD-10-CM

## 2016-02-26 DIAGNOSIS — I69398 Other sequelae of cerebral infarction: Secondary | ICD-10-CM | POA: Diagnosis not present

## 2016-02-26 DIAGNOSIS — R269 Unspecified abnormalities of gait and mobility: Secondary | ICD-10-CM

## 2016-02-26 MED ORDER — ASPIRIN 325 MG PO TBEC: 325.0000 mg | DELAYED_RELEASE_TABLET | Freq: Every day | ORAL | Status: AC

## 2016-02-26 NOTE — Progress Notes (Signed)
STROKE NEUROLOGY FOLLOW UP NOTE  NAME: Michael Davenport DOB: [birth year]  REASON FOR VISIT: stroke follow up HISTORY FROM: pt and wife and chart  Today we had the pleasure of seeing Michael Davenport in follow-up at our Neurology Clinic. Pt was accompanied by wife and daughter.   History Summary Michael Davenport is a 56 y.o. male with hx of heavy smoker, heavy drinker admitted on 01/18/15 for acute right sided weakness. MRI showed acute left PLIC infarct and chronic left CR infarct. MRA showed left M1 proximal cut off with diffuse athero. CUS and TTE unremarkable. LDL 133 and A1C 5.7. He was put on ASA 325 and lipitor as well as FA, B1 and multivitamin and recommended smoking cessation. Found to have HTN, put on low dose lisinopril. He was discharged in good condition.  02/16/15 follow up - the patient has been doing better. Still has subtle right sided weakness but largely recovered. BP still high today 168/103 and he did not check BP at home. On lisinopril 2.5mg  now but will  Need to increase the dose. He has no insurance now and is working on the financial support with [employer], [employer]. He has PCP first visit next week. Not quit smoking yet.   05/24/15 follow up - the patient has been doing stable from stroke standpoint. He still has right-sided numbness, but has increased LBP for the last 2 weeks which caused him left lower extremity numbness and mild walking difficulty. He also complains left lower extremity muscle cramping. PCP prescribed tramadol. Wife also complains more sedentary lifestyle at home, the patient not sure whether he feels depressed. BP today 130/85. He continues heavy drinking and smoking.  Interval History During the interval time, patient has been doing the same. He still has mild right-sided weakness and hemiplegic gait with right sensory decreased. He did not have PT/OT due to insurance issue and did not the way he was treated in PT facility. He has not quit smoking yet and  complains of his wife continues smoking. He stated he has limited his drink for 2 drinks per day. BP today 145/81. He is on aspirin 81 instead of 325.  REVIEW OF SYSTEMS: Full 14 system review of systems performed and notable only for those listed below and in HPI above, all others are negative:  Constitutional:  Activity change, appetite change, fatigue Cardiovascular:  Ear/Nose/Throat:   Skin:  Eyes:   Respiratory:   Gastroitestinal:   Genitourinary:  Hematology/Lymphatic:   Endocrine:  Musculoskeletal:  Walking difficulty Allergy/Immunology:   Neurological:  Numbness Psychiatric:  Sleep:   The following represents the patient's updated allergies and side effects list: No Known Allergies  The neurologically relevant items on the patient's problem list were reviewed on today's visit.  Neurologic Examination  A problem focused neurological exam (12 or more points of the single system neurologic examination, vital signs counts as 1 point, cranial nerves count for 8 points) was performed.  Blood pressure (!) 145/81, pulse (!) 103, height 5\' 6"  (1.676 m), weight 167 lb 6.4 oz (75.9 kg).  General - Well nourished, well developed, in no apparent distress.  Ophthalmologic - Sharp disc margins OU.   Cardiovascular - Regular rate and rhythm with no murmur.  Mental Status -  Level of arousal and orientation to place, and person were intact, but not orientated to time. Language including expression, naming, repetition, comprehension was assessed and found intact. Difficulty with spelling forward or backward, but able to calculate 3/3 registration and 2/3 delayed  recall. Fund of Knowledge was assessed and was intact.  Cranial Nerves II - XII - II - Visual field intact OU. III, IV, VI - Extraocular movements intact. V - Facial sensation intact bilaterally. VII - Facial movement intact bilaterally. VIII - Hearing & vestibular intact bilaterally. X - Palate elevates  symmetrically. XI - Chin turning & shoulder shrug intact bilaterally. XII - Tongue protrusion intact.  Motor Strength - The patient's strength was normal in all extremities and pronator drift was absent.  Bulk was normal and fasciculations were absent.   Motor Tone - Muscle tone was assessed at the neck and appendages and was normal.  Reflexes - The patient's reflexes were 1+ in all extremities and he had no pathological reflexes.  Sensory - Light touch, temperature/pinprick were assessed and were decreased on the right, 70% of left. Romberg testing showed teetering but no fall  Coordination - The patient had normal movements in the hands with no ataxia or dysmetria.  Tremor was absent.  Gait and Station - mild antalgic gait on the left.  Data reviewed: I personally reviewed the images and agree with the radiology interpretations.  Dg Chest 2 View 01/17/2015 No active cardiopulmonary disease.   Ct Head Wo Contrast 01/16/2015 1. No acute intracranial abnormality. 2. Cerebral/cerebellar atrophy and small vessel ischemic change.   Mri & Mra Head/brain Wo Cm 01/17/2015 Acute LEFT MCA lenticulostriate territory infarct without hemorrhage. LEFT M1 MCA large vessel occlusion. No significant recanalization of the LEFT M2 or M3 branches. Widespread intracranial atherosclerotic change as described, with potentially flow reducing lesions affecting the distal RIGHT MCA, proximal RIGHT ACA, distal RIGHT PCA, and tandem non stenotic lesions affecting the LEFT vertebral and proximal basilar. 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 Consider further assessment with CTA head due to motion degraded series.   Carotid Doppler  There is 1-39% bilateral ICA stenosis. Vertebral artery flow is antegrade.   2D Echocardiogram  - Left ventricle: The cavity size was normal. There was moderatefocal basal and mild concentric hypertrophy. Systolic functionwas normal. The estimated ejection  fraction was in the range of55% to 60%. Wall motion was normal; there were no regional wallmotion abnormalities. - Atrial septum: There was increased thickness of the septum,consistent with lipomatous hypertrophy.  Component     Latest Ref Rng 01/16/2015 01/17/2015  Opiates     NONE DETECTED NONE DETECTED   COCAINE     NONE DETECTED NONE DETECTED   Benzodiazepines     NONE DETECTED NONE DETECTED   Amphetamines     NONE DETECTED NONE DETECTED   Tetrahydrocannabinol     NONE DETECTED POSITIVE (A)   Barbiturates     NONE DETECTED NONE DETECTED   Cholesterol     0 - 200 mg/dL  195  Triglycerides     <150 mg/dL  72  HDL Cholesterol     >40 mg/dL  48  Total CHOL/HDL Ratio       4.1  VLDL     0 - 40 mg/dL  14  LDL (calc)     0 - 99 mg/dL  133 (H)  Hemoglobin A1C     4.8 - 5.6 %  5.7 (H)  Mean Plasma Glucose       117  Alcohol, Ethyl (B)     <5 mg/dL 45 (H)     Assessment: As you may recall, he is a 56 y.o. Caucasian male with PMH of heavy smoker and heavy drinker was admitted on 01/2015 for  left PLIC infarct but also has chronic left CR infarct on MRI. MRA showed left M1 cut off. Found to have HTN also. CUS, TTE, A1C not remarkable. LDL 133. He was put on ASA 325 and lipitor and discharged to CIR. During the interval time, he improved well, now almost recovered fully. BP initially not in good control. Increase lisinopril to 10mg  and PCP add amlodipine. Now BP better controlled. He continues smoking and Limited drink to 2 drinks per day, not quit yet. BP better controlled. He has not get PT/OT yet and still has mild right hemiparetic gait and right decreased sensation. He is currently on aspirin 81.   Plan:  - continue lipitor for stroke prevention and increase ASA to 325 - check BP at home  - BP goal 130-150 due to left M1 occlusion - Follow up with your primary care physician for stroke risk factor modification. Recommend maintain blood pressure goal 130-150/70-90, diabetes  with hemoglobin A1c goal below 6.5% and lipids with LDL cholesterol goal below 70 mg/dL.  - quit smoking - refer to PT/OT for wlking difficulty near your home - limit alcohol to 2 beers per day - home exercise and healthy diet - follow up in 6 months with Cecille Rubin NP.  I spent more than 25 minutes of face to face time with the patient. Greater than 50% of time was spent in counseling and coordination of care. We have discussed about new PT/OT referral, BP monitoring at home, and quit smoking and Limited drinking.   No orders of the defined types were placed in this encounter.   No orders of the defined types were placed in this encounter.   Patient Instructions  - continue ASA and lipitor for stroke prevention - check BP at home  - BP goal 130-150 due to left M1 occlusion - Follow up with your primary care physician for stroke risk factor modification. Recommend maintain blood pressure goal 130-150/70-90, diabetes with hemoglobin A1c goal below 6.5% and lipids with LDL cholesterol goal below 70 mg/dL.  - quit smoking - refer to PT/OT for wlking difficulty near your home - limit alcohol to 2 beers per day - home exercise and healthy diet - follow up in 6 months.   Rosalin Hawking, MD PhD [facility] [address] [place], [place] [address] [phone]

## 2016-02-26 NOTE — Patient Instructions (Addendum)
-   continue ASA and lipitor for stroke prevention - check BP at home  - BP goal 130-150 due to left M1 occlusion - Follow up with your primary care physician for stroke risk factor modification. Recommend maintain blood pressure goal 130-150/70-90, diabetes with hemoglobin A1c goal below 6.5% and lipids with LDL cholesterol goal below 70 mg/dL.  - quit smoking - refer to PT/OT for wlking difficulty near your home - limit alcohol to 2 beers per day - home exercise and healthy diet - follow up in 6 months.

## 2016-02-29 ENCOUNTER — Telehealth: Payer: Self-pay | Admitting: Neurology

## 2016-02-29 NOTE — Telephone Encounter (Signed)
Patient has not decided where he wants to go for physical therapy. Patient wanted me to call his friend Shauna Hugh I have left her a message x 2 .

## 2016-03-04 ENCOUNTER — Encounter: Payer: Self-pay | Admitting: Gastroenterology

## 2016-03-04 ENCOUNTER — Ambulatory Visit (AMBULATORY_SURGERY_CENTER): Payer: Medicaid Other | Admitting: Gastroenterology

## 2016-03-04 VITALS — BP 113/70 | HR 68 | Temp 99.6°F | Resp 17 | Ht 66.0 in | Wt 168.0 lb

## 2016-03-04 DIAGNOSIS — Z1211 Encounter for screening for malignant neoplasm of colon: Secondary | ICD-10-CM

## 2016-03-04 DIAGNOSIS — D123 Benign neoplasm of transverse colon: Secondary | ICD-10-CM

## 2016-03-04 DIAGNOSIS — Z1212 Encounter for screening for malignant neoplasm of rectum: Secondary | ICD-10-CM

## 2016-03-04 DIAGNOSIS — D122 Benign neoplasm of ascending colon: Secondary | ICD-10-CM

## 2016-03-04 MED ORDER — SODIUM CHLORIDE 0.9 % IV SOLN: 500.0000 mL | INTRAVENOUS | Status: DC

## 2016-03-04 NOTE — Op Note (Signed)
[facility] Patient Name: Michael Davenport Procedure Date: 03/04/2016 9:32 AM MRN: [id number] Endoscopist: Remo Lipps P. Ellanore Vanhook MD, MD Age: 56 Referring MD:  Date of Birth: December 09, 1960 Gender: Male Account #: [id number] Procedure:                Colonoscopy Indications:              Screening for malignant neoplasm in the colon, This                            is the patient's first colonoscopy, history of CVA                            on aspirin Medicines:                Monitored Anesthesia Care Procedure:                Pre-Anesthesia Assessment:                           - Prior to the procedure, a History and Physical                            was performed, and patient medications and                            allergies were reviewed. The patient's tolerance of                            previous anesthesia was also reviewed. The risks                            and benefits of the procedure and the sedation                            options and risks were discussed with the patient.                            All questions were answered, and informed consent                            was obtained. Prior Anticoagulants: The patient has                            taken aspirin, last dose was 1 day prior to                            procedure. ASA Grade Assessment: II - A patient                            with mild systemic disease. After reviewing the                            risks and benefits, the patient was deemed in  satisfactory condition to undergo the procedure.                           After obtaining informed consent, the colonoscope                            was passed under direct vision. Throughout the                            procedure, the patient's blood pressure, pulse, and                            oxygen saturations were monitored continuously. The                            Model CF-HQ190L [id number]) scope was  introduced                            through the anus and advanced to the the cecum,                            identified by appendiceal orifice and ileocecal                            valve. The colonoscopy was performed without                            difficulty. The patient tolerated the procedure                            well. The quality of the bowel preparation was                            adequate. The ileocecal valve, appendiceal orifice,                            and rectum were photographed. Scope In: 9:39:34 AM Scope Out: 10:00:01 AM Scope Withdrawal Time: 0 hours 18 minutes 4 seconds  Total Procedure Duration: 0 hours 20 minutes 27 seconds  Findings:                 The perianal and digital rectal examinations were                            normal.                           Two sessile polyps were found in the ascending                            colon. The polyps were 4 to 7 mm in size. These                            polyps were removed with a cold snare. Resection  and retrieval were complete.                           A 4 mm polyp was found in the splenic flexure. The                            polyp was sessile. The polyp was removed with a                            cold snare. Resection and retrieval were complete.                           Multiple medium-mouthed diverticula were found in                            the left colon.                           Non-bleeding hemorrhoids were found during                            retroflexion. The hemorrhoids were small.                           The exam was otherwise without abnormality. Complications:            No immediate complications. Estimated blood loss:                            Minimal. Estimated Blood Loss:     Estimated blood loss was minimal. Impression:               - Two 4 to 7 mm polyps in the ascending colon,                            removed with a cold snare.  Resected and retrieved.                           - One 4 mm polyp at the splenic flexure, removed                            with a cold snare. Resected and retrieved.                           - Diverticulosis in the left colon.                           - Non-bleeding hemorrhoids.                           - The examination was otherwise normal. Recommendation:           - Patient has a contact number available for                            emergencies. The signs  and symptoms of potential                            delayed complications were discussed with the                            patient. Return to normal activities tomorrow.                            Written discharge instructions were provided to the                            patient.                           - Resume previous diet.                           - Continue present medications including aspirin                           - No other NSAIDs such as ibuprofen, naproxen, or                            other non-steroidal anti-inflammatory drugs for 2                            weeks after polyp removal.                           - Await pathology results.                           - Repeat colonoscopy is recommended for                            surveillance. The colonoscopy date will be                            determined after pathology results from today's                            exam become available for review. Remo Lipps P. Lorenia Hoston MD, MD 03/04/2016 10:04:52 AM This report has been signed electronically.

## 2016-03-04 NOTE — Progress Notes (Signed)
Called to room to assist during endoscopic procedure.  Patient ID and intended procedure confirmed with present staff. Received instructions for my participation in the procedure from the performing physician.  

## 2016-03-04 NOTE — Progress Notes (Signed)
Report to PACU, RN, vss, BBS= Clear.  

## 2016-03-04 NOTE — Patient Instructions (Signed)
Impression/Recommendations:  Polyp handout given to patient. Diverticulosis handout given to patient. Hemorrhoids handout given to patient.  No aspirin, ibuprofen, naproxen, or other NSAIDS for 2 weeks following polyp removal.   Tylenol only until Feb. 13, 2018.  Repeat colonoscopy for surveillance.   Date to be determined based on pathology results.  YOU HAD AN ENDOSCOPIC PROCEDURE TODAY AT [facility]:   Refer to the procedure report that was given to you for any specific questions about what was found during the examination.  If the procedure report does not answer your questions, please call your gastroenterologist to clarify.  If you requested that your care partner not be given the details of your procedure findings, then the procedure report has been included in a sealed envelope for you to review at your convenience later.  YOU SHOULD EXPECT: Some feelings of bloating in the abdomen. Passage of more gas than usual.  Walking can help get rid of the air that was put into your GI tract during the procedure and reduce the bloating. If you had a lower endoscopy (such as a colonoscopy or flexible sigmoidoscopy) you may notice spotting of blood in your stool or on the toilet paper. If you underwent a bowel prep for your procedure, you may not have a normal bowel movement for a few days.  Please Note:  You might notice some irritation and congestion in your nose or some drainage.  This is from the oxygen used during your procedure.  There is no need for concern and it should clear up in a day or so.  SYMPTOMS TO REPORT IMMEDIATELY:   Following lower endoscopy (colonoscopy or flexible sigmoidoscopy):  Excessive amounts of blood in the stool  Significant tenderness or worsening of abdominal pains  Swelling of the abdomen that is new, acute  Fever of 100F or higher For urgent or emergent issues, a gastroenterologist can be reached at any hour by calling [phone]  [phone].   DIET:  We do recommend a small meal at first, but then you may proceed to your regular diet.  Drink plenty of fluids but you should avoid alcoholic beverages for 24 hours.  ACTIVITY:  You should plan to take it easy for the rest of today and you should NOT DRIVE or use heavy machinery until tomorrow (because of the sedation medicines used during the test).    FOLLOW UP: Our staff will call the number listed on your records the next business day following your procedure to check on you and address any questions or concerns that you may have regarding the information given to you following your procedure. If we do not reach you, we will leave a message.  However, if you are feeling well and you are not experiencing any problems, there is no need to return our call.  We will assume that you have returned to your regular daily activities without incident.  If any biopsies were taken you will be contacted by phone or by letter within the next 1-3 weeks.  Please call us at [phone] if you have not heard about the biopsies in 3 weeks.    SIGNATURES/CONFIDENTIALITY: You and/or your care partner have signed paperwork which will be entered into your electronic medical record.  These signatures attest to the fact that that the information above on your After Visit Summary has been reviewed and is understood.  Full responsibility of the confidentiality of this discharge information lies with you and/or your care-partner.

## 2016-03-05 ENCOUNTER — Telehealth: Payer: Self-pay

## 2016-03-05 NOTE — Telephone Encounter (Signed)
  Follow up Call-  Call back number 03/04/2016  Post procedure Call Back phone  # [phone]  Permission to leave phone message Yes  Some recent data might be hidden     Patient questions:  Do you have a fever, pain , or abdominal swelling? No. Pain Score  0 *  Have you tolerated food without any problems? Yes.    Have you been able to return to your normal activities? Yes.    Do you have any questions about your discharge instructions: Diet   No. Medications  No. Follow up visit  No.  Do you have questions or concerns about your Care? No.  Actions: * If pain score is 4 or above: No action needed, pain <4.

## 2016-03-08 ENCOUNTER — Encounter: Payer: Self-pay | Admitting: Gastroenterology

## 2016-03-12 ENCOUNTER — Other Ambulatory Visit: Payer: Self-pay | Admitting: Family Medicine

## 2016-03-14 ENCOUNTER — Other Ambulatory Visit: Payer: Self-pay

## 2016-03-14 DIAGNOSIS — I10 Essential (primary) hypertension: Secondary | ICD-10-CM

## 2016-03-14 MED ORDER — AMLODIPINE BESYLATE 10 MG PO TABS: 10.0000 mg | ORAL_TABLET | Freq: Every day | ORAL | 1 refills | Status: DC

## 2016-03-14 NOTE — Telephone Encounter (Signed)
Called Patient's girlfriend again to see where they want to go for physical Therapy  x3 left her messages.  When I speak to patient he wants me to call girl friend because she is his transportation.

## 2016-03-14 NOTE — Telephone Encounter (Signed)
Noted. Thank you for your effort.  Rosalin Hawking, MD PhD Stroke Neurology 03/14/2016 3:55 PM

## 2016-05-27 ENCOUNTER — Ambulatory Visit (INDEPENDENT_AMBULATORY_CARE_PROVIDER_SITE_OTHER): Payer: Medicaid Other | Admitting: Family Medicine

## 2016-05-27 ENCOUNTER — Encounter: Payer: Self-pay | Admitting: Family Medicine

## 2016-05-27 VITALS — BP 132/77 | HR 84 | Temp 98.5°F | Resp 18 | Ht 66.0 in | Wt 172.0 lb

## 2016-05-27 DIAGNOSIS — R220 Localized swelling, mass and lump, head: Secondary | ICD-10-CM | POA: Diagnosis not present

## 2016-05-27 DIAGNOSIS — K0510 Chronic gingivitis, plaque induced: Secondary | ICD-10-CM

## 2016-05-27 DIAGNOSIS — I10 Essential (primary) hypertension: Secondary | ICD-10-CM | POA: Diagnosis not present

## 2016-05-27 DIAGNOSIS — M79604 Pain in right leg: Secondary | ICD-10-CM

## 2016-05-27 DIAGNOSIS — K0889 Other specified disorders of teeth and supporting structures: Secondary | ICD-10-CM | POA: Diagnosis not present

## 2016-05-27 DIAGNOSIS — F17200 Nicotine dependence, unspecified, uncomplicated: Secondary | ICD-10-CM | POA: Diagnosis not present

## 2016-05-27 LAB — POCT URINALYSIS DIP (DEVICE)
Bilirubin Urine: NEGATIVE
GLUCOSE, UA: NEGATIVE mg/dL
Hgb urine dipstick: NEGATIVE
Ketones, ur: NEGATIVE mg/dL
LEUKOCYTES UA: NEGATIVE
Nitrite: NEGATIVE
Protein, ur: NEGATIVE mg/dL
Specific Gravity, Urine: 1.015 (ref 1.005–1.030)
UROBILINOGEN UA: 0.2 mg/dL (ref 0.0–1.0)
pH: 5.5 (ref 5.0–8.0)

## 2016-05-27 LAB — COMPLETE METABOLIC PANEL WITH GFR
ALBUMIN: 4.1 g/dL (ref 3.6–5.1)
ALK PHOS: 58 U/L (ref 40–115)
ALT: 18 U/L (ref 9–46)
AST: 24 U/L (ref 10–35)
BILIRUBIN TOTAL: 0.6 mg/dL (ref 0.2–1.2)
BUN: 12 mg/dL (ref 7–25)
CALCIUM: 9.4 mg/dL (ref 8.6–10.3)
CO2: 26 mmol/L (ref 20–31)
CREATININE: 1.03 mg/dL (ref 0.70–1.33)
Chloride: 106 mmol/L (ref 98–110)
GFR, Est Non African American: 80 mL/min (ref >=60)
Glucose, Bld: 105 mg/dL — ABNORMAL HIGH (ref 65–99)
Potassium: 4.3 mmol/L (ref 3.5–5.3)
Sodium: 139 mmol/L (ref 135–146)
Total Protein: 6.4 g/dL (ref 6.1–8.1)

## 2016-05-27 MED ORDER — AMLODIPINE BESYLATE 10 MG PO TABS: 10.0000 mg | ORAL_TABLET | Freq: Every day | ORAL | 1 refills | Status: DC

## 2016-05-27 MED ORDER — LISINOPRIL 10 MG PO TABS
10.0000 mg | ORAL_TABLET | Freq: Every day | ORAL | 1 refills | Status: DC
Start: 2016-05-27 — End: 2016-08-26

## 2016-05-27 MED ORDER — AMOXICILLIN 500 MG PO CAPS: 500.0000 mg | ORAL_CAPSULE | Freq: Two times a day (BID) | ORAL | 0 refills | Status: DC

## 2016-05-27 MED ORDER — ACETAMINOPHEN-CODEINE #3 300-30 MG PO TABS: 1.0000 | ORAL_TABLET | Freq: Four times a day (QID) | ORAL | 0 refills | Status: DC | PRN

## 2016-05-27 MED ORDER — NAPROXEN 500 MG PO TABS: 500.0000 mg | ORAL_TABLET | Freq: Two times a day (BID) | ORAL | 0 refills | Status: DC

## 2016-05-27 NOTE — Progress Notes (Signed)
Subjective:    Patient ID: Michael Davenport, male    DOB: 07/28/1960, 56 y.o.   MRN: [id number]  HPI Michael Davenport, a 56 year old male with a history of hypertension, alcohol abuse and stroke presents for a 1 month follow up of hypertension. Michael Davenport is accompanied by his wife. He is taking antihypertensive medications consistently and following a low sodium diet. He is currently checking blood pressures at home.  Patient denies headache, dizziness,  chest pain, dyspnea, fatigue, irregular heart beat, lower extremity edema, near-syncope, palpitations, syncope and tachypnea.  Cardiovascular risk factors include: dyslipidemia, hypertension, male gender, sedentary lifestyle and smoking/ tobacco exposure.   Patient is complaining of dental pain. He has some broken teeth and cavities on the right side. He has been having dental pain for 2 weeks. Right dental pain and right facial swelling.  Patient took Ibuprofen and ambesol with minimal relief. Pain is described as constant and throbbing.   Patient is complaining of right leg pain.  Symptoms have been present for several months and include pain and stiffness. Symptoms are worst with prolonged standing. Exacerbating factors identifiable by patient are standing and walking.  He says that he has taking OTC Ibuprofen without sustained relief.  Past Medical History:  Diagnosis Date  . ETOH abuse 01/23/2015  . HTN (hypertension)   . Stroke [facility])    Social History   Social History  . Marital status: Single    Spouse name: N/A  . Number of children: N/A  . Years of education: N/A   Occupational History  . Not on file.   Social History Main Topics  . Smoking status: Current Every Day Smoker    Packs/day: 0.25    Types: Cigarettes  . Smokeless tobacco: Never Used     Comment: smokes 10 cigarettes a day  . Alcohol use 8.4 oz/week    14 Cans of beer per week     Comment: occas drinking drinks 2 beers a day  . Drug use: No  . Sexual  activity: Not on file   Other Topics Concern  . Not on file   Social History Narrative  . No narrative on file   Immunization History  Administered Date(s) Administered  . Influenza,inj,Quad PF,36+ Mos 01/20/2015, 11/27/2015  . Tdap 11/27/2015  No Known Allergies Review of Systems  Constitutional: Negative for fatigue and unexpected weight change.  HENT: Positive for dental problem (right dental pain).   Eyes: Negative.  Negative for visual disturbance.  Respiratory: Negative.  Negative for shortness of breath and wheezing.   Cardiovascular: Negative.  Negative for chest pain, palpitations and leg swelling.  Gastrointestinal: Negative.   Endocrine: Negative.  Negative for polydipsia, polyphagia and polyuria.  Genitourinary: Negative.   Musculoskeletal: Positive for myalgias (right leg pain).  Skin: Negative.   Allergic/Immunologic: Negative.   Neurological: Positive for weakness (right side). Negative for dizziness, tremors, syncope, facial asymmetry, speech difficulty, light-headedness and headaches.  Hematological: Negative.   Psychiatric/Behavioral: Negative.      Objective:   Physical Exam  Constitutional: He is oriented to person, place, and time. He appears well-developed and well-nourished.  HENT:  Head: Normocephalic and atraumatic.  Right Ear: External ear normal.  Left Ear: External ear normal.  Nose: Nose normal.  Mouth/Throat: Oropharynx is clear and moist. Abnormal dentition (partially edentulous). Dental abscesses (gum erythema/edema right upper gumline) and dental caries present.  Right facial edema/tender to palpation  Eyes: Conjunctivae and EOM are normal. Pupils are equal, round, and  reactive to light.  Neck: Normal range of motion. Neck supple.  Cardiovascular: Normal rate, regular rhythm, normal heart sounds and intact distal pulses.   Pulmonary/Chest: Effort normal and breath sounds normal.  Abdominal: Soft. Bowel sounds are normal.  Musculoskeletal:        Lumbar back: He exhibits decreased range of motion and pain. He exhibits no tenderness and no spasm.  Neurological: He is alert and oriented to person, place, and time. He has normal reflexes. He exhibits abnormal muscle tone (right side). Coordination and gait abnormal.  Skin: Skin is warm and dry.  Psychiatric: He has a normal mood and affect. His behavior is normal. Judgment and thought content normal.   BP 132/77 (BP Location: Right Arm, Patient Position: Sitting, Cuff Size: Small)   Pulse 84   Temp 98.5 F (36.9 C) (Oral)   Resp 18   Ht 5\' 6"  (1.676 m)   Wt 172 lb (78 kg)   SpO2 98%   BMI 27.76 kg/m     Assessment & Plan:   1. Essential hypertension Blood pressure is at goal on current medication regimen. The patient is asked to make an attempt to improve diet and exercise patterns to aid in medical management of this problem. - COMPLETE METABOLIC PANEL WITH GFR - amLODipine (NORVASC) 10 MG tablet; Take 1 tablet (10 mg total) by mouth daily.  Dispense: 90 tablet; Refill: 1 - lisinopril (PRINIVIL,ZESTRIL) 10 MG tablet; Take 1 tablet (10 mg total) by mouth daily with supper.  Dispense: 90 tablet; Refill: 1  2. Pain, dental Patient given information to schedule a dental evaluation with Dr. Lutricia Feil - amoxicillin (AMOXIL) 500 MG capsule; Take 1 capsule (500 mg total) by mouth 2 (two) times daily.  Dispense: 14 capsule; Refill: 0 - acetaminophen-codeine (TYLENOL #3) 300-30 MG tablet; Take 1 tablet by mouth every 6 (six) hours as needed for moderate pain.  Dispense: 20 tablet; Refill: 0 - naproxen (NAPROSYN) 500 MG tablet; Take 1 tablet (500 mg total) by mouth 2 (two) times daily with a meal.  Dispense: 15 tablet; Refill: 0  3. Gum inflammation - amoxicillin (AMOXIL) 500 MG capsule; Take 1 capsule (500 mg total) by mouth 2 (two) times daily.  Dispense: 14 capsule; Refill: 0  4. Swelling of right side of face - amoxicillin (AMOXIL) 500 MG capsule; Take 1 capsule (500 mg  total) by mouth 2 (two) times daily.  Dispense: 14 capsule; Refill: 0  5. Right leg pain - C-reactive protein - Sedimentation Rate - Rheumatoid factor - Uric Acid - acetaminophen-codeine (TYLENOL #3) 300-30 MG tablet; Take 1 tablet by mouth every 6 (six) hours as needed for moderate pain.  Dispense: 20 tablet; Refill: 0  6. Tobacco use disorder Smoking cessation instruction/counseling given:  counseled patient on the dangers of tobacco use, advised patient to stop smoking, and reviewed strategies to maximize success    RTC:  3 months for hypertension  Donia Pounds  MSN, FNP-C [facility] [place], [place] [address] [phone]     The patient was given clear instructions to go to ER or return to medical center if symptoms do not improve, worsen or new problems develop. The patient verbalized understanding.

## 2016-05-27 NOTE — Patient Instructions (Signed)
Dental Pain:  Dr. Lutricia Feil [phone], dentist. Please schedule an appointment for further work up and evaluation Will start Amoxicillin 500 mg twice daily for gum inflammation Tylenol # 3 every 6 hours as needed  for moderate to severe right tooth pain Naproxen 500 mg twice daily as needed for right dental pain  Apply ice pack to right face 20 minutes 4 times per day intermittently interchangably with warm compresses.   Decrease smoking   Will follow up by phone with abnormal laboratory results

## 2016-05-28 LAB — SEDIMENTATION RATE: SED RATE: 1 mm/h (ref 0–20)

## 2016-05-28 LAB — URIC ACID: Uric Acid, Serum: 5.1 mg/dL (ref 4.0–8.0)

## 2016-05-28 LAB — C-REACTIVE PROTEIN: CRP: 3.5 mg/L (ref <8.0)

## 2016-05-28 LAB — RHEUMATOID FACTOR: Rheumatoid fact SerPl-aCnc: <14 IU/mL (ref <14)

## 2016-06-09 ENCOUNTER — Other Ambulatory Visit: Payer: Self-pay | Admitting: Family Medicine

## 2016-07-30 ENCOUNTER — Encounter: Payer: Self-pay | Admitting: Physical Therapy

## 2016-07-30 NOTE — Therapy (Signed)
[facility] [address] [place], [place], [address] Phone: [phone]   Fax:  [phone]  Patient Details  Name: Andree Golphin MRN: [id number] Date of Birth: Sep 13, 1960 Referring Provider:  No ref. provider found  Encounter Date: 07/30/2016   Pt attended PT eval only - pt's insurance is Medicaid pending; pt attended physical therapy appt. on 03-28-15 but was informed that Medicaid had not approved visits for PT and he declined treatment due to lack of coverage.  Pt did not return for any PT sessions.    Alda Lea, PT 07/30/2016, 10:34 AM  [facility] [address] [place], [place], [address] Phone: [phone]   Fax:  [phone]

## 2016-08-26 ENCOUNTER — Ambulatory Visit (INDEPENDENT_AMBULATORY_CARE_PROVIDER_SITE_OTHER): Payer: Medicaid Other | Admitting: Nurse Practitioner

## 2016-08-26 ENCOUNTER — Encounter: Payer: Self-pay | Admitting: Family Medicine

## 2016-08-26 ENCOUNTER — Ambulatory Visit (HOSPITAL_COMMUNITY)
Admission: RE | Admit: 2016-08-26 | Discharge: 2016-08-26 | Disposition: A | Discharge status: Home / Self Care | Payer: Medicaid Other | Source: Ambulatory Visit | Attending: Family Medicine | Admitting: Family Medicine

## 2016-08-26 ENCOUNTER — Ambulatory Visit (INDEPENDENT_AMBULATORY_CARE_PROVIDER_SITE_OTHER): Payer: Medicaid Other | Admitting: Family Medicine

## 2016-08-26 ENCOUNTER — Encounter: Payer: Self-pay | Admitting: Nurse Practitioner

## 2016-08-26 VITALS — BP 117/72 | HR 68 | Wt 174.0 lb

## 2016-08-26 VITALS — BP 139/76 | HR 72 | Temp 98.2°F | Resp 16 | Ht 66.0 in | Wt 175.0 lb

## 2016-08-26 DIAGNOSIS — G8929 Other chronic pain: Secondary | ICD-10-CM | POA: Diagnosis present

## 2016-08-26 DIAGNOSIS — R400 Somnolence: Secondary | ICD-10-CM | POA: Diagnosis not present

## 2016-08-26 DIAGNOSIS — I63411 Cerebral infarction due to embolism of right middle cerebral artery: Secondary | ICD-10-CM

## 2016-08-26 DIAGNOSIS — M25561 Pain in right knee: Secondary | ICD-10-CM | POA: Diagnosis present

## 2016-08-26 DIAGNOSIS — G459 Transient cerebral ischemic attack, unspecified: Secondary | ICD-10-CM | POA: Diagnosis not present

## 2016-08-26 DIAGNOSIS — E785 Hyperlipidemia, unspecified: Secondary | ICD-10-CM

## 2016-08-26 DIAGNOSIS — F17200 Nicotine dependence, unspecified, uncomplicated: Secondary | ICD-10-CM | POA: Diagnosis not present

## 2016-08-26 DIAGNOSIS — G629 Polyneuropathy, unspecified: Secondary | ICD-10-CM

## 2016-08-26 DIAGNOSIS — M1711 Unilateral primary osteoarthritis, right knee: Secondary | ICD-10-CM | POA: Insufficient documentation

## 2016-08-26 DIAGNOSIS — I10 Essential (primary) hypertension: Secondary | ICD-10-CM

## 2016-08-26 LAB — COMPLETE METABOLIC PANEL WITH GFR
ALBUMIN: 4.6 g/dL (ref 3.6–5.1)
ALK PHOS: 57 U/L (ref 40–115)
ALT: 25 U/L (ref 9–46)
AST: 24 U/L (ref 10–35)
BILIRUBIN TOTAL: 0.5 mg/dL (ref 0.2–1.2)
BUN: 8 mg/dL (ref 7–25)
CO2: 25 mmol/L (ref 20–31)
CREATININE: 0.98 mg/dL (ref 0.70–1.33)
Calcium: 9.5 mg/dL (ref 8.6–10.3)
Chloride: 103 mmol/L (ref 98–110)
GFR, EST NON AFRICAN AMERICAN: 85 mL/min (ref >=60)
GLUCOSE: 89 mg/dL (ref 65–99)
Potassium: 4.4 mmol/L (ref 3.5–5.3)
SODIUM: 138 mmol/L (ref 135–146)
Total Protein: 7 g/dL (ref 6.1–8.1)

## 2016-08-26 LAB — POCT URINALYSIS DIP (DEVICE)
BILIRUBIN URINE: NEGATIVE
Glucose, UA: NEGATIVE mg/dL
HGB URINE DIPSTICK: NEGATIVE
Ketones, ur: NEGATIVE mg/dL
Leukocytes, UA: NEGATIVE
NITRITE: NEGATIVE
PH: 7 (ref 5.0–8.0)
Protein, ur: NEGATIVE mg/dL
Specific Gravity, Urine: <=1.005 (ref 1.005–1.030)
UROBILINOGEN UA: 0.2 mg/dL (ref 0.0–1.0)

## 2016-08-26 LAB — LIPID PANEL
Cholesterol: 129 mg/dL (ref <200)
HDL: 81 mg/dL (ref >40)
LDL Cholesterol: 41 mg/dL (ref <100)
TRIGLYCERIDES: 33 mg/dL (ref <150)
Total CHOL/HDL Ratio: 1.6 Ratio (ref <5.0)
VLDL: 7 mg/dL (ref <30)

## 2016-08-26 MED ORDER — LISINOPRIL 10 MG PO TABS: 10.0000 mg | ORAL_TABLET | Freq: Every day | ORAL | 1 refills | Status: DC

## 2016-08-26 MED ORDER — GABAPENTIN 300 MG PO CAPS: 300.0000 mg | ORAL_CAPSULE | Freq: Three times a day (TID) | ORAL | 3 refills | Status: DC

## 2016-08-26 NOTE — Patient Instructions (Addendum)
Continue aspirin for secondary stroke prevention Continue Lipitor for hyperlipidemia Blood pressure well controlled at 117/72 continue medication  Continue to follow up with primary care regarding stroke risk factor modification No more than 2 alcoholic drinks per day Healthy diet, try to exercise May resume driving Will get sleep study Follow-up 6 months

## 2016-08-26 NOTE — Progress Notes (Signed)
Subjective:    Patient ID: Michael Davenport, male    DOB: [birth year], 58 y.o.   MRN: [id number]  HPI [name], a 56 year old male with a history of hypertension, alcohol abuse and stroke presents for a 3 month follow up of chronic conditions. [name] is accompanied by his wife Michael Davenport. He is taking antihypertensive medications consistently and following a low sodium diet. He is currently checking blood pressures at home.  Patient denies headache, dizziness,  chest pain, dyspnea, fatigue, irregular heart beat, lower extremity edema, near-syncope, palpitations, syncope and tachypnea.  Cardiovascular risk factors include: dyslipidemia, hypertension, male gender, sedentary lifestyle and smoking/ tobacco exposure.  Patient is complaining of right leg pain with swelling to right ankle.  Symptoms have been present for several months and include pain and stiffness. Pain is primarily over the knee. Symptoms are worst with prolonged standing. Exacerbating factors identifiable by patient are standing and walking.  He says that he has taking OTC Ibuprofen without sustained relief. Current pain intensity is 5/10 described as intermittent and throbbing. Most swelling is in right ankle.  Past Medical History:  Diagnosis Date  . ETOH abuse 01/23/2015  . HTN (hypertension)   . Stroke [facility])    Social History   Social History  . Marital status: Single    Spouse name: N/A  . Number of children: N/A  . Years of education: N/A   Occupational History  . Not on file.   Social History Main Topics  . Smoking status: Current Every Day Smoker    Packs/day: 0.25    Types: Cigarettes  . Smokeless tobacco: Never Used     Comment: smokes 10 cigarettes a day  . Alcohol use 8.4 oz/week    14 Cans of beer per week     Comment: occas drinking drinks 2 beers a day  . Drug use: No  . Sexual activity: Not on file   Other Topics Concern  . Not on file   Social History Narrative  . No narrative on file    Immunization History  Administered Date(s) Administered  . Influenza,inj,Quad PF,36+ Mos 01/20/2015, 11/27/2015  . Tdap 11/27/2015  No Known Allergies Review of Systems  Constitutional: Negative for fatigue and unexpected weight change.  Eyes: Negative.  Negative for visual disturbance.  Respiratory: Negative.  Negative for shortness of breath and wheezing.   Cardiovascular: Negative.  Negative for chest pain, palpitations and leg swelling.  Gastrointestinal: Negative.   Endocrine: Negative.  Negative for polydipsia, polyphagia and polyuria.  Genitourinary: Negative.   Musculoskeletal: Positive for myalgias (right leg pain).  Skin: Negative.   Allergic/Immunologic: Negative.   Neurological: Positive for speech difficulty (Slurred speech), weakness (right side) and numbness (Primarily to left side). Negative for dizziness, tremors, syncope, facial asymmetry, light-headedness and headaches.  Hematological: Negative.   Psychiatric/Behavioral: Negative.      Objective:   Physical Exam  Constitutional: He is oriented to person, place, and time. He appears well-developed and well-nourished.  HENT:  Head: Normocephalic and atraumatic.  Right Ear: External ear normal.  Left Ear: External ear normal.  Nose: Nose normal.  Mouth/Throat: Oropharynx is clear and moist.  Right facial edema/tender to palpation  Eyes: Pupils are equal, round, and reactive to light. Conjunctivae and EOM are normal.  Neck: Normal range of motion. Neck supple.  Cardiovascular: Normal rate, regular rhythm, normal heart sounds and intact distal pulses.   Pulmonary/Chest: Effort normal and breath sounds normal.  Abdominal: Soft. Bowel sounds are normal.  Musculoskeletal:       Lumbar back: He exhibits decreased range of motion and pain. He exhibits no tenderness and no spasm.  Trace edema to right ankle  Neurological: He is alert and oriented to person, place, and time. He has normal reflexes. He exhibits  abnormal muscle tone (right side). Coordination and gait abnormal.  Skin: Skin is warm and dry.  Psychiatric: He has a normal mood and affect. His behavior is normal. Judgment and thought content normal.   BP 139/76 (BP Location: Left Arm, Patient Position: Sitting, Cuff Size: Normal)   Pulse 72   Temp 98.2 F (36.8 C) (Oral)   Resp 16   Ht 5\' 6"  (1.676 m)   Wt 175 lb (79.4 kg)   SpO2 100%   BMI 28.25 kg/m     Assessment & Plan:  1. Essential hypertension Blood pressure is at goal on current medication regimen.  Reviewed urinalysis, no proteinuria present. Will review renal functioning as results come available.  - Lipid Panel - COMPLETE METABOLIC PANEL WITH GFR - POCT urinalysis dip (device) - lisinopril (PRINIVIL,ZESTRIL) 10 MG tablet; Take 1 tablet (10 mg total) by mouth daily with supper.  Dispense: 90 tablet; Refill: 1 2. Neuropathy Neuropathy primarily to left side. Will start a trial of gabapentin 300 mg TID. Will follow up in 1 month.  - gabapentin (NEURONTIN) 300 MG capsule; Take 1 capsule (300 mg total) by mouth 3 (three) times daily.  Dispense: 90 capsule; Refill: 3  3. Chronic pain of right knee Mr. Mccubbins is complaining of worsening knee pain to right knee. Advised to elevated right knee to heart level while at rest.  Apply warm, moist compresses to right knee as needed. Recommend Tylenol 500 mg every 6 hours as needed.  Refrain - DG Knee Complete 4 Views Right; Future  4. Dyslipidemia Will review lipid panel. Continue Atorvastatin 40 mg and Aspirin 325 mg daily d/t history of CVA.  - Lipid Panel   RTC: 1 month for neuropathy   The patient was given clear instructions to go to ER or return to medical center if symptoms do not improve, worsen or new problems develop. The patient verbalized understanding. Will notify patient with laboratory results.    Michael Pounds  MSN, FNP-C [facility] [address] [place], [place]  [address] [phone]

## 2016-08-26 NOTE — Progress Notes (Signed)
I reviewed above note and agree with the assessment and plan.  Rosalin Hawking, MD PhD Stroke Neurology 08/26/2016 1:19 PM

## 2016-08-26 NOTE — Patient Instructions (Signed)
Chroniic right knee pain: Will review xray and follow up by phone with a plan of care.  Neuropathy: Will start a trial of gabapentin 300 mg three times daily. Do not drink alcohol or operate machinery while taking this medication  Right ankle swelling: Elevate to heart level while at rest.    DASH Eating Plan DASH stands for "Dietary Approaches to Stop Hypertension." The DASH eating plan is a healthy eating plan that has been shown to reduce high blood pressure (hypertension). It may also reduce your risk for type 2 diabetes, heart disease, and stroke. The DASH eating plan may also help with weight loss. What are tips for following this plan? General guidelines  Avoid eating more than 2,300 mg (milligrams) of salt (sodium) a day. If you have hypertension, you may need to reduce your sodium intake to 1,500 mg a day.  Limit alcohol intake to no more than 1 drink a day for nonpregnant women and 2 drinks a day for men. One drink equals 12 oz of beer, 5 oz of wine, or 1 oz of hard liquor.  Work with your health care provider to maintain a healthy body weight or to lose weight. Ask what an ideal weight is for you.  Get at least 30 minutes of exercise that causes your heart to beat faster (aerobic exercise) most days of the week. Activities may include walking, swimming, or biking.  Work with your health care provider or diet and nutrition specialist (dietitian) to adjust your eating plan to your individual calorie needs. Reading food labels  Check food labels for the amount of sodium per serving. Choose foods with less than 5 percent of the Daily Value of sodium. Generally, foods with less than 300 mg of sodium per serving fit into this eating plan.  To find whole grains, look for the word "whole" as the first word in the ingredient list. Shopping  Buy products labeled as "low-sodium" or "no salt added."  Buy fresh foods. Avoid canned foods and premade or frozen meals. Cooking  Avoid  adding salt when cooking. Use salt-free seasonings or herbs instead of table salt or sea salt. Check with your health care provider or pharmacist before using salt substitutes.  Do not fry foods. Cook foods using healthy methods such as baking, boiling, grilling, and broiling instead.  Cook with heart-healthy oils, such as olive, canola, soybean, or sunflower oil. Meal planning   Eat a balanced diet that includes: ? 5 or more servings of fruits and vegetables each day. At each meal, try to fill half of your plate with fruits and vegetables. ? Up to 6-8 servings of whole grains each day. ? Less than 6 oz of lean meat, poultry, or fish each day. A 3-oz serving of meat is about the same size as a deck of cards. One egg equals 1 oz. ? 2 servings of low-fat dairy each day. ? A serving of nuts, seeds, or beans 5 times each week. ? Heart-healthy fats. Healthy fats called Omega-3 fatty acids are found in foods such as flaxseeds and coldwater fish, like sardines, salmon, and mackerel.  Limit how much you eat of the following: ? Canned or prepackaged foods. ? Food that is high in trans fat, such as fried foods. ? Food that is high in saturated fat, such as fatty meat. ? Sweets, desserts, sugary drinks, and other foods with added sugar. ? Full-fat dairy products.  Do not salt foods before eating.  Try to eat at least 2  vegetarian meals each week.  Eat more home-cooked food and less restaurant, buffet, and fast food.  When eating at a restaurant, ask that your food be prepared with less salt or no salt, if possible. What foods are recommended? The items listed may not be a complete list. Talk with your dietitian about what dietary choices are best for you. Grains Whole-grain or whole-wheat bread. Whole-grain or whole-wheat pasta. Brown rice. [place]. Bulgur. Whole-grain and low-sodium cereals. Pita bread. Low-fat, low-sodium crackers. Whole-wheat flour tortillas. Vegetables Fresh or  frozen vegetables (raw, steamed, roasted, or grilled). Low-sodium or reduced-sodium tomato and vegetable juice. Low-sodium or reduced-sodium tomato sauce and tomato paste. Low-sodium or reduced-sodium canned vegetables. Fruits All fresh, dried, or frozen fruit. Canned fruit in natural juice (without added sugar). Meat and other protein foods Skinless chicken or [place]. Ground chicken or [place]. Pork with fat trimmed off. Fish and seafood. Egg whites. Dried beans, peas, or lentils. Unsalted nuts, nut butters, and seeds. Unsalted canned beans. Lean cuts of beef with fat trimmed off. Low-sodium, lean deli meat. Dairy Low-fat (1%) or fat-free (skim) milk. Fat-free, low-fat, or reduced-fat cheeses. Nonfat, low-sodium ricotta or cottage cheese. Low-fat or nonfat yogurt. Low-fat, low-sodium cheese. Fats and oils Soft margarine without trans fats. Vegetable oil. Low-fat, reduced-fat, or light mayonnaise and salad dressings (reduced-sodium). Canola, safflower, olive, soybean, and sunflower oils. Avocado. Seasoning and other foods Herbs. Spices. Seasoning mixes without salt. Unsalted popcorn and pretzels. Fat-free sweets. What foods are not recommended? The items listed may not be a complete list. Talk with your dietitian about what dietary choices are best for you. Grains Baked goods made with fat, such as croissants, muffins, or some breads. Dry pasta or rice meal packs. Vegetables Creamed or fried vegetables. Vegetables in a cheese sauce. Regular canned vegetables (not low-sodium or reduced-sodium). Regular canned tomato sauce and paste (not low-sodium or reduced-sodium). Regular tomato and vegetable juice (not low-sodium or reduced-sodium). Angie Fava. Olives. Fruits Canned fruit in a light or heavy syrup. Fried fruit. Fruit in cream or butter sauce. Meat and other protein foods Fatty cuts of meat. Ribs. Fried meat. Berniece Salines. Sausage. Bologna and other processed lunch meats. Salami. Fatback. Hotdogs.  Bratwurst. Salted nuts and seeds. Canned beans with added salt. Canned or smoked fish. Whole eggs or egg yolks. Chicken or [place] with skin. Dairy Whole or 2% milk, cream, and half-and-half. Whole or full-fat cream cheese. Whole-fat or sweetened yogurt. Full-fat cheese. Nondairy creamers. Whipped toppings. Processed cheese and cheese spreads. Fats and oils Butter. Stick margarine. Lard. Shortening. Ghee. Bacon fat. Tropical oils, such as coconut, palm kernel, or palm oil. Seasoning and other foods Salted popcorn and pretzels. Onion salt, garlic salt, seasoned salt, table salt, and sea salt. Worcestershire sauce. Tartar sauce. Barbecue sauce. Teriyaki sauce. Soy sauce, including reduced-sodium. Steak sauce. Canned and packaged gravies. Fish sauce. Oyster sauce. Cocktail sauce. Horseradish that you find on the shelf. Ketchup. Mustard. Meat flavorings and tenderizers. Bouillon cubes. Hot sauce and Tabasco sauce. Premade or packaged marinades. Premade or packaged taco seasonings. Relishes. Regular salad dressings. Where to find more information:  National Heart, Lung, and [place]: https://wilson-eaton.com/  American Heart Association: www.heart.org Summary  The DASH eating plan is a healthy eating plan that has been shown to reduce high blood pressure (hypertension). It may also reduce your risk for type 2 diabetes, heart disease, and stroke.  With the DASH eating plan, you should limit salt (sodium) intake to 2,300 mg a day. If you have hypertension, you may need  to reduce your sodium intake to 1,500 mg a day.  When on the DASH eating plan, aim to eat more fresh fruits and vegetables, whole grains, lean proteins, low-fat dairy, and heart-healthy fats.  Work with your health care provider or diet and nutrition specialist (dietitian) to adjust your eating plan to your individual calorie needs. This information is not intended to replace advice given to you by your health care provider. Make sure you  discuss any questions you have with your health care provider. Document Released: 01/10/2011 Document Revised: 01/15/2016 Document Reviewed: 01/15/2016 Elsevier Interactive Patient Education  2017 [place].  Knee Pain, Adult Many things can cause knee pain. The pain often goes away on its own with time and rest. If the pain does not go away, tests may be done to find out what is causing the pain. Follow these instructions at home: Activity  Rest your knee.  Do not do things that cause pain.  Avoid activities where both feet leave the ground at the same time (high-impact activities). Examples are running, jumping rope, and doing jumping jacks. General instructions  Take medicines only as told by your doctor.  Raise (elevate) your knee when you are resting. Make sure your knee is higher than your heart.  Sleep with a pillow under your knee.  If told, put ice on the knee: ? Put ice in a plastic bag. ? Place a towel between your skin and the bag. ? Leave the ice on for 20 minutes, 2-3 times a day.  Ask your doctor if you should wear an elastic knee support.  Lose weight if you are overweight. Being overweight can make your knee hurt more.  Do not use any tobacco products. These include cigarettes, chewing tobacco, or electronic cigarettes. If you need help quitting, ask your doctor. Smoking may slow down healing. Contact a doctor if:  The pain does not stop.  The pain changes or gets worse.  You have a fever along with knee pain.  Your knee gives out or locks up.  Your knee swells, and becomes worse. Get help right away if:  Your knee feels warm.  You cannot move your knee.  You have very bad knee pain.  You have chest pain.  You have trouble breathing. Summary  Many things can cause knee pain. The pain often goes away on its own with time and rest.  Avoid activities that put stress on your knee. These include running and jumping rope.  Get help right away  if you cannot move your knee, or if your knee feels warm, or if you have trouble breathing. This information is not intended to replace advice given to you by your health care provider. Make sure you discuss any questions you have with your health care provider. Document Released: 04/19/2008 Document Revised: 01/16/2016 Document Reviewed: 01/16/2016 Elsevier Interactive Patient Education  2017 [employer].

## 2016-08-26 NOTE — Progress Notes (Signed)
[facility]  PATIENT: Micajah Dennin DOB: 02/08/1960   REASON FOR VISIT: Follow-up for stroke HISTORY FROM: Patient and friend [name]    HISTORY OF PRESENT ILLNESS: Mr. Chais Fehringer is a 56 y.o. male with hx of heavy smoker, heavy drinker admitted on 01/18/15 for acute right sided weakness. MRI showed acute left PLIC infarct and chronic left CR infarct. MRA showed left M1 proximal cut off with diffuse athero. CUS and TTE unremarkable. LDL 133 and A1C 5.7. He was put on ASA 325 and lipitor as well as FA, B1 and multivitamin and recommended smoking cessation. Found to have HTN, put on low dose lisinopril. He was discharged in good condition.  02/16/15 follow up Dr. Erlinda Hong - the patient has been doing better. Still has subtle right sided weakness but largely recovered. BP still high today 168/103 and he did not check BP at home. On lisinopril 2.5mg  now but will  Need to increase the dose. He has no insurance now and is working on the financial support with [employer]. He has PCP first visit next week. Not quit smoking yet.   05/24/15 follow up Dr. Erlinda Hong- the patient has been doing stable from stroke standpoint. He still has right-sided numbness, but has increased LBP for the last 2 weeks which caused him left lower extremity numbness and mild walking difficulty. He also complains left lower extremity muscle cramping. PCP prescribed tramadol. Wife also complains more sedentary lifestyle at home, the patient not sure whether he feels depressed. BP today 130/85. He continues heavy drinking and smoking.  Interval History1/22/18 Dr. Erlinda Hong During the interval time, patient has been doing the same. He still has mild right-sided weakness and hemiplegic gait with right sensory decreased. He did not have PT/OT due to insurance issue and did not the way he was treated in PT facility. He has not quit smoking yet and complains of his wife continues smoking. He stated he has limited his drink for 2  drinks per day. BP today 145/81. He is on aspirin 81 instead of 325. UPDATE 07/23/2018CM Mr. Sookdeo, 56 year old male returns for follow-up with history of stroke event in December 2016. He is currently on aspirin .81MG  for secondary stroke prevention without further stroke or TIA symptoms. He has no bruising and bleeding. He is also on Lipitor without myalgias. Blood pressure well controlled in the office today at 117/72. He gets no regular exercise. Right lower leg weakness has improved. Patient is currently not driving. He has had ongoing issues with sleep and has never had a sleep study. He continues to smoke and says he has no more than 2 drinks a day. His brother died of an MI at the age of 64 since last seen. He returns for reevaluation REVIEW OF SYSTEMS: Full 14 system review of systems performed and notable only for those listed, all others are neg:  Constitutional: neg  Cardiovascular: neg Ear/Nose/Throat: neg  Skin: neg Eyes: neg Respiratory: neg Gastroitestinal: neg  Hematology/Lymphatic: neg  Endocrine: neg Musculoskeletal: Joint pain Allergy/Immunology: neg Neurological: Weakness Psychiatric: Depression Sleep : Insomnia frequent waking daytime drowsiness   ALLERGIES: No Known Allergies  HOME MEDICATIONS: Outpatient Medications Prior to Visit  Medication Sig Dispense Refill  . amLODipine (NORVASC) 10 MG tablet Take 1 tablet (10 mg total) by mouth daily. 90 tablet 1  . aspirin EC 325 MG EC tablet Take 1 tablet (325 mg total) by mouth daily.    Marland Kitchen atorvastatin (LIPITOR) 40 MG tablet TAKE 1 TABLET BY MOUTH  EVERY DAY 90 tablet 1  . gabapentin (NEURONTIN) 300 MG capsule Take 1 capsule (300 mg total) by mouth 3 (three) times daily. 90 capsule 3  . lisinopril (PRINIVIL,ZESTRIL) 10 MG tablet Take 1 tablet (10 mg total) by mouth daily with supper. 90 tablet 1  . Multiple Vitamin (MULTIVITAMIN WITH MINERALS) TABS tablet Take 1 tablet by mouth daily. Can get store brand. Available  over the counter 100 tablet 0  . traZODone (DESYREL) 50 MG tablet Take 1 tablet (50 mg total) by mouth at bedtime as needed for sleep. 30 tablet 3   Facility-Administered Medications Prior to Visit  Medication Dose Route Frequency Provider Last Rate Last Dose  . 0.9 %  sodium chloride infusion  500 mL Intravenous Continuous Armbruster, Renelda Loma, MD        PAST MEDICAL HISTORY: Past Medical History:  Diagnosis Date  . ETOH abuse 01/23/2015  . HTN (hypertension)   . Stroke [facility])     PAST SURGICAL HISTORY: History reviewed. No pertinent surgical history.  FAMILY HISTORY: Family History  Problem Relation Age of Onset  . Hypertension Brother   . Heart attack Brother   . Heart attack Father   . Heart disease Father   . Diabetes Sister   . Hyperlipidemia Sister   . Heart attack Sister   . Cancer Sister   . Hypertension Other   . Heart attack Brother   . Colon cancer Neg Hx     SOCIAL HISTORY: Social History   Social History  . Marital status: Single    Spouse name: N/A  . Number of children: 5  . Years of education: 9   Occupational History  .      unemployed   Social History Main Topics  . Smoking status: Current Every Day Smoker    Packs/day: 0.50    Types: Cigarettes  . Smokeless tobacco: Never Used     Comment: smokes 10 cigarettes a day  . Alcohol use 8.4 oz/week    14 Cans of beer per week     Comment: 08/26/16 pt states doesn't know how much daily  . Drug use: No  . Sexual activity: Not on file   Other Topics Concern  . Not on file   Social History Narrative   Lives with friend   Caffeine- coffee, 3 cups daily     PHYSICAL EXAM  Vitals:   08/26/16 1023  BP: 117/72  Pulse: 68  Weight: 174 lb (78.9 kg)   Body mass index is 28.08 kg/m.  Generalized: Well developed, Slightly obese male in no acute distress  Head: normocephalic and atraumatic,. Oropharynx benign  Neck: Supple, no carotid bruits  Cardiac: Regular rate rhythm, no murmur    Musculoskeletal: No deformity   Neurological examination   Mentation: Alert oriented to time, place, history taking. Attention span and concentration appropriate.   Follows all commands speech and language fluent.   Cranial nerve II-XII: Pupils were equal round reactive to light extraocular movements were full, visual field were full on confrontational test. Facial sensation and strength were normal. hearing was intact to finger rubbing bilaterally. Uvula tongue midline. head turning and shoulder shrug were normal and symmetric.Tongue protrusion into cheek strength was normal. Motor: normal bulk and tone, full strength in the BUE, BLE, fine finger movements normal, no pronator drift. No focal weakness Sensory: normal and symmetric to light touch, in the upper and lower extremities Coordination: finger-nose-finger, heel-to-shin bilaterally, no dysmetria Reflexes: 1+ upper lower and symmetric plantar  responses were flexor bilaterally. Gait and Station: Rising up from seated position without assistance, normal stance,  moderate stride, good arm swing, smooth turning, able to perform tiptoe, and heel walking without difficulty. Tandem gait is mildly unsteady. No assistive device  DIAGNOSTIC DATA (LABS, IMAGING, TESTING) - I reviewed patient records, labs, notes, testing and imaging myself where available.  Lab Results  Component Value Date   WBC 9.7 08/18/2015   HGB 14.0 08/18/2015   HCT 42.4 08/18/2015   MCV 89.8 08/18/2015   PLT 312 08/18/2015      Component Value Date/Time   NA 139 05/27/2016 0908   K 4.3 05/27/2016 0908   CL 106 05/27/2016 0908   CO2 26 05/27/2016 0908   GLUCOSE 105 (H) 05/27/2016 0908   BUN 12 05/27/2016 0908   CREATININE 1.03 05/27/2016 0908   CALCIUM 9.4 05/27/2016 0908   PROT 6.4 05/27/2016 0908   ALBUMIN 4.1 05/27/2016 0908   AST 24 05/27/2016 0908   ALT 18 05/27/2016 0908   ALKPHOS 58 05/27/2016 0908   BILITOT 0.6 05/27/2016 0908   GFRNONAA 80  05/27/2016 0908   GFRAA >89 05/27/2016 0908   Lab Results  Component Value Date   CHOL 126 11/27/2015   HDL 53 11/27/2015   LDLCALC 60 11/27/2015   TRIG 65 11/27/2015   CHOLHDL 2.4 11/27/2015   Lab Results  Component Value Date   HGBA1C 5.1 11/27/2015    ASSESSMENT AND PLAN 56 y.o. Caucasian male with PMH of heavy smoker and heavy drinker was admitted on 75/1025 for left PLIC infarct but also has chronic left CR infarct on MRI. MRA showed left M1 cut off. Found to have HTN also. CUS, TTE, A1C not remarkable. LDL 133. He was put on ASA 325 and lipitor and discharged to CIR. During the interval time, he improved well, now almost recovered fully.  He continues smoking and Limited drink to 2 drinks per day, not quit yet.  He is currently on aspirin 81.   PLAN: Continue aspirin for secondary stroke prevention Continue Lipitor for hyperlipidemia labs followed by primary care. Last LDL 60 on 11/27/2015 Blood pressure well controlled at 117/72 continue medication  Continue to follow up with primary care regarding stroke risk factor modification No more than 2 alcoholic drinks per day Healthy diet, try to exercise May resume driving Will get sleep study for daytime drowsiness frequent waking and insomnia with history of stroke Follow-up in 6 months, if stable will discharge I spent 25 minutes in total face to face time with the patient more than 50% of which was spent counseling and coordination of care, reviewing test results reviewing medications and discussing and reviewing the diagnosis of stroke and risk factor management along with untreated sleep apnea and questions answered Dennie Bible, [facility], [facility], APRN  [facility] [address] [place], [place] [address] [phone]

## 2016-08-27 ENCOUNTER — Telehealth: Payer: Self-pay

## 2016-08-27 ENCOUNTER — Other Ambulatory Visit: Payer: Self-pay

## 2016-08-27 ENCOUNTER — Other Ambulatory Visit: Payer: Self-pay | Admitting: Family Medicine

## 2016-08-27 DIAGNOSIS — M1711 Unilateral primary osteoarthritis, right knee: Secondary | ICD-10-CM

## 2016-08-27 MED ORDER — ACETAMINOPHEN ER 650 MG PO TBCR
650.0000 mg | EXTENDED_RELEASE_TABLET | Freq: Three times a day (TID) | ORAL | 1 refills | Status: DC | PRN
Start: 2016-08-27 — End: 2016-08-27

## 2016-08-27 MED ORDER — ACETAMINOPHEN ER 650 MG PO TBCR: 650.0000 mg | EXTENDED_RELEASE_TABLET | Freq: Three times a day (TID) | ORAL | 1 refills | Status: AC | PRN

## 2016-08-27 NOTE — Telephone Encounter (Signed)
-----   Message from Dorena Dew, [place] sent at 08/27/2016 10:11 AM EDT ----- Regarding: lab results Please inform Michael Davenport of mild degenerative joint disease to right knee, which is consistent with osteoarthritis. Sent Tylenol arthritis 650 mg every 8 hours as needed. Will follow up in office as previously scheduled  Thanks   ----- Message ----- From: Interface, Rad Results In Sent: 08/26/2016   2:03 PM To: Dorena Dew, FNP

## 2016-08-27 NOTE — Telephone Encounter (Signed)
Patient returned call, I advised of xray showing degenerative joint disease in right knee which is consistent with osteoarthritis. Advised that we have prescribed Tylenol Arthritis 650mg  every 8 hours as needed and that this has been sent to pharmacy. Asked patient to keep next appointment and patient had no questions at this time. Thanks!

## 2016-08-27 NOTE — Telephone Encounter (Signed)
Called, no answer. Left message for patient to return call. Thanks!  

## 2016-09-06 ENCOUNTER — Other Ambulatory Visit: Payer: Self-pay

## 2016-09-06 DIAGNOSIS — M1711 Unilateral primary osteoarthritis, right knee: Secondary | ICD-10-CM

## 2016-09-06 MED ORDER — ATORVASTATIN CALCIUM 40 MG PO TABS: 40.0000 mg | ORAL_TABLET | Freq: Every day | ORAL | 1 refills | Status: DC

## 2016-09-06 NOTE — Telephone Encounter (Signed)
Refill for atorvastatin sent into pharmacy. Thanks!

## 2016-09-24 ENCOUNTER — Other Ambulatory Visit: Payer: Self-pay

## 2016-09-24 DIAGNOSIS — G629 Polyneuropathy, unspecified: Secondary | ICD-10-CM

## 2016-09-24 MED ORDER — GABAPENTIN 300 MG PO CAPS
300.0000 mg | ORAL_CAPSULE | Freq: Three times a day (TID) | ORAL | 0 refills | Status: DC
Start: 2016-09-24 — End: 2016-11-25

## 2016-09-24 NOTE — Telephone Encounter (Signed)
Patient was upset because his gabapentin was only sent in for 1 month at a time. He requested that this be sent in for 90 days supply. I have refilled this for 90 days supply with no additional fills. Thanks!

## 2016-09-30 ENCOUNTER — Institutional Professional Consult (permissible substitution): Payer: Medicaid Other | Admitting: Neurology

## 2016-10-14 ENCOUNTER — Institutional Professional Consult (permissible substitution): Payer: Medicaid Other | Admitting: Neurology

## 2016-10-28 ENCOUNTER — Ambulatory Visit: Payer: Self-pay | Admitting: Family Medicine

## 2016-11-25 ENCOUNTER — Encounter: Payer: Self-pay | Admitting: Family Medicine

## 2016-11-25 ENCOUNTER — Ambulatory Visit (INDEPENDENT_AMBULATORY_CARE_PROVIDER_SITE_OTHER): Payer: Medicaid Other | Admitting: Family Medicine

## 2016-11-25 VITALS — BP 136/78 | HR 78 | Temp 98.1°F | Resp 16 | Ht 66.0 in | Wt 177.0 lb

## 2016-11-25 DIAGNOSIS — F17200 Nicotine dependence, unspecified, uncomplicated: Secondary | ICD-10-CM

## 2016-11-25 DIAGNOSIS — G629 Polyneuropathy, unspecified: Secondary | ICD-10-CM | POA: Diagnosis not present

## 2016-11-25 DIAGNOSIS — Z23 Encounter for immunization: Secondary | ICD-10-CM

## 2016-11-25 DIAGNOSIS — E785 Hyperlipidemia, unspecified: Secondary | ICD-10-CM | POA: Diagnosis not present

## 2016-11-25 DIAGNOSIS — I10 Essential (primary) hypertension: Secondary | ICD-10-CM | POA: Diagnosis not present

## 2016-11-25 LAB — POCT URINALYSIS DIP (DEVICE)
Bilirubin Urine: NEGATIVE
Glucose, UA: NEGATIVE mg/dL
HGB URINE DIPSTICK: NEGATIVE
Ketones, ur: NEGATIVE mg/dL
Leukocytes, UA: NEGATIVE
NITRITE: NEGATIVE
PH: 6 (ref 5.0–8.0)
PROTEIN: NEGATIVE mg/dL
Specific Gravity, Urine: 1.015 (ref 1.005–1.030)
Urobilinogen, UA: 0.2 mg/dL (ref 0.0–1.0)

## 2016-11-25 MED ORDER — AMLODIPINE BESYLATE 5 MG PO TABS: 5.0000 mg | ORAL_TABLET | Freq: Every day | ORAL | 1 refills | Status: DC

## 2016-11-25 MED ORDER — LISINOPRIL 20 MG PO TABS: 20.0000 mg | ORAL_TABLET | Freq: Every day | ORAL | 5 refills | Status: DC

## 2016-11-25 MED ORDER — ATORVASTATIN CALCIUM 20 MG PO TABS: 20.0000 mg | ORAL_TABLET | Freq: Every day | ORAL | 1 refills | Status: AC

## 2016-11-25 MED ORDER — GABAPENTIN 300 MG PO CAPS: 300.0000 mg | ORAL_CAPSULE | Freq: Three times a day (TID) | ORAL | 1 refills | Status: AC

## 2016-11-25 NOTE — Progress Notes (Signed)
Chief Complaint  Patient presents with  . Joint Swelling    right ankle is worse than left   . Hypertension   Subjective:    Patient ID: Michael Davenport, male    DOB: [birth year], 58 y.o.   MRN: [id number]  HPI Michael Davenport, a 56 year old male with a history of hypertension, alcohol abuse and stroke presents for a 3 month follow up of chronic conditions. Michael Davenport is accompanied by his wife [name]. He is taking antihypertensive medications consistently and following a low sodium diet. [name] prepares most meals.  He is currently checking blood pressures at home, which have been within a normal range. He has been having increased swelling to lower extremities over the past several months. He says that lower extremity edema has not improved with elevating to heart level. .  Patient denies headache, dizziness,  chest pain, dyspnea, fatigue, irregular heart beat, near-syncope, palpitations, syncope and tachypnea.  Cardiovascular risk factors include: dyslipidemia, hypertension, male gender, sedentary lifestyle and smoking/ tobacco exposure.   Past Medical History:  Diagnosis Date  . ETOH abuse 01/23/2015  . HTN (hypertension)   . Stroke [facility])    Social History   Social History  . Marital status: Single    Spouse name: N/A  . Number of children: 5  . Years of education: 9   Occupational History  .      unemployed   Social History Main Topics  . Smoking status: Current Every Day Smoker    Packs/day: 0.50    Types: Cigarettes  . Smokeless tobacco: Never Used     Comment: smokes 10 cigarettes a day  . Alcohol use 8.4 oz/week    14 Cans of beer per week     Comment: 08/26/16 pt states doesn't know how much daily  . Drug use: No  . Sexual activity: Not on file   Other Topics Concern  . Not on file   Social History Narrative   Lives with friend   Caffeine- coffee, 3 cups daily   Immunization History  Administered Date(s) Administered  . Influenza,inj,Quad PF,6+ Mos 01/20/2015,  11/27/2015  . Tdap 11/27/2015  No Known Allergies Review of Systems  Constitutional: Negative for fatigue and unexpected weight change.  Eyes: Negative.  Negative for visual disturbance.  Respiratory: Negative.  Negative for shortness of breath and wheezing.   Cardiovascular: Positive for leg swelling. Negative for chest pain and palpitations.  Gastrointestinal: Negative.   Endocrine: Negative.  Negative for polydipsia, polyphagia and polyuria.  Genitourinary: Negative.  Negative for hematuria.  Skin: Negative.   Allergic/Immunologic: Negative.   Neurological: Negative for dizziness, tremors, syncope, facial asymmetry, light-headedness and headaches.  Hematological: Negative.   Psychiatric/Behavioral: Negative.      Objective:   Physical Exam  Constitutional: He is oriented to person, place, and time. He appears well-developed and well-nourished.  HENT:  Head: Normocephalic and atraumatic.  Right Ear: External ear normal.  Left Ear: External ear normal.  Nose: Nose normal.  Mouth/Throat: Oropharynx is clear and moist.  Eyes: Pupils are equal, round, and reactive to light. Conjunctivae and EOM are normal.  Neck: Normal range of motion. Neck supple.  Cardiovascular: Normal rate, regular rhythm, normal heart sounds and intact distal pulses.   Trace edema to lower extremity edema.   Pulmonary/Chest: Effort normal and breath sounds normal.  Abdominal: Soft. Bowel sounds are normal.  Musculoskeletal:       Lumbar back: He exhibits no tenderness and no spasm.  Trace edema  to right ankle  Neurological: He is alert and oriented to person, place, and time. He has normal reflexes.  Skin: Skin is warm and dry.  Psychiatric: He has a normal mood and affect. His behavior is normal. Judgment and thought content normal.   BP 136/78 (BP Location: Right Arm, Patient Position: Sitting, Cuff Size: Large) Comment: manually  Pulse 78   Temp 98.1 F (36.7 C) (Oral)   Resp 16   Ht 5\' 6"  (1.676  m)   Wt 177 lb (80.3 kg)   SpO2 100%   BMI 28.57 kg/m     Assessment & Plan:  1. Essential hypertension Will decrease amlodipine to 5 mg due to lower extremity edema.  Continue to elevate lower extremities to heart level while at rest Increase lisinopril to 20 mg daily Reviewed urinalysis, no proteinuria present  - lisinopril (PRINIVIL,ZESTRIL) 20 MG tablet; Take 1 tablet (20 mg total) by mouth daily with supper.  Dispense: 30 tablet; Refill: 5 - amLODipine (NORVASC) 5 MG tablet; Take 1 tablet (5 mg total) by mouth daily.  Dispense: 90 tablet; Refill: 1 - BASIC METABOLIC PANEL WITH GFR  2. Neuropathy - gabapentin (NEURONTIN) 300 MG capsule; Take 1 capsule (300 mg total) by mouth 3 (three) times daily.  Dispense: 270 capsule; Refill: 1  3. Dyslipidemia - atorvastatin (LIPITOR) 20 MG tablet; Take 1 tablet (20 mg total) by mouth daily.  Dispense: 90 tablet; Refill: 1  4. Influenza vaccination given - Flu Vaccine QUAD 6+ mos PF IM (Fluarix Quad PF)  5. Tobacco use disorder Smoking cessation instruction/counseling given:  counseled patient on the dangers of tobacco use, advised patient to stop smoking, and reviewed strategies to maximize success   RTC: 1 month for bp check and 3 months for hypertension   Donia Pounds  MSN, FNP-C [facility] [address] [place], [place] [address] [phone]

## 2016-11-25 NOTE — Patient Instructions (Addendum)
Will decrease Amlodipine 5 mg daily Will increase Lisinopril to 20 mg daily Continue to work on smoking cessation  Cholesterol has improved, will decrease Lipitor to 20 mg, cut current dosage in half.    All medication has been sent to pharmacy  Will follow up by phone with any abnormal laboratory results   [place] stands for "Dietary Approaches to Stop Hypertension." The DASH eating plan is a healthy eating plan that has been shown to reduce high blood pressure (hypertension). It may also reduce your risk for type 2 diabetes, heart disease, and stroke. The DASH eating plan may also help with weight loss. What are tips for following this plan? General guidelines  Avoid eating more than 2,300 mg (milligrams) of salt (sodium) a day. If you have hypertension, you may need to reduce your sodium intake to 1,500 mg a day.  Limit alcohol intake to no more than 1 drink a day for nonpregnant women and 2 drinks a day for men. One drink equals 12 oz of beer, 5 oz of wine, or 1 oz of hard liquor.  Work with your health care provider to maintain a healthy body weight or to lose weight. Ask what an ideal weight is for you.  Get at least 30 minutes of exercise that causes your heart to beat faster (aerobic exercise) most days of the week. Activities may include walking, swimming, or biking.  Work with your health care provider or diet and nutrition specialist (dietitian) to adjust your eating plan to your individual calorie needs. Reading food labels  Check food labels for the amount of sodium per serving. Choose foods with less than 5 percent of the Daily Value of sodium. Generally, foods with less than 300 mg of sodium per serving fit into this eating plan.  To find whole grains, look for the word "whole" as the first word in the ingredient list. Shopping  Buy products labeled as "low-sodium" or "no salt added."  Buy fresh foods. Avoid canned foods and premade or frozen  meals. Cooking  Avoid adding salt when cooking. Use salt-free seasonings or herbs instead of table salt or sea salt. Check with your health care provider or pharmacist before using salt substitutes.  Do not fry foods. Cook foods using healthy methods such as baking, boiling, grilling, and broiling instead.  Cook with heart-healthy oils, such as olive, canola, soybean, or sunflower oil. Meal planning   Eat a balanced diet that includes: ? 5 or more servings of fruits and vegetables each day. At each meal, try to fill half of your plate with fruits and vegetables. ? Up to 6-8 servings of whole grains each day. ? Less than 6 oz of lean meat, poultry, or fish each day. A 3-oz serving of meat is about the same size as a deck of cards. One egg equals 1 oz. ? 2 servings of low-fat dairy each day. ? A serving of nuts, seeds, or beans 5 times each week. ? Heart-healthy fats. Healthy fats called Omega-3 fatty acids are found in foods such as flaxseeds and coldwater fish, like sardines, salmon, and mackerel.  Limit how much you eat of the following: ? Canned or prepackaged foods. ? Food that is high in trans fat, such as fried foods. ? Food that is high in saturated fat, such as fatty meat. ? Sweets, desserts, sugary drinks, and other foods with added sugar. ? Full-fat dairy products.  Do not salt foods before eating.  Try to eat at  least 2 vegetarian meals each week.  Eat more home-cooked food and less restaurant, buffet, and fast food.  When eating at a restaurant, ask that your food be prepared with less salt or no salt, if possible. What foods are recommended? The items listed may not be a complete list. Talk with your dietitian about what dietary choices are best for you. Grains Whole-grain or whole-wheat bread. Whole-grain or whole-wheat pasta. Brown rice. [place]. Bulgur. Whole-grain and low-sodium cereals. Pita bread. Low-fat, low-sodium crackers. Whole-wheat flour  tortillas. Vegetables Fresh or frozen vegetables (raw, steamed, roasted, or grilled). Low-sodium or reduced-sodium tomato and vegetable juice. Low-sodium or reduced-sodium tomato sauce and tomato paste. Low-sodium or reduced-sodium canned vegetables. Fruits All fresh, dried, or frozen fruit. Canned fruit in natural juice (without added sugar). Meat and other protein foods Skinless chicken or [place]. Ground chicken or [place]. Pork with fat trimmed off. Fish and seafood. Egg whites. Dried beans, peas, or lentils. Unsalted nuts, nut butters, and seeds. Unsalted canned beans. Lean cuts of beef with fat trimmed off. Low-sodium, lean deli meat. Dairy Low-fat (1%) or fat-free (skim) milk. Fat-free, low-fat, or reduced-fat cheeses. Nonfat, low-sodium ricotta or cottage cheese. Low-fat or nonfat yogurt. Low-fat, low-sodium cheese. Fats and oils Soft margarine without trans fats. Vegetable oil. Low-fat, reduced-fat, or light mayonnaise and salad dressings (reduced-sodium). Canola, safflower, olive, soybean, and sunflower oils. Avocado. Seasoning and other foods Herbs. Spices. Seasoning mixes without salt. Unsalted popcorn and pretzels. Fat-free sweets. What foods are not recommended? The items listed may not be a complete list. Talk with your dietitian about what dietary choices are best for you. Grains Baked goods made with fat, such as croissants, muffins, or some breads. Dry pasta or rice meal packs. Vegetables Creamed or fried vegetables. Vegetables in a cheese sauce. Regular canned vegetables (not low-sodium or reduced-sodium). Regular canned tomato sauce and paste (not low-sodium or reduced-sodium). Regular tomato and vegetable juice (not low-sodium or reduced-sodium). Angie Fava. Olives. Fruits Canned fruit in a light or heavy syrup. Fried fruit. Fruit in cream or butter sauce. Meat and other protein foods Fatty cuts of meat. Ribs. Fried meat. Berniece Salines. Sausage. Bologna and other processed lunch meats.  Salami. Fatback. Hotdogs. Bratwurst. Salted nuts and seeds. Canned beans with added salt. Canned or smoked fish. Whole eggs or egg yolks. Chicken or [place] with skin. Dairy Whole or 2% milk, cream, and half-and-half. Whole or full-fat cream cheese. Whole-fat or sweetened yogurt. Full-fat cheese. Nondairy creamers. Whipped toppings. Processed cheese and cheese spreads. Fats and oils Butter. Stick margarine. Lard. Shortening. Ghee. Bacon fat. Tropical oils, such as coconut, palm kernel, or palm oil. Seasoning and other foods Salted popcorn and pretzels. Onion salt, garlic salt, seasoned salt, table salt, and sea salt. Worcestershire sauce. Tartar sauce. Barbecue sauce. Teriyaki sauce. Soy sauce, including reduced-sodium. Steak sauce. Canned and packaged gravies. Fish sauce. Oyster sauce. Cocktail sauce. Horseradish that you find on the shelf. Ketchup. Mustard. Meat flavorings and tenderizers. Bouillon cubes. Hot sauce and Tabasco sauce. Premade or packaged marinades. Premade or packaged taco seasonings. Relishes. Regular salad dressings. Where to find more information:  National Heart, Lung, and [place]: https://wilson-eaton.com/  American Heart Association: www.heart.org Summary  The DASH eating plan is a healthy eating plan that has been shown to reduce high blood pressure (hypertension). It may also reduce your risk for type 2 diabetes, heart disease, and stroke.  With the DASH eating plan, you should limit salt (sodium) intake to 2,300 mg a day. If you have hypertension, you  may need to reduce your sodium intake to 1,500 mg a day.  When on the DASH eating plan, aim to eat more fresh fruits and vegetables, whole grains, lean proteins, low-fat dairy, and heart-healthy fats.  Work with your health care provider or diet and nutrition specialist (dietitian) to adjust your eating plan to your individual calorie needs. This information is not intended to replace advice given to you by your health  care provider. Make sure you discuss any questions you have with your health care provider. Document Released: 01/10/2011 Document Revised: 01/15/2016 Document Reviewed: 01/15/2016 Elsevier Interactive Patient Education  2017 [employer].

## 2016-11-26 LAB — BASIC METABOLIC PANEL WITH GFR
BUN: 12 mg/dL (ref 7–25)
CALCIUM: 9.6 mg/dL (ref 8.6–10.3)
CO2: 24 mmol/L (ref 20–32)
CREATININE: 1 mg/dL (ref 0.70–1.33)
Chloride: 104 mmol/L (ref 98–110)
GFR, EST AFRICAN AMERICAN: 96 mL/min/{1.73_m2} (ref >=60)
GFR, EST NON AFRICAN AMERICAN: 83 mL/min/{1.73_m2} (ref >=60)
Glucose, Bld: 92 mg/dL (ref 65–99)
Potassium: 4.2 mmol/L (ref 3.5–5.3)
Sodium: 139 mmol/L (ref 135–146)

## 2016-11-26 LAB — SPECIMEN COMPROMISED

## 2016-12-09 IMAGING — MR MR MRA HEAD W/O CM
9 of 13 series · 30 of 48 positions shown · non-contrast
Comparison: CT head 01/16/2015.

CLINICAL DATA: RIGHT-sided weakness which began 01/15/2015. History
of hypertension with noncompliance. History of smoking. History of
alcohol abuse.

EXAM:
MRI HEAD WITHOUT CONTRAST
MRA HEAD WITHOUT CONTRAST
TECHNIQUE: Multiplanar, multiecho pulse sequences of the brain and surrounding
structures were obtained without intravenous contrast. Angiographic
images of the head were obtained using MRA technique without
contrast.

[Series 3: T1 · sagittal · 5.0mm · 0.47mm/px · 1 of 25 slices shown]
[im 1/25]
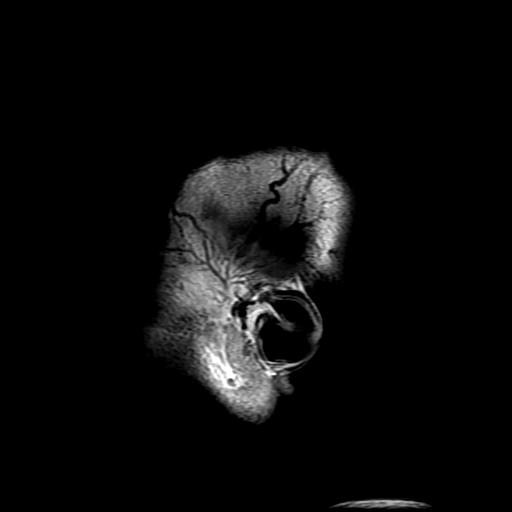

[Series 4: DWI · axial · 3.0mm · 1.09mm/px · z∈[-62,+66]mm · 6 of 88 slices shown (1 of 4)]
[im 1/88]
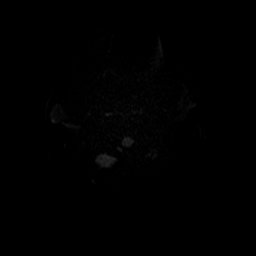
[im 18/88]
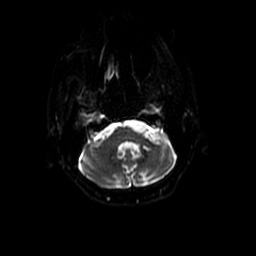
[im 35/88]
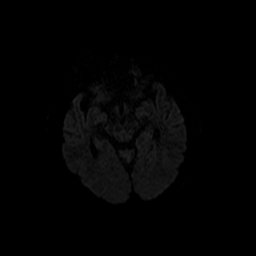
[im 53/88]
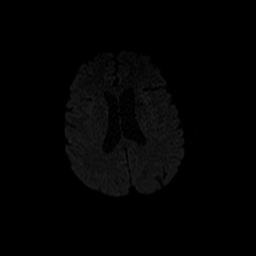
[im 70/88]
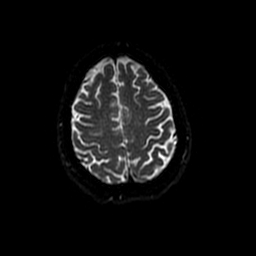
[im 88/88]
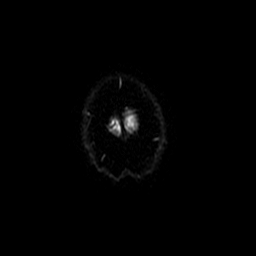

[Series 5: (id) mt fs · axial · 1.4mm · 0.43mm/px · z∈[-59,-2]mm · 5 of 136 slices shown]
[im 1/136]
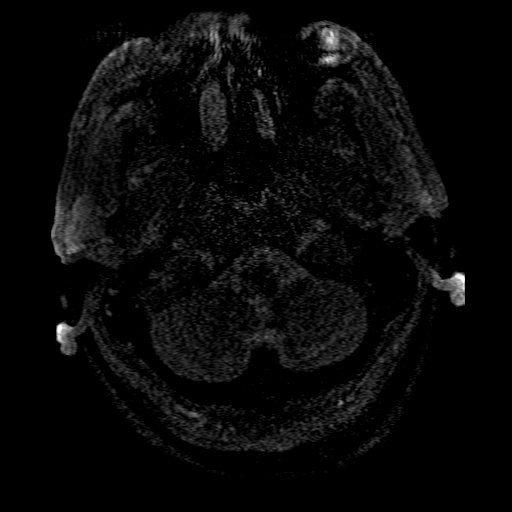
[im 28/136]
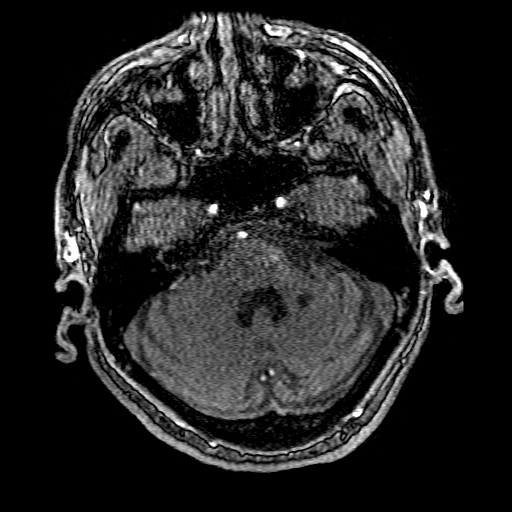
[im 41/136]
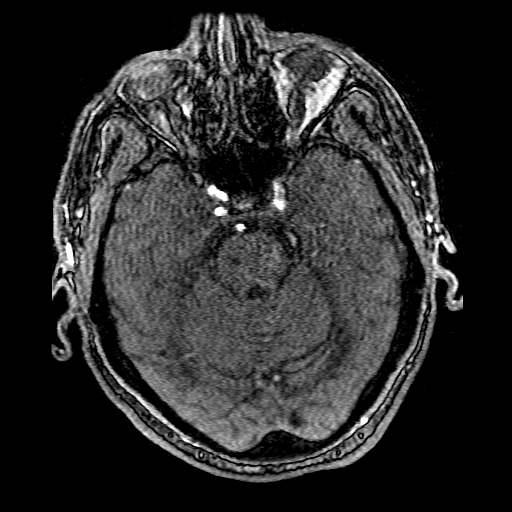
[im 55/136]
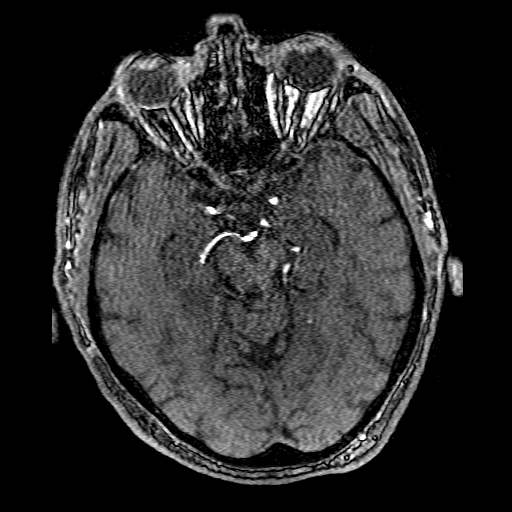
[im 82/136]
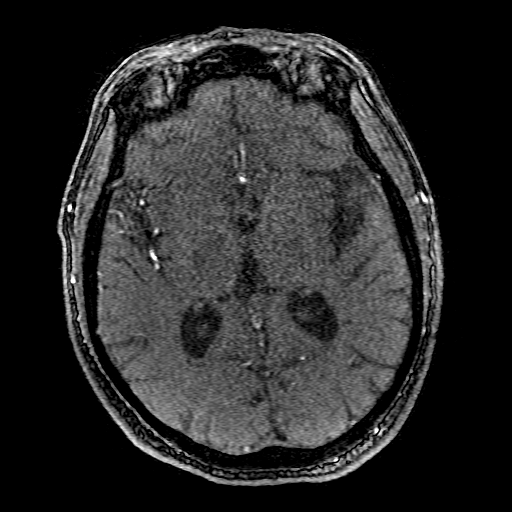

[Series 6: T2 · axial · 5.0mm · 0.45mm/px · z∈[-65,+72]mm · 2 of 24 slices shown (1 of 2)]
[im 1/24]
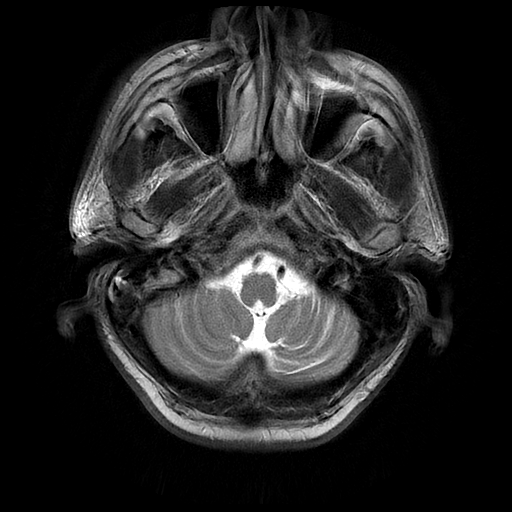
[im 24/24]
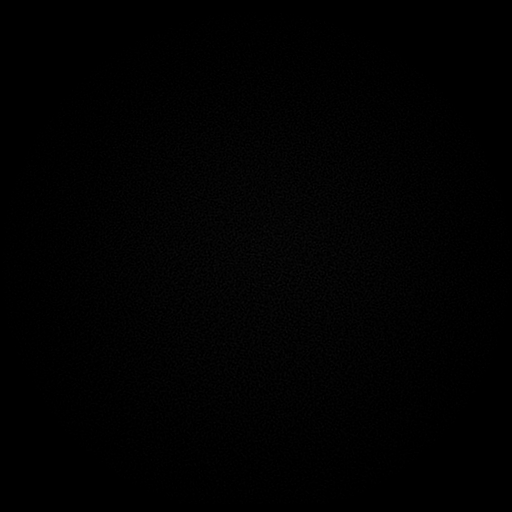

[Series 7: FLAIR · axial · 5.0mm · 0.45mm/px · z∈[-65,+72]mm · 2 of 24 slices shown]
[im 1/24]
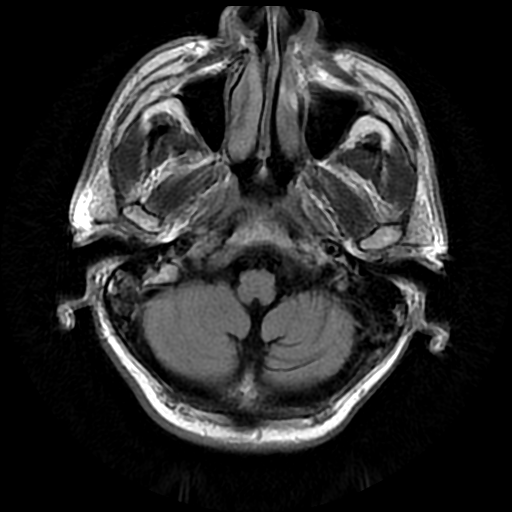
[im 24/24]
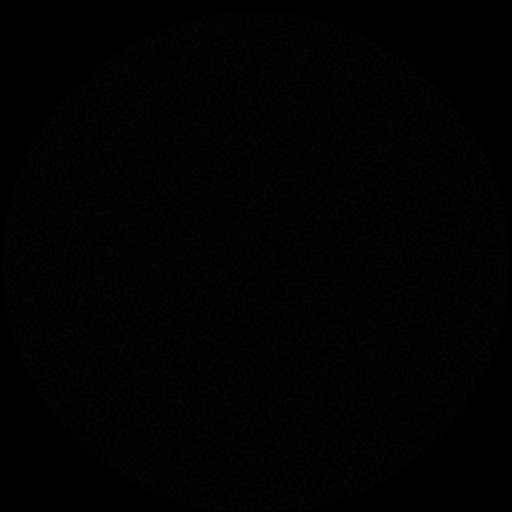

[Series 8: DWI · coronal · 5.0mm · 1.09mm/px · 5 of 62 slices shown (2 of 4)]
[im 1/62]
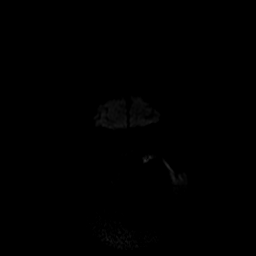
[im 16/62]
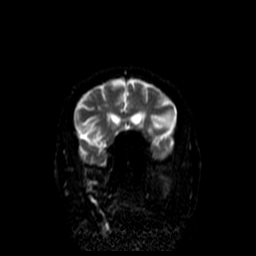
[im 31/62]
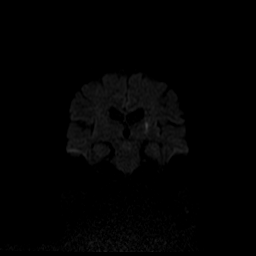
[im 46/62]
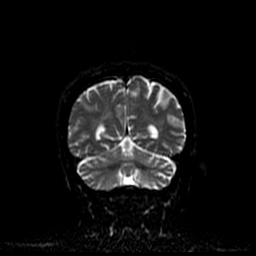
[im 62/62]
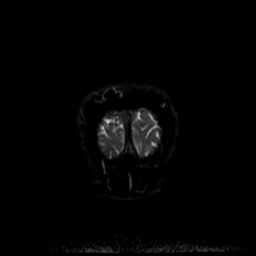

[Series 11: T2 · coronal · 5.0mm · 0.39mm/px · 2 of 25 slices shown (2 of 2)]
[im 1/25]
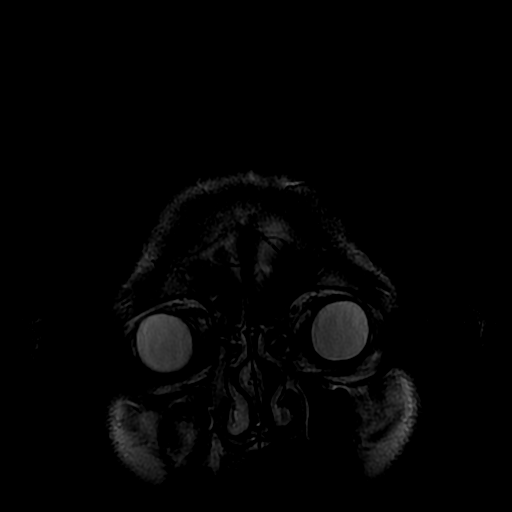
[im 25/25]
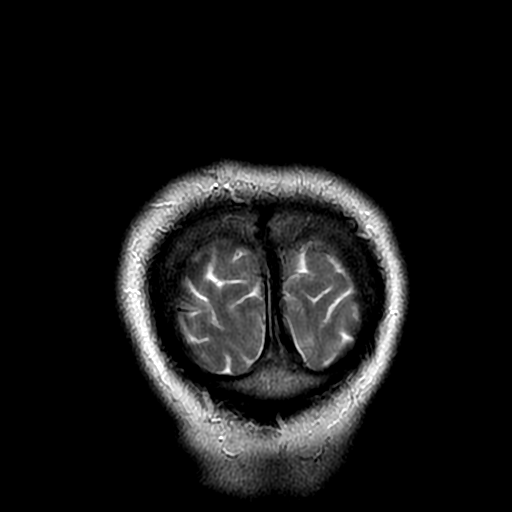

[Series 400: DWI · axial · 3.0mm · 1.09mm/px · z∈[-62,+66]mm · 4 of 44 slices shown (3 of 4)]
[im 1/44]
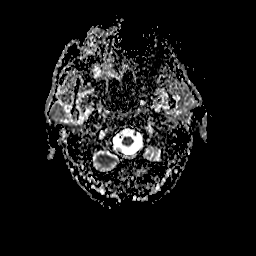
[im 15/44]
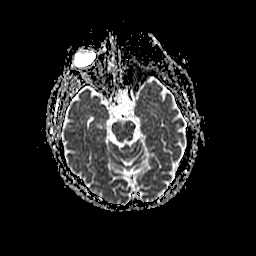
[im 29/44]
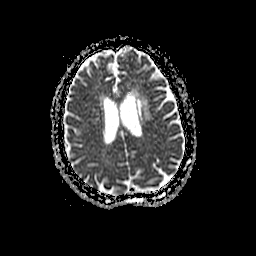
[im 44/44]
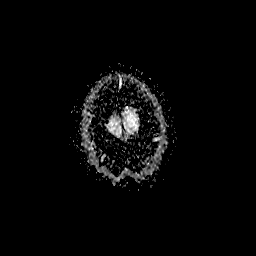

[Series 800: DWI · coronal · 5.0mm · 1.09mm/px · 3 of 31 slices shown (4 of 4)]
[im 1/31]
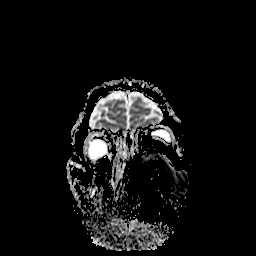
[im 16/31]
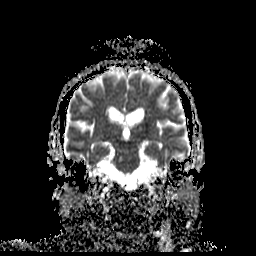
[im 31/31]
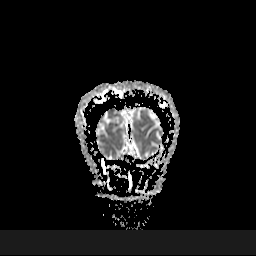

[30 of 48 positions shown; findings below may reference images not displayed]

FINDINGS: The patient was unable to remain motionless for the exam. Small or
subtle lesions could be overlooked.

MRI HEAD FINDINGS

There is a moderate-sized area of restricted diffusion affecting the
posterior lentiform nucleus, internal capsule, and periventricular
white matter on the LEFT consistent with an acute lenticulostriate
infarct. No similar findings elsewhere.

No hemorrhage, mass lesion, hydrocephalus, or extra-axial fluid.

Premature for age atrophy. Chronic microvascular ischemic change
affects the periventricular and subcortical white matter. Scattered
areas of remote lacunar infarction involve the deep nuclei on the
LEFT and the subcortical white matter, as well as the LEFT middle
cerebellar peduncle.

No midline abnormality. Flow voids are maintained in the internal
carotid, basilar, and vertebral arteries. Absent flow related
enhancement LEFT M1 MCA. Extracranial soft tissues are unremarkable.

MRA HEAD FINDINGS

The RIGHT internal carotid artery is widely patent in its cervical,
petrous, cavernous, and supraclinoid segments.

The LEFT ICA is widely patent in its cervical, petrous, and
supraclinoid segments. 50% stenosis of the superior cavernous
segment LICA.

Basilar artery demonstrates focal stenosis proximally, estimated
50%. Both vertebrals contribute to formation of basilar, with mild
irregularity on the RIGHT. Moderate stenosis on the LEFT distal most
V4 vertebral segment estimated 50%.

Moderately irregular RIGHT M1 MCA. Significant stenosis proximal to
the bifurcation estimated at 75%. Proximal M2 outpouching suspected
to represent a berry aneurysm, estimated 3 mm size, as seen on image
74 series 5. The study is too severely degraded from motion
standpoint to confidently confirm, but CTA head recommended for
further evaluation. Moderately diseased RIGHT A1 ACA throughout its
course, with poor flow related enhancement in the RIGHT A2 and A3
segments.

LEFT ICA terminates in robust A1 ACA which gives rise to a normal
sized LEFT anterior cerebral artery distally.

There is no flow related enhancement in the RIGHT M1 MCA nor is
there significant reconstitution in its distal ramifications. Given
the acute LEFT MCA lenticulostriate territory infarct, the LEFT MCA
occlusion is likely acute.

Mildly irregular LEFT P1 PCA. Moderately irregular RIGHT P1 PCA with
severe stenosis in its distal ambient segment on the RIGHT.

Severely diseased BILATERAL superior cerebellar arteries. Poorly
visualized anterior inferior and posterior inferior cerebellar
arteries.
IMPRESSION: Acute LEFT MCA lenticulostriate territory infarct without
hemorrhage.

LEFT M1 MCA large vessel occlusion. No significant recanalization of
the LEFT M2 or M3 branches.

Widespread intracranial atherosclerotic change as described, with
potentially flow reducing lesions affecting the distal RIGHT MCA,
proximal RIGHT ACA, distal RIGHT PCA, and tandem non stenotic
lesions affecting the LEFT vertebral and proximal basilar.

Suspected 3 mm RIGHT MCA bifurcation berry aneurysm. Consider
further assessment with CTA head due to motion degraded series.

## 2016-12-12 ENCOUNTER — Telehealth: Payer: Self-pay | Admitting: Neurology

## 2016-12-12 NOTE — Telephone Encounter (Signed)
Pt called he is applying for social security disability. Pt said he needs to know what his dx is. Please call to discuss

## 2016-12-12 NOTE — Telephone Encounter (Signed)
Rn spoke with Michael Davenport about a diagnosis for his social security disability form. Rn gave patient the diagnosis of  Cerebrovascular accident (CVA) due to occlusion of left middle cerebral artery ([place]). The patient gave the phone to [facility] on his dpr. Rn gave [name] the diagnosis. She proceeded to ask about the plan, and expectations of the patient. Rn stated pt was seen 2 times in the last year at our office. She can request office notes, and discuss It with the patient. RN stated pt will still need to fill out a release of information form to get all medical records at [facility]. She verbalized understanding.

## 2016-12-23 ENCOUNTER — Ambulatory Visit (INDEPENDENT_AMBULATORY_CARE_PROVIDER_SITE_OTHER): Payer: Medicaid Other | Admitting: Family Medicine

## 2016-12-23 ENCOUNTER — Encounter: Payer: Self-pay | Admitting: Family Medicine

## 2016-12-23 VITALS — BP 132/76 | HR 75 | Temp 98.5°F | Resp 16 | Ht 66.0 in | Wt 181.0 lb

## 2016-12-23 DIAGNOSIS — I10 Essential (primary) hypertension: Secondary | ICD-10-CM | POA: Diagnosis not present

## 2016-12-23 DIAGNOSIS — Z720 Tobacco use: Secondary | ICD-10-CM | POA: Diagnosis not present

## 2016-12-23 DIAGNOSIS — F32A Depression, unspecified: Secondary | ICD-10-CM

## 2016-12-23 DIAGNOSIS — F329 Major depressive disorder, single episode, unspecified: Secondary | ICD-10-CM | POA: Diagnosis not present

## 2016-12-23 DIAGNOSIS — G4700 Insomnia, unspecified: Secondary | ICD-10-CM

## 2016-12-23 MED ORDER — DULOXETINE HCL 20 MG PO CPEP: 20.0000 mg | ORAL_CAPSULE | Freq: Every day | ORAL | 1 refills | Status: AC

## 2016-12-23 MED ORDER — TRAZODONE HCL 50 MG PO TABS: 50.0000 mg | ORAL_TABLET | Freq: Every evening | ORAL | 3 refills | Status: DC | PRN

## 2016-12-23 MED ORDER — LISINOPRIL 20 MG PO TABS: 20.0000 mg | ORAL_TABLET | Freq: Every day | ORAL | 5 refills | Status: DC

## 2016-12-23 MED ORDER — LISINOPRIL 20 MG PO TABS: 20.0000 mg | ORAL_TABLET | Freq: Every day | ORAL | 1 refills | Status: AC

## 2016-12-23 MED ORDER — AMLODIPINE BESYLATE 5 MG PO TABS: 5.0000 mg | ORAL_TABLET | Freq: Every day | ORAL | 1 refills | Status: AC

## 2016-12-23 NOTE — Patient Instructions (Signed)
Your blood pressure is at goal on today.  No medication changes warranted today.  I have sent in refills electronically to your pharmacy.  If there are any questions please have the pharmacy call the office. We will restart duloxetine 20 mg daily with breakfast.  We will follow-up in 3 months for hypertension.   We will continue trazodone 25 mg at bedtime.  Refrain from drinking alcohol, driving, or operating machinery while taking this medication.   Practice good sleep hygiene.  Stick to a sleep schedule, even on weekends. Exercise is great, but not too late in the day Avoid alcoholic drinks before bed Avoid large meals and beverages late before bed Don't take naps after 3 pm. Keep power naps less than 1 hour.  Relax before bed.  Take a hot bath before bed.  Have a good sleeping environment. Get rid of anything in your bedroom that might distract you from sleep.  Adopt good sleeping posture.   DASH Eating Plan DASH stands for "Dietary Approaches to Stop Hypertension." The DASH eating plan is a healthy eating plan that has been shown to reduce high blood pressure (hypertension). It may also reduce your risk for type 2 diabetes, heart disease, and stroke. The DASH eating plan may also help with weight loss. What are tips for following this plan? General guidelines  Avoid eating more than 2,300 mg (milligrams) of salt (sodium) a day. If you have hypertension, you may need to reduce your sodium intake to 1,500 mg a day.  Limit alcohol intake to no more than 1 drink a day for nonpregnant women and 2 drinks a day for men. One drink equals 12 oz of beer, 5 oz of wine, or 1 oz of hard liquor.  Work with your health care provider to maintain a healthy body weight or to lose weight. Ask what an ideal weight is for you.  Get at least 30 minutes of exercise that causes your heart to beat faster (aerobic exercise) most days of the week. Activities may include walking, swimming, or biking.  Work  with your health care provider or diet and nutrition specialist (dietitian) to adjust your eating plan to your individual calorie needs. Reading food labels  Check food labels for the amount of sodium per serving. Choose foods with less than 5 percent of the Daily Value of sodium. Generally, foods with less than 300 mg of sodium per serving fit into this eating plan.  To find whole grains, look for the word "whole" as the first word in the ingredient list. Shopping  Buy products labeled as "low-sodium" or "no salt added."  Buy fresh foods. Avoid canned foods and premade or frozen meals. Cooking  Avoid adding salt when cooking. Use salt-free seasonings or herbs instead of table salt or sea salt. Check with your health care provider or pharmacist before using salt substitutes.  Do not fry foods. Cook foods using healthy methods such as baking, boiling, grilling, and broiling instead.  Cook with heart-healthy oils, such as olive, canola, soybean, or sunflower oil. Meal planning   Eat a balanced diet that includes: ? 5 or more servings of fruits and vegetables each day. At each meal, try to fill half of your plate with fruits and vegetables. ? Up to 6-8 servings of whole grains each day. ? Less than 6 oz of lean meat, poultry, or fish each day. A 3-oz serving of meat is about the same size as a deck of cards. One egg equals 1 oz. ?  2 servings of low-fat dairy each day. ? A serving of nuts, seeds, or beans 5 times each week. ? Heart-healthy fats. Healthy fats called Omega-3 fatty acids are found in foods such as flaxseeds and coldwater fish, like sardines, salmon, and mackerel.  Limit how much you eat of the following: ? Canned or prepackaged foods. ? Food that is high in trans fat, such as fried foods. ? Food that is high in saturated fat, such as fatty meat. ? Sweets, desserts, sugary drinks, and other foods with added sugar. ? Full-fat dairy products.  Do not salt foods before  eating.  Try to eat at least 2 vegetarian meals each week.  Eat more home-cooked food and less restaurant, buffet, and fast food.  When eating at a restaurant, ask that your food be prepared with less salt or no salt, if possible. What foods are recommended? The items listed may not be a complete list. Talk with your dietitian about what dietary choices are best for you. Grains Whole-grain or whole-wheat bread. Whole-grain or whole-wheat pasta. Brown rice. [place]. Bulgur. Whole-grain and low-sodium cereals. Pita bread. Low-fat, low-sodium crackers. Whole-wheat flour tortillas. Vegetables Fresh or frozen vegetables (raw, steamed, roasted, or grilled). Low-sodium or reduced-sodium tomato and vegetable juice. Low-sodium or reduced-sodium tomato sauce and tomato paste. Low-sodium or reduced-sodium canned vegetables. Fruits All fresh, dried, or frozen fruit. Canned fruit in natural juice (without added sugar). Meat and other protein foods Skinless chicken or [place]. Ground chicken or [place]. Pork with fat trimmed off. Fish and seafood. Egg whites. Dried beans, peas, or lentils. Unsalted nuts, nut butters, and seeds. Unsalted canned beans. Lean cuts of beef with fat trimmed off. Low-sodium, lean deli meat. Dairy Low-fat (1%) or fat-free (skim) milk. Fat-free, low-fat, or reduced-fat cheeses. Nonfat, low-sodium ricotta or cottage cheese. Low-fat or nonfat yogurt. Low-fat, low-sodium cheese. Fats and oils Soft margarine without trans fats. Vegetable oil. Low-fat, reduced-fat, or light mayonnaise and salad dressings (reduced-sodium). Canola, safflower, olive, soybean, and sunflower oils. Avocado. Seasoning and other foods Herbs. Spices. Seasoning mixes without salt. Unsalted popcorn and pretzels. Fat-free sweets. What foods are not recommended? The items listed may not be a complete list. Talk with your dietitian about what dietary choices are best for you. Grains Baked goods made with fat,  such as croissants, muffins, or some breads. Dry pasta or rice meal packs. Vegetables Creamed or fried vegetables. Vegetables in a cheese sauce. Regular canned vegetables (not low-sodium or reduced-sodium). Regular canned tomato sauce and paste (not low-sodium or reduced-sodium). Regular tomato and vegetable juice (not low-sodium or reduced-sodium). Angie Fava. Olives. Fruits Canned fruit in a light or heavy syrup. Fried fruit. Fruit in cream or butter sauce. Meat and other protein foods Fatty cuts of meat. Ribs. Fried meat. Berniece Salines. Sausage. Bologna and other processed lunch meats. Salami. Fatback. Hotdogs. Bratwurst. Salted nuts and seeds. Canned beans with added salt. Canned or smoked fish. Whole eggs or egg yolks. Chicken or [place] with skin. Dairy Whole or 2% milk, cream, and half-and-half. Whole or full-fat cream cheese. Whole-fat or sweetened yogurt. Full-fat cheese. Nondairy creamers. Whipped toppings. Processed cheese and cheese spreads. Fats and oils Butter. Stick margarine. Lard. Shortening. Ghee. Bacon fat. Tropical oils, such as coconut, palm kernel, or palm oil. Seasoning and other foods Salted popcorn and pretzels. Onion salt, garlic salt, seasoned salt, table salt, and sea salt. Worcestershire sauce. Tartar sauce. Barbecue sauce. Teriyaki sauce. Soy sauce, including reduced-sodium. Steak sauce. Canned and packaged gravies. Fish sauce. Oyster sauce. Cocktail sauce.  Horseradish that you find on the shelf. Ketchup. Mustard. Meat flavorings and tenderizers. Bouillon cubes. Hot sauce and Tabasco sauce. Premade or packaged marinades. Premade or packaged taco seasonings. Relishes. Regular salad dressings. Where to find more information:  National Heart, Lung, and [place]: https://wilson-eaton.com/  American Heart Association: www.heart.org Summary  The DASH eating plan is a healthy eating plan that has been shown to reduce high blood pressure (hypertension). It may also reduce your risk for  type 2 diabetes, heart disease, and stroke.  With the DASH eating plan, you should limit salt (sodium) intake to 2,300 mg a day. If you have hypertension, you may need to reduce your sodium intake to 1,500 mg a day.  When on the DASH eating plan, aim to eat more fresh fruits and vegetables, whole grains, lean proteins, low-fat dairy, and heart-healthy fats.  Work with your health care provider or diet and nutrition specialist (dietitian) to adjust your eating plan to your individual calorie needs. This information is not intended to replace advice given to you by your health care provider. Make sure you discuss any questions you have with your health care provider. Document Released: 01/10/2011 Document Revised: 01/15/2016 Document Reviewed: 01/15/2016 Elsevier Interactive Patient Education  2017 [employer].

## 2016-12-23 NOTE — Progress Notes (Signed)
Subjective:  Michael Davenport, a 56 year old male with a history of hypertension, depression, and insomnia presents for follow-up of chronic conditions.  Patient states that he has been taking all medications consistently.  He is not exercising or following a low-sodium, low-fat diet.  He does not check blood pressures at home Patient denies: chest pain, chest pressure/discomfort, dyspnea, fatigue, lower extremity edema, orthopnea, palpitations, syncope and tachypnea. Cardiovascular risk factors include: sedentary lifestyle and smoking/ tobacco exposure.    Patient complains of depression.  Patient states that he has been taking Cymbalta inconsistently over the past several months. He complains of anhedonia and depressed mood.He denies current suicidal and homicidal plan or intent.   Past Medical History:  Diagnosis Date  . ETOH abuse 01/23/2015  . HTN (hypertension)   . Stroke [facility])    Social History   Socioeconomic History  . Marital status: Single    Spouse name: Not on file  . Number of children: 5  . Years of education: 9  . Highest education level: Not on file  Social Needs  . Financial resource strain: Not on file  . Food insecurity - worry: Not on file  . Food insecurity - inability: Not on file  . Transportation needs - medical: Not on file  . Transportation needs - non-medical: Not on file  Occupational History    Comment: unemployed  Tobacco Use  . Smoking status: Current Every Day Smoker    Packs/day: 0.50    Types: Cigarettes  . Smokeless tobacco: Never Used  . Tobacco comment: smokes 10 cigarettes a day  Substance and Sexual Activity  . Alcohol use: Yes    Alcohol/week: 8.4 oz    Types: 14 Cans of beer per week    Comment: 08/26/16 pt states doesn't know how much daily  . Drug use: No  . Sexual activity: Not on file  Other Topics Concern  . Not on file  Social History Narrative   Lives with friend   Caffeine- coffee, 3 cups daily   Immunization History   Administered Date(s) Administered  . Influenza,inj,Quad PF,6+ Mos 01/20/2015, 11/27/2015, 11/25/2016  . Tdap 11/27/2015   Review of Systems  Constitutional: Negative for diaphoresis.  HENT: Negative.   Eyes: Negative.   Respiratory: Negative.   Cardiovascular: Negative.  Negative for chest pain and palpitations.  Gastrointestinal: Negative.   Musculoskeletal: Negative.   Skin: Negative.   Neurological: Negative for weakness.  Endo/Heme/Allergies: Negative.   Psychiatric/Behavioral: Positive for depression. Negative for substance abuse and suicidal ideas. The patient has insomnia.     Objective:    BP 132/76 (BP Location: Left Arm, Patient Position: Sitting, Cuff Size: Large) Comment: MANUALLY  Pulse 75   Temp 98.5 F (36.9 C) (Oral)   Resp 16   Ht 5\' 6"  (1.676 m)   Wt 181 lb (82.1 kg)   SpO2 100%   BMI 29.21 kg/m   General Appearance:    Alert, cooperative, no distress, appears stated age  Head:    Normocephalic, without obvious abnormality, atraumatic  Eyes:    PERRL, conjunctiva/corneas clear, EOM's intact, fundi    benign, both eyes       Ears:    Normal TM's and external ear canals, both ears  Nose:   Nares normal, septum midline, mucosa normal, no drainage    or sinus tenderness  Throat:   Lips, mucosa, and tongue normal; teeth and gums normal  Neck:   Supple, symmetrical, trachea midline, no adenopathy;  thyroid:  No enlargement/tenderness/nodules; no carotid   bruit or JVD  Back:     Symmetric, no curvature, ROM normal, no CVA tenderness  Lungs:     Clear to auscultation bilaterally, respirations unlabored  Chest wall:    No tenderness or deformity  Heart:    Regular rate and rhythm, S1 and S2 normal, no murmur, rub   or gallop  Abdomen:     Soft, non-tender, bowel sounds active all four quadrants,    no masses, no organomegaly  Extremities:   Extremities normal, atraumatic, no cyanosis or edema  Pulses:   2+ and symmetric all extremities  Skin:   Skin  color, texture, turgor normal, no rashes or lesions  Lymph nodes:   Cervical, supraclavicular, and axillary nodes normal  Neurologic:   CNII-XII intact. Normal strength, sensation and reflexes      throughout      Assessment:  BP 132/76 (BP Location: Left Arm, Patient Position: Sitting, Cuff Size: Large) Comment: MANUALLY  Pulse 75   Temp 98.5 F (36.9 C) (Oral)   Resp 16   Ht 5\' 6"  (1.676 m)   Wt 181 lb (82.1 kg)   SpO2 100%   BMI 29.21 kg/m  Plan:  1. Essential hypertension Blood pressure is at goal on current medication regimen, no changes warranted on today. - lisinopril (PRINIVIL,ZESTRIL) 20 MG tablet; Take 1 tablet (20 mg total) daily with supper by mouth.  Dispense: 90 tablet; Refill: 1 - amLODipine (NORVASC) 5 MG tablet; Take 1 tablet (5 mg total) daily by mouth.  Dispense: 90 tablet; Refill: 1  2. Insomnia, unspecified type - traZODone (DESYREL) 50 MG tablet; Take 1 tablet (50 mg total) at bedtime as needed by mouth for sleep.  Dispense: 30 tablet; Refill: 3  3. Depression, unspecified depression type Depression screen [facility] 2/9 12/23/2016 12/23/2016 11/25/2016 08/26/2016 05/27/2016  Decreased Interest 2 0 0 0 0  Down, Depressed, Hopeless 2 1 1 1  0  PHQ - 2 Score 4 1 1 1  0  Altered sleeping 1 - - - -  Tired, decreased energy 2 - - - -  Change in appetite 0 - - - -  Feeling bad or failure about yourself  1 - - - -  Trouble concentrating 1 - - - -  Moving slowly or fidgety/restless 0 - - - -  Suicidal thoughts 0 - - - -  PHQ-9 Score 9 - - - -  Difficult doing work/chores - - - - -    - DULoxetine (CYMBALTA) 20 MG capsule; Take 1 capsule (20 mg total) daily by mouth.  Dispense: 90 capsule; Refill: 1  4. Tobacco abuse Smoking cessation instruction/counseling given:  counseled patient on the dangers of tobacco use, advised patient to stop smoking, and reviewed strategies to maximize success      RTC: 3 months for chronic conditions   Donia Pounds  MSN,  FNP-C [facility] [address] [place],  [address] [phone]

## 2017-01-16 ENCOUNTER — Encounter: Payer: Self-pay | Admitting: Family Medicine

## 2017-01-16 ENCOUNTER — Ambulatory Visit (INDEPENDENT_AMBULATORY_CARE_PROVIDER_SITE_OTHER): Payer: Medicaid Other | Admitting: Family Medicine

## 2017-01-16 VITALS — BP 136/76 | HR 89 | Temp 98.3°F | Resp 16 | Ht 66.0 in | Wt 184.0 lb

## 2017-01-16 DIAGNOSIS — M7989 Other specified soft tissue disorders: Secondary | ICD-10-CM | POA: Diagnosis not present

## 2017-01-16 DIAGNOSIS — L03115 Cellulitis of right lower limb: Secondary | ICD-10-CM | POA: Diagnosis not present

## 2017-01-16 DIAGNOSIS — L299 Pruritus, unspecified: Secondary | ICD-10-CM | POA: Diagnosis not present

## 2017-01-16 MED ORDER — HYDROXYZINE HCL 10 MG PO TABS: 10.0000 mg | ORAL_TABLET | Freq: Three times a day (TID) | ORAL | 0 refills | Status: DC | PRN

## 2017-01-16 MED ORDER — SULFAMETHOXAZOLE-TRIMETHOPRIM 800-160 MG PO TABS: 1.0000 | ORAL_TABLET | Freq: Two times a day (BID) | ORAL | 0 refills | Status: DC

## 2017-01-16 MED ORDER — IBUPROFEN 600 MG PO TABS: 600.0000 mg | ORAL_TABLET | Freq: Three times a day (TID) | ORAL | 0 refills | Status: AC | PRN

## 2017-01-16 NOTE — Patient Instructions (Addendum)
Right leg cellulitis with drainage, will start a trial of Bactrim 800-160 mg every 12 hours for 10 days.  Leg marked at the end of area of cellulitis.  Patient advised to refrain from using home remedies like bleach or turpentine.  Elevate lower extremity to heart level.  Wash with Dial antibacterial soap and pat dry.  Refrain from scratching right lower leg.  Will start hydroxyzine 10 mg 3 times a day as needed for increased itching.  We will follow-up on 01/19/2017.   Cellulitis, Adult Cellulitis is a skin infection. The infected area is usually red and sore. This condition occurs most often in the arms and lower legs. It is very important to get treated for this condition. Follow these instructions at home:  Take over-the-counter and prescription medicines only as told by your doctor.  If you were prescribed an antibiotic medicine, take it as told by your doctor. Do not stop taking the antibiotic even if you start to feel better.  Drink enough fluid to keep your pee (urine) clear or pale yellow.  Do not touch or rub the infected area.  Raise (elevate) the infected area above the level of your heart while you are sitting or lying down.  Place warm or cold wet cloths (warm or cold compresses) on the infected area. Do this as told by your doctor.  Keep all follow-up visits as told by your doctor. This is important. These visits let your doctor make sure your infection is not getting worse. Contact a doctor if:  You have a fever.  Your symptoms do not get better after 1-2 days of treatment.  Your bone or joint under the infected area starts to hurt after the skin has healed.  Your infection comes back. This can happen in the same area or another area.  You have a swollen bump in the infected area.  You have new symptoms.  You feel ill and also have muscle aches and pains. Get help right away if:  Your symptoms get worse.  You feel very sleepy.  You throw up (vomit) or have  watery poop (diarrhea) for a long time.  There are red streaks coming from the infected area.  Your red area gets larger.  Your red area turns darker. This information is not intended to replace advice given to you by your health care provider. Make sure you discuss any questions you have with your health care provider. Document Released: 07/10/2007 Document Revised: 06/29/2015 Document Reviewed: 11/30/2014 Elsevier Interactive Patient Education  2018 [employer].

## 2017-01-16 NOTE — Progress Notes (Signed)
Subjective:    Michael Davenport is a 56 y.o. male who presents for evaluation of swelling and drainage to right ankle wound.  Patient states that he has been scratching right ankle wound over the past several days.  He awakened this a.m. with redness and drainage.  Right leg is described as swollen and painful.  Patient denies fever, fatigue, chest pains, shortness of breath, or nausea.  . Precipitating event: break in the skin.  Patient has not attempted any over-the-counter interventions to alleviate current symptoms.   Past Medical History:  Diagnosis Date  . ETOH abuse 01/23/2015  . HTN (hypertension)   . Stroke [facility])    Social History   Socioeconomic History  . Marital status: Single    Spouse name: Not on file  . Number of children: 5  . Years of education: 9  . Highest education level: Not on file  Social Needs  . Financial resource strain: Not on file  . Food insecurity - worry: Not on file  . Food insecurity - inability: Not on file  . Transportation needs - medical: Not on file  . Transportation needs - non-medical: Not on file  Occupational History    Comment: unemployed  Tobacco Use  . Smoking status: Current Every Day Smoker    Packs/day: 0.50    Types: Cigarettes  . Smokeless tobacco: Never Used  . Tobacco comment: smokes 10 cigarettes a day  Substance and Sexual Activity  . Alcohol use: Yes    Alcohol/week: 8.4 oz    Types: 14 Cans of beer per week    Comment: 08/26/16 pt states doesn't know how much daily  . Drug use: No  . Sexual activity: Not on file  Other Topics Concern  . Not on file  Social History Narrative   Lives with friend   Caffeine- coffee, 3 cups daily   Immunization History  Administered Date(s) Administered  . Influenza,inj,Quad PF,6+ Mos 01/20/2015, 11/27/2015, 11/25/2016  . Tdap 11/27/2015  .Review of Systems  Constitutional: Negative.  Negative for chills and fever.  HENT: Negative.   Eyes: Negative.   Respiratory: Negative.     Cardiovascular: Positive for leg swelling (right leg cellulitus).  Gastrointestinal: Negative.   Genitourinary: Negative.   Musculoskeletal: Negative.  Negative for myalgias and neck pain.  Skin: Negative.   Neurological: Negative.   Psychiatric/Behavioral: Negative.    Objective:  .Physical Exam  Constitutional: He is oriented to person, place, and time. He appears well-developed and well-nourished.  Cardiovascular: Normal rate, regular rhythm, normal heart sounds and intact distal pulses.  Pulmonary/Chest: Effort normal.  Abdominal: Soft. Bowel sounds are normal.  Neurological: He is alert and oriented to person, place, and time.  Skin:     Excoriation, erythema, purulent drainage, marked with surgical pain    Assessment:  BP 136/76 (BP Location: Right Arm, Patient Position: Sitting, Cuff Size: Normal) Comment: manually  Pulse 89   Temp 98.3 F (36.8 C) (Oral)   Resp 16   Ht 5\' 6"  (1.676 m)   Wt 184 lb (83.5 kg)   SpO2 99%   BMI 29.70 kg/m   Cellulitis of the right lower extremity   Plan:  Cellulitis of right lower leg Antibiotics given in office. Neurosurgeon distributed. Wound edges marked for follow up. - sulfamethoxazole-trimethoprim (BACTRIM DS,SEPTRA DS) 800-160 MG tablet; Take 1 tablet by mouth 2 (two) times daily for 10 days.  Dispense: 20 tablet; Refill: 0 - WOUND CULTURE - CBC with Differential  Right leg swelling -  ibuprofen (ADVIL,MOTRIN) 600 MG tablet; Take 1 tablet (600 mg total) by mouth every 8 (eight) hours as needed.  Dispense: 30 tablet; Refill: 0 - sulfamethoxazole-trimethoprim (BACTRIM DS,SEPTRA DS) 800-160 MG tablet; Take 1 tablet by mouth 2 (two) times daily for 10 days.  Dispense: 20 tablet; Refill: 0 - CBC with Differential Pruritus - hydrOXYzine (ATARAX/VISTARIL) 10 MG tablet; Take 1 tablet (10 mg total) by mouth 3 (three) times daily as needed.  Dispense: 30 tablet; Refill: 0     RTC: 01/20/2017   Donia Pounds  MSN,  FNP-C [facility] [address] [place], [place] [address] [phone]

## 2017-01-17 LAB — CBC WITH DIFFERENTIAL/PLATELET
BASOS ABS: 0.1 10*3/uL (ref 0.0–0.2)
BASOS: 1 %
EOS (ABSOLUTE): 0.3 10*3/uL (ref 0.0–0.4)
Eos: 3 %
Hematocrit: 41.1 % (ref 37.5–51.0)
Hemoglobin: 14.3 g/dL (ref 13.0–17.7)
IMMATURE GRANS (ABS): 0 10*3/uL (ref 0.0–0.1)
Immature Granulocytes: 0 %
LYMPHS ABS: 2.1 10*3/uL (ref 0.7–3.1)
LYMPHS: 21 %
MCH: 30.9 pg (ref 26.6–33.0)
MCHC: 34.8 g/dL (ref 31.5–35.7)
MCV: 89 fL (ref 79–97)
Monocytes Absolute: 0.7 10*3/uL (ref 0.1–0.9)
Monocytes: 8 %
NEUTROS ABS: 6.6 10*3/uL (ref 1.4–7.0)
Neutrophils: 67 %
PLATELETS: 328 10*3/uL (ref 150–379)
RBC: 4.63 x10E6/uL (ref 4.14–5.80)
RDW: 14.1 % (ref 12.3–15.4)
WBC: 9.9 10*3/uL (ref 3.4–10.8)

## 2017-01-20 ENCOUNTER — Ambulatory Visit: Payer: Medicaid Other | Admitting: Family Medicine

## 2017-01-20 ENCOUNTER — Encounter: Payer: Self-pay | Admitting: Family Medicine

## 2017-01-20 VITALS — BP 131/70 | HR 75 | Temp 98.3°F | Resp 14 | Ht 66.0 in | Wt 184.0 lb

## 2017-01-20 DIAGNOSIS — L270 Generalized skin eruption due to drugs and medicaments taken internally: Secondary | ICD-10-CM

## 2017-01-20 DIAGNOSIS — L03115 Cellulitis of right lower limb: Secondary | ICD-10-CM

## 2017-01-20 LAB — WOUND CULTURE

## 2017-01-20 MED ORDER — CEPHALEXIN 500 MG PO CAPS: 500.0000 mg | ORAL_CAPSULE | Freq: Two times a day (BID) | ORAL | 0 refills | Status: AC

## 2017-01-20 MED ORDER — HYDROCORTISONE 1 % EX LOTN: 1.0000 "application " | TOPICAL_LOTION | Freq: Two times a day (BID) | CUTANEOUS | 0 refills | Status: DC

## 2017-01-20 NOTE — Patient Instructions (Signed)
Will discontinue Bactrim 800-160 mg twice daily due to generalized dermatitis.  Also recommend hypoallergenic products week laundry detergent, body wash, and/or lotions.  Will start a trial of hydrocortisone apply to rash twice daily.  We will also start Keflex 500 mg twice daily for 7 days.   The patient was given clear instructions to go to ER or return to medical center if symptoms do not improve, worsen or new problems develop. The patient verbalized understanding.    Cellulitis, Adult Cellulitis is a skin infection. The infected area is usually red and sore. This condition occurs most often in the arms and lower legs. It is very important to get treated for this condition. Follow these instructions at home:  Take over-the-counter and prescription medicines only as told by your doctor.  If you were prescribed an antibiotic medicine, take it as told by your doctor. Do not stop taking the antibiotic even if you start to feel better.  Drink enough fluid to keep your pee (urine) clear or pale yellow.  Do not touch or rub the infected area.  Raise (elevate) the infected area above the level of your heart while you are sitting or lying down.  Place warm or cold wet cloths (warm or cold compresses) on the infected area. Do this as told by your doctor.  Keep all follow-up visits as told by your doctor. This is important. These visits let your doctor make sure your infection is not getting worse. Contact a doctor if:  You have a fever.  Your symptoms do not get better after 1-2 days of treatment.  Your bone or joint under the infected area starts to hurt after the skin has healed.  Your infection comes back. This can happen in the same area or another area.  You have a swollen bump in the infected area.  You have new symptoms.  You feel ill and also have muscle aches and pains. Get help right away if:  Your symptoms get worse.  You feel very sleepy.  You throw up (vomit) or  have watery poop (diarrhea) for a long time.  There are red streaks coming from the infected area.  Your red area gets larger.  Your red area turns darker. This information is not intended to replace advice given to you by your health care provider. Make sure you discuss any questions you have with your health care provider. Document Released: 07/10/2007 Document Revised: 06/29/2015 Document Reviewed: 11/30/2014 Elsevier Interactive Patient Education  2018 [employer].

## 2017-01-20 NOTE — Progress Notes (Signed)
Subjective:    Michael Davenport is a 56 y.o. male who presents for a follow up of right lower leg cellulitis. Patient reports that drainage has improved. He also endorses a generalized, itchy rash since starting Bactrim 800-160 mg. He denies fever, fatigue, chest pain, shortness of breath, dysuria, nausea, vomiting, or diarrhea. Right leg is describes as swollen with mild discomfort.    Past Medical History:  Diagnosis Date  . ETOH abuse 01/23/2015  . HTN (hypertension)   . Stroke [facility])    Social History   Socioeconomic History  . Marital status: Single    Spouse name: Not on file  . Number of children: 5  . Years of education: 9  . Highest education level: Not on file  Social Needs  . Financial resource strain: Not on file  . Food insecurity - worry: Not on file  . Food insecurity - inability: Not on file  . Transportation needs - medical: Not on file  . Transportation needs - non-medical: Not on file  Occupational History    Comment: unemployed  Tobacco Use  . Smoking status: Current Every Day Smoker    Packs/day: 0.50    Types: Cigarettes  . Smokeless tobacco: Never Used  . Tobacco comment: smokes 10 cigarettes a day  Substance and Sexual Activity  . Alcohol use: Yes    Alcohol/week: 8.4 oz    Types: 14 Cans of beer per week    Comment: 08/26/16 pt states doesn't know how much daily  . Drug use: No  . Sexual activity: Not on file  Other Topics Concern  . Not on file  Social History Narrative   Lives with friend   Caffeine- coffee, 3 cups daily   Immunization History  Administered Date(s) Administered  . Influenza,inj,Quad PF,6+ Mos 01/20/2015, 11/27/2015, 11/25/2016  . Tdap 11/27/2015  .Review of Systems  Constitutional: Negative.  Negative for chills and fever.  HENT: Negative.   Eyes: Negative.   Respiratory: Negative.   Cardiovascular: Positive for leg swelling (right leg cellulitus).  Gastrointestinal: Negative.   Genitourinary: Negative.   Musculoskeletal:  Negative.  Negative for myalgias and neck pain.  Skin: Negative.   Neurological: Negative.   Psychiatric/Behavioral: Negative.    Objective:  .Physical Exam  Constitutional: He is oriented to person, place, and time. He appears well-developed and well-nourished.  Cardiovascular: Normal rate, regular rhythm, normal heart sounds and intact distal pulses.  Pulmonary/Chest: Effort normal.  Abdominal: Soft. Bowel sounds are normal.  Neurological: He is alert and oriented to person, place, and time.  Skin:     Erythema, non draining, 80 % granulated tissue.     Assessment:  BP 131/70 (BP Location: Right Arm, Patient Position: Sitting, Cuff Size: Large)   Pulse 75   Temp 98.3 F (36.8 C) (Oral)   Resp 14   Ht 5\' 6"  (1.676 m)   Wt 184 lb (83.5 kg)   SpO2 100%   BMI 29.70 kg/m   Cellulitis of the right lower extremity   Plan:  Cellulitis of right lower leg Discontinued Bactrim 800-160 mg, that was last taken this am due to generalized rash. Will start Keflex 500 mg every 12 hours for right lower leg cellulitis.  - cephALEXin (KEFLEX) 500 MG capsule; Take 1 capsule (500 mg total) by mouth 2 (two) times daily for 7 days.  Dispense: 14 capsule; Refill: 0  Dermatitis due to drug reaction - hydrocortisone 1 % lotion; Apply 1 application topically 2 (two) times daily.  Dispense: 118  mL; Refill: 0 RTC: 01/20/2017   Donia Pounds  MSN, FNP-C [facility] [address] [place], [place] [address] [phone]

## 2017-01-29 ENCOUNTER — Ambulatory Visit (INDEPENDENT_AMBULATORY_CARE_PROVIDER_SITE_OTHER): Payer: Medicaid Other | Admitting: Family Medicine

## 2017-01-29 ENCOUNTER — Encounter: Payer: Self-pay | Admitting: Family Medicine

## 2017-01-29 VITALS — BP 138/72 | HR 71 | Temp 98.3°F | Resp 16 | Ht 66.0 in | Wt 184.0 lb

## 2017-01-29 DIAGNOSIS — L03115 Cellulitis of right lower limb: Secondary | ICD-10-CM | POA: Diagnosis not present

## 2017-01-29 DIAGNOSIS — Z720 Tobacco use: Secondary | ICD-10-CM | POA: Diagnosis not present

## 2017-01-29 MED ORDER — DOXYCYCLINE HYCLATE 100 MG PO TABS: 100.0000 mg | ORAL_TABLET | Freq: Two times a day (BID) | ORAL | 0 refills | Status: AC

## 2017-01-29 NOTE — Patient Instructions (Signed)
For right leg cellulitis we will start doxycycline 100 mg twice daily for 10 days. Refrain from touching or rubbing the affected area Continue to wash with dial antibacterial soap.  Please refrain from removing scab.  Will follow up with on 02/10/2017   Cellulitis, Adult Cellulitis is a skin infection. The infected area is usually red and sore. This condition occurs most often in the arms and lower legs. It is very important to get treated for this condition. Follow these instructions at home:  Take over-the-counter and prescription medicines only as told by your doctor.  If you were prescribed an antibiotic medicine, take it as told by your doctor. Do not stop taking the antibiotic even if you start to feel better.  Drink enough fluid to keep your pee (urine) clear or pale yellow.  Do not touch or rub the infected area.  Raise (elevate) the infected area above the level of your heart while you are sitting or lying down.  Place warm or cold wet cloths (warm or cold compresses) on the infected area. Do this as told by your doctor.  Keep all follow-up visits as told by your doctor. This is important. These visits let your doctor make sure your infection is not getting worse. Contact a doctor if:  You have a fever.  Your symptoms do not get better after 1-2 days of treatment.  Your bone or joint under the infected area starts to hurt after the skin has healed.  Your infection comes back. This can happen in the same area or another area.  You have a swollen bump in the infected area.  You have new symptoms.  You feel ill and also have muscle aches and pains. Get help right away if:  Your symptoms get worse.  You feel very sleepy.  You throw up (vomit) or have watery poop (diarrhea) for a long time.  There are red streaks coming from the infected area.  Your red area gets larger.  Your red area turns darker. This information is not intended to replace advice given to you  by your health care provider. Make sure you discuss any questions you have with your health care provider. Document Released: 07/10/2007 Document Revised: 06/29/2015 Document Reviewed: 11/30/2014 Elsevier Interactive Patient Education  2018 [employer].

## 2017-01-29 NOTE — Progress Notes (Signed)
Subjective:    Michael Davenport is a 56 y.o. male who presents for a follow up of right lower leg cellulitis. Patient reports that drainage has improved. Patient denies pain, but endorses swelling to right ankle. He states that he has been applying "salve" due to increased dryness.   He denies fever, fatigue, chest pain, shortness of breath, dysuria, nausea, vomiting, or diarrhea.  Past Medical History:  Diagnosis Date  . ETOH abuse 01/23/2015  . HTN (hypertension)   . Stroke [facility])    Social History   Socioeconomic History  . Marital status: Single    Spouse name: Not on file  . Number of children: 5  . Years of education: 9  . Highest education level: Not on file  Social Needs  . Financial resource strain: Not on file  . Food insecurity - worry: Not on file  . Food insecurity - inability: Not on file  . Transportation needs - medical: Not on file  . Transportation needs - non-medical: Not on file  Occupational History    Comment: unemployed  Tobacco Use  . Smoking status: Current Every Day Smoker    Packs/day: 0.50    Types: Cigarettes  . Smokeless tobacco: Never Used  . Tobacco comment: smokes 10 cigarettes a day  Substance and Sexual Activity  . Alcohol use: Yes    Alcohol/week: 8.4 oz    Types: 14 Cans of beer per week    Comment: 08/26/16 pt states doesn't know how much daily  . Drug use: No  . Sexual activity: Not on file  Other Topics Concern  . Not on file  Social History Narrative   Lives with friend   Caffeine- coffee, 3 cups daily   Immunization History  Administered Date(s) Administered  . Influenza,inj,Quad PF,6+ Mos 01/20/2015, 11/27/2015, 11/25/2016  . Tdap 11/27/2015  .Review of Systems  Constitutional: Negative.  Negative for chills and fever.  HENT: Negative.   Eyes: Negative.   Respiratory: Negative.   Cardiovascular: Positive for leg swelling (right leg cellulitus).  Gastrointestinal: Negative.   Genitourinary: Negative.   Musculoskeletal:  Negative.  Negative for myalgias and neck pain.  Skin: Negative.   Neurological: Negative.   Psychiatric/Behavioral: Negative.    Objective:  .Physical Exam  Constitutional: He is oriented to person, place, and time. He appears well-developed and well-nourished.  Cardiovascular: Normal rate, regular rhythm, normal heart sounds and intact distal pulses.  Pulmonary/Chest: Effort normal.  Abdominal: Soft. Bowel sounds are normal.  Neurological: He is alert and oriented to person, place, and time.  Skin:     Erythema, mild right annkle edema, flaking, non draining, 90 % granulated tissue.     Assessment:  BP 138/72 (BP Location: Right Arm, Patient Position: Sitting, Cuff Size: Normal) Comment: MANUALLY  Pulse 71   Temp 98.3 F (36.8 C) (Oral)   Resp 16   Ht 5\' 6"  (1.676 m)   Wt 184 lb (83.5 kg)   SpO2 100%   BMI 29.70 kg/m   Cellulitis of the right lower extremity   Plan:  Cellulitis of leg without foot, right Will start a trial of doxycycline due to increased erythema and warmth for 10 days.  Refrain from applying home remedies or scratching tissue with nails. I suspect that patient is causing a secondary infection due to increased scratching and removing dry skin.  Will reassess right leg cellulitis on 02/10/2017 Continue to elevate right lower extremity to heart level while at rest Discussed increasing protein sources to promote healing  -  doxycycline (VIBRA-TABS) 100 MG tablet; Take 1 tablet (100 mg total) by mouth 2 (two) times daily for 10 days.  Dispense: 20 tablet; Refill: 0  Tobacco dependence Smoking cessation instruction/counseling given:  counseled patient on the dangers of tobacco use, advised patient to stop smoking, and reviewed strategies to maximize success  Donia Pounds  MSN, FNP-C [facility] [address] [place], [place] [address] [phone]

## 2017-02-10 ENCOUNTER — Ambulatory Visit (INDEPENDENT_AMBULATORY_CARE_PROVIDER_SITE_OTHER): Payer: Medicaid Other | Admitting: Family Medicine

## 2017-02-10 ENCOUNTER — Encounter: Payer: Self-pay | Admitting: Family Medicine

## 2017-02-10 VITALS — BP 138/68 | HR 76 | Temp 98.2°F | Resp 14 | Ht 66.0 in | Wt 186.0 lb

## 2017-02-10 DIAGNOSIS — F17200 Nicotine dependence, unspecified, uncomplicated: Secondary | ICD-10-CM | POA: Diagnosis not present

## 2017-02-10 DIAGNOSIS — L03115 Cellulitis of right lower limb: Secondary | ICD-10-CM | POA: Diagnosis not present

## 2017-02-10 NOTE — Progress Notes (Signed)
Subjective:    Merion Grimaldo is a 57 y.o. male who presents for a follow up of right lower leg cellulitis. Patient reports that drainage has improved. Mr. Yeager has completed a 10 day course of doxycycline twice daily. He was previously on Keflex TID without improvement. Patient denies pain, but endorses swelling to right ankle. He states that he has been applying "salve" due to increased dryness. He also says that he scratches right leg during sleep. He denies fever, fatigue, chest pain, shortness of breath, dysuria, nausea, vomiting, or diarrhea.  Past Medical History:  Diagnosis Date  . ETOH abuse 01/23/2015  . HTN (hypertension)   . Stroke [facility])    Social History   Socioeconomic History  . Marital status: Single    Spouse name: Not on file  . Number of children: 5  . Years of education: 9  . Highest education level: Not on file  Social Needs  . Financial resource strain: Not on file  . Food insecurity - worry: Not on file  . Food insecurity - inability: Not on file  . Transportation needs - medical: Not on file  . Transportation needs - non-medical: Not on file  Occupational History    Comment: unemployed  Tobacco Use  . Smoking status: Current Every Day Smoker    Packs/day: 0.50    Types: Cigarettes  . Smokeless tobacco: Never Used  . Tobacco comment: smokes 10 cigarettes a day  Substance and Sexual Activity  . Alcohol use: Yes    Alcohol/week: 8.4 oz    Types: 14 Cans of beer per week    Comment: 08/26/16 pt states doesn't know how much daily  . Drug use: No  . Sexual activity: Not on file  Other Topics Concern  . Not on file  Social History Narrative   Lives with friend   Caffeine- coffee, 3 cups daily   Immunization History  Administered Date(s) Administered  . Influenza,inj,Quad PF,6+ Mos 01/20/2015, 11/27/2015, 11/25/2016  . Tdap 11/27/2015  .Review of Systems  Constitutional: Negative.  Negative for chills and fever.  HENT: Negative.   Eyes: Negative.     Respiratory: Negative.   Cardiovascular: Positive for leg swelling (right leg cellulitus).  Gastrointestinal: Negative.   Genitourinary: Negative.   Musculoskeletal: Negative.  Negative for myalgias and neck pain.  Skin: Negative.   Neurological: Negative.   Psychiatric/Behavioral: Negative.    Objective:  .Physical Exam  Constitutional: He is oriented to person, place, and time. He appears well-developed and well-nourished.  Cardiovascular: Normal rate, regular rhythm, normal heart sounds and intact distal pulses.  Pulmonary/Chest: Effort normal.  Abdominal: Soft. Bowel sounds are normal.  Neurological: He is alert and oriented to person, place, and time.  Skin:     Erythema, mild right annkle edema, flaking, non draining, 90 % granulated tissue.   Excoriation to right lateral leg    Assessment:  BP 138/68 (BP Location: Left Arm, Patient Position: Sitting, Cuff Size: Normal) Comment: manually  Pulse 76   Temp 98.2 F (36.8 C) (Oral)   Resp 14   Ht 5\' 6"  (1.676 m)   Wt 186 lb (84.4 kg)   SpO2 100%   BMI 30.02 kg/m   Cellulitis of the right lower extremity   Plan:  Cellulitis of leg without foot, right Completed doxycycline due to increased erythema and warmth for 10 days.  Refrain from applying home remedies or scratching tissue with nails. I suspect that patient is causing a secondary infection due to increased scratching  and removing dry skin.  Will send a referral to wound care for consult.  Will defer to wound care for further treatment and evaluation Continue to elevate right lower extremity to heart level while at rest Discussed increasing protein sources to promote healing  - AMB referral to wound care center Tobacco dependence Smoking cessation instruction/counseling given:  counseled patient on the dangers of tobacco use, advised patient to stop smoking, and reviewed strategies to maximize success  Donia Pounds  MSN, FNP-C [facility] [address] [place], [place] [address] [phone]

## 2017-02-10 NOTE — Patient Instructions (Signed)
We will send a referral to wound care for further workup and evaluation of right leg cellulitis. Continue to elevate right leg to heart level while at rest.  Increase protein sources to promote healing.  Recommend smoking cessation. Cellulitis, Adult Cellulitis is a skin infection. The infected area is usually red and sore. This condition occurs most often in the arms and lower legs. It is very important to get treated for this condition. Follow these instructions at home:  Take over-the-counter and prescription medicines only as told by your doctor.  If you were prescribed an antibiotic medicine, take it as told by your doctor. Do not stop taking the antibiotic even if you start to feel better.  Drink enough fluid to keep your pee (urine) clear or pale yellow.  Do not touch or rub the infected area.  Raise (elevate) the infected area above the level of your heart while you are sitting or lying down.  Place warm or cold wet cloths (warm or cold compresses) on the infected area. Do this as told by your doctor.  Keep all follow-up visits as told by your doctor. This is important. These visits let your doctor make sure your infection is not getting worse. Contact a doctor if:  You have a fever.  Your symptoms do not get better after 1-2 days of treatment.  Your bone or joint under the infected area starts to hurt after the skin has healed.  Your infection comes back. This can happen in the same area or another area.  You have a swollen bump in the infected area.  You have new symptoms.  You feel ill and also have muscle aches and pains. Get help right away if:  Your symptoms get worse.  You feel very sleepy.  You throw up (vomit) or have watery poop (diarrhea) for a long time.  There are red streaks coming from the infected area.  Your red area gets larger.  Your red area turns darker. This information is not intended to replace advice given to you by your health care  provider. Make sure you discuss any questions you have with your health care provider. Document Released: 07/10/2007 Document Revised: 06/29/2015 Document Reviewed: 11/30/2014 Elsevier Interactive Patient Education  2018 [employer].

## 2017-02-24 ENCOUNTER — Encounter (HOSPITAL_BASED_OUTPATIENT_CLINIC_OR_DEPARTMENT_OTHER): Payer: Medicaid Other | Attending: Internal Medicine

## 2017-02-24 DIAGNOSIS — I10 Essential (primary) hypertension: Secondary | ICD-10-CM | POA: Insufficient documentation

## 2017-02-24 DIAGNOSIS — F17210 Nicotine dependence, cigarettes, uncomplicated: Secondary | ICD-10-CM | POA: Insufficient documentation

## 2017-02-24 DIAGNOSIS — I87301 Chronic venous hypertension (idiopathic) without complications of right lower extremity: Secondary | ICD-10-CM | POA: Insufficient documentation

## 2017-02-24 DIAGNOSIS — Z872 Personal history of diseases of the skin and subcutaneous tissue: Secondary | ICD-10-CM | POA: Diagnosis not present

## 2017-02-24 DIAGNOSIS — R269 Unspecified abnormalities of gait and mobility: Secondary | ICD-10-CM | POA: Insufficient documentation

## 2017-02-24 DIAGNOSIS — I69398 Other sequelae of cerebral infarction: Secondary | ICD-10-CM | POA: Diagnosis not present

## 2017-03-03 ENCOUNTER — Encounter (HOSPITAL_BASED_OUTPATIENT_CLINIC_OR_DEPARTMENT_OTHER): Payer: Medicaid Other

## 2017-03-10 ENCOUNTER — Ambulatory Visit: Payer: Medicaid Other | Admitting: Neurology

## 2017-03-24 ENCOUNTER — Encounter: Payer: Self-pay | Admitting: Neurology

## 2017-03-24 ENCOUNTER — Ambulatory Visit: Payer: Medicaid Other | Admitting: Neurology

## 2017-03-24 ENCOUNTER — Ambulatory Visit: Payer: Medicaid Other | Admitting: Family Medicine

## 2017-03-24 VITALS — BP 140/90 | HR 75 | Wt 185.2 lb

## 2017-03-24 DIAGNOSIS — E785 Hyperlipidemia, unspecified: Secondary | ICD-10-CM | POA: Diagnosis not present

## 2017-03-24 DIAGNOSIS — I63411 Cerebral infarction due to embolism of right middle cerebral artery: Secondary | ICD-10-CM

## 2017-03-24 DIAGNOSIS — I10 Essential (primary) hypertension: Secondary | ICD-10-CM | POA: Diagnosis not present

## 2017-03-24 DIAGNOSIS — F17200 Nicotine dependence, unspecified, uncomplicated: Secondary | ICD-10-CM | POA: Diagnosis not present

## 2017-03-24 DIAGNOSIS — R269 Unspecified abnormalities of gait and mobility: Secondary | ICD-10-CM

## 2017-03-24 DIAGNOSIS — I69398 Other sequelae of cerebral infarction: Secondary | ICD-10-CM | POA: Diagnosis not present

## 2017-03-24 NOTE — Patient Instructions (Addendum)
-   continue ASA and lipitor for stroke prevention  - check BP at home  - BP goal 130-150 due to left M1 occlusion - Follow up with your primary care physician for stroke risk factor modification. Recommend maintain blood pressure goal 130-150/70-90, diabetes with hemoglobin A1c goal below 6.5% and lipids with LDL cholesterol goal below 70 mg/dL.  - quit smoking - limit alcohol to 2 beers per day - if you have snoring, difficulty breathing at night or daytime sleepiness, you need to call us for a sleepy study - decrease gabapentin to one capsule at night to see if you still have tingling, if still bothers you, resume gabapentin back to one capsule three times a day - home exercise and healthy diet - use cane for safety  - follow up as needed

## 2017-03-24 NOTE — Progress Notes (Signed)
STROKE NEUROLOGY FOLLOW UP NOTE  NAME: Amore Grater DOB: [birth year]  REASON FOR VISIT: stroke follow up HISTORY FROM: pt and wife and chart  Today we had the pleasure of seeing Micholas Drumwright in follow-up at our Neurology Clinic. Pt was accompanied by wife and daughter.   History Summary Mr. Reese Stockman is a 57 y.o. male with hx of heavy smoker, heavy drinker admitted on 01/18/15 for acute right sided weakness. MRI showed acute left PLIC infarct and chronic left CR infarct. MRA showed left M1 proximal cut off with diffuse athero. CUS and TTE unremarkable. LDL 133 and A1C 5.7. He was put on ASA 325 and lipitor as well as FA, B1 and multivitamin and recommended smoking cessation. Found to have HTN, put on low dose lisinopril. He was discharged in good condition.  02/16/15 follow up - the patient has been doing better. Still has subtle right sided weakness but largely recovered. BP still high today 168/103 and he did not check BP at home. On lisinopril 2.5mg  now but will  Need to increase the dose. He has no insurance now and is working on the financial support with [employer]. He has PCP first visit next week. Not quit smoking yet.   05/24/15 follow up - the patient has been doing stable from stroke standpoint. He still has right-sided numbness, but has increased LBP for the last 2 weeks which caused him left lower extremity numbness and mild walking difficulty. He also complains left lower extremity muscle cramping. PCP prescribed tramadol. Wife also complains more sedentary lifestyle at home, the patient not sure whether he feels depressed. BP today 130/85. He continues heavy drinking and smoking.  02/26/16 follow up - patient has been doing the same. He still has mild right-sided weakness and hemiplegic gait with right sensory decreased. He did not have PT/OT due to insurance issue and did not the way he was treated in PT facility. He has not quit smoking yet and complains of his wife continues  smoking. He stated he has limited his drink for 2 drinks per day. BP today 145/81. He is on aspirin 81 instead of 325.  08/26/16 follow up (CM) - Mr. Kienast, 57 year old male returns for follow-up with history of stroke event in December 2016. He is currently on aspirin 81MG  for secondary stroke prevention without further stroke or TIA symptoms. He has no bruising and bleeding. He is also on Lipitor without myalgias. Blood pressure well controlled in the office today at 117/72. He gets no regular exercise. Right lower leg weakness has improved. Patient is currently not driving. He has had ongoing issues with sleep and has never had a sleep study. He continues to smoke and says he has no more than 2 drinks a day. His brother died of an MI at the age of 31 since last seen. He returns for reevaluation  Interval History During the interval time, patient has been doing well, no strokelike symptoms.  Declined sleep study at this time.  Patient denies any snoring, difficulty breathing at night, or daytime sleepiness.  Use cane for safety on walking, denies any fall.  Alcohol less than 2 drinks per day, but still smoking, less than half PPD.  BP 140/90.  REVIEW OF SYSTEMS: Full 14 system review of systems performed and notable only for those listed below and in HPI above, all others are negative:  Constitutional:  Activity change, appetite change, fatigue Cardiovascular:  Ear/Nose/Throat: Ringing in ears Skin:  Eyes: Eye discharge Respiratory:  Gastroitestinal:   Genitourinary:  Hematology/Lymphatic: Bleeding easily Endocrine:  Musculoskeletal: Joint pain, back pain, walking difficulty Allergy/Immunology:   Neurological:  Numbness, headache, weakness, memory loss Psychiatric: Agitation, confusion, depression Sleep: Frequent awakening, daytime sleepiness  The following represents the patient's updated allergies and side effects list: Allergies  Allergen Reactions  . Bactrim  [Sulfamethoxazole-Trimethoprim] Rash    The neurologically relevant items on the patient's problem list were reviewed on today's visit.  Neurologic Examination  A problem focused neurological exam (12 or more points of the single system neurologic examination, vital signs counts as 1 point, cranial nerves count for 8 points) was performed.  Blood pressure 140/90, pulse 75, weight 185 lb 3.2 oz (84 kg).  General - Well nourished, well developed, in no apparent distress.  Ophthalmologic - Sharp disc margins OU.   Cardiovascular - Regular rate and rhythm with no murmur.  Mental Status -  Level of arousal and orientation to place, and person were intact, but not orientated to time. Language including expression, naming, repetition, comprehension was assessed and found intact. Difficulty with spelling forward or backward, but able to calculate 3/3 registration and 2/3 delayed recall. Fund of Knowledge was assessed and was intact.  Cranial Nerves II - XII - II - Visual field intact OU. III, IV, VI - Extraocular movements intact. V - Facial sensation intact bilaterally. VII - Facial movement intact bilaterally. VIII - Hearing & vestibular intact bilaterally. X - Palate elevates symmetrically. XI - Chin turning & shoulder shrug intact bilaterally. XII - Tongue protrusion intact.  Motor Strength - The patient's strength was normal in all extremities and pronator drift was absent.  Bulk was normal and fasciculations were absent.   Motor Tone - Muscle tone was assessed at the neck and appendages and was normal.  Reflexes - The patient's reflexes were 1+ in all extremities and he had no pathological reflexes.  Sensory - Light touch, temperature/pinprick were assessed and were decreased on the right, 70% of left. Romberg testing showed teetering but no fall  Coordination - The patient had normal movements in the hands with no ataxia or dysmetria.  Tremor was absent.  Gait and Station -  slight hemiparetic gait on the right, difficulty with tandem gait.  Data reviewed: I personally reviewed the images and agree with the radiology interpretations.  Dg Chest 2 View 01/17/2015 No active cardiopulmonary disease.   Ct Head Wo Contrast 01/16/2015 1. No acute intracranial abnormality. 2. Cerebral/cerebellar atrophy and small vessel ischemic change.   Mri & Mra Head/brain Wo Cm 01/17/2015 Acute LEFT MCA lenticulostriate territory infarct without hemorrhage. LEFT M1 MCA large vessel occlusion. No significant recanalization of the LEFT M2 or M3 branches. Widespread intracranial atherosclerotic change as described, with potentially flow reducing lesions affecting the distal RIGHT MCA, proximal RIGHT ACA, distal RIGHT PCA, and tandem non stenotic lesions affecting the LEFT vertebral and proximal basilar. 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 Consider further assessment with CTA head due to motion degraded series.   Carotid Doppler  There is 1-39% bilateral ICA stenosis. Vertebral artery flow is antegrade.   2D Echocardiogram  - Left ventricle: The cavity size was normal. There was moderatefocal basal and mild concentric hypertrophy. Systolic functionwas normal. The estimated ejection fraction was in the range of55% to 60%. Wall motion was normal; there were no regional wallmotion abnormalities. - Atrial septum: There was increased thickness of the septum,consistent with lipomatous hypertrophy.   Component     Latest Ref Rng 01/16/2015 01/17/2015  Opiates     NONE DETECTED NONE DETECTED   COCAINE     NONE DETECTED NONE DETECTED   Benzodiazepines     NONE DETECTED NONE DETECTED   Amphetamines     NONE DETECTED NONE DETECTED   Tetrahydrocannabinol     NONE DETECTED POSITIVE (A)   Barbiturates     NONE DETECTED NONE DETECTED   Cholesterol     0 - 200 mg/dL  195  Triglycerides     <150 mg/dL  72  HDL Cholesterol     >40 mg/dL  48  Total  CHOL/HDL Ratio       4.1  VLDL     0 - 40 mg/dL  14  LDL (calc)     0 - 99 mg/dL  133 (H)  Hemoglobin A1C     4.8 - 5.6 %  5.7 (H)  Mean Plasma Glucose       117  Alcohol, Ethyl (B)     <5 mg/dL 45 (H)     Assessment: As you may recall, he is a 58 y.o. Caucasian male with PMH of heavy smoker and heavy drinker was admitted on 78/2956 for left PLIC infarct but also has chronic left CR infarct on MRI. MRA showed left M1 cut off. Found to have HTN also. CUS, TTE, A1C not remarkable. LDL 133. He was put on ASA 325 and lipitor and discharged to [facility]. During the interval time, he improved well, now almost recovered fully. BP initially not in good control. Increase lisinopril to 10mg  and PCP add amlodipine. BP better controlled. He continues smoking and Limited drink to 2 drinks per day, not quit yet. LDL improved to 60 in 11/2015. He still has mild right hemiparetic gait and right decreased sensation. Declined sleep study at this time, but still smoking.  Plan:  - continue ASA and lipitor for stroke prevention  - check BP at home  - BP goal 130-150 due to left M1 occlusion - Follow up with your primary care physician for stroke risk factor modification. Recommend maintain blood pressure goal 130-150/70-90, diabetes with hemoglobin A1c goal below 6.5% and lipids with LDL cholesterol goal below 70 mg/dL.  - quit smoking - limit alcohol to 2 beers per day - if you have snoring, difficulty breathing at night or daytime sleepiness, you need to call us for a sleepy study - decrease gabapentin to one capsule at night to see if you still have tingling, if still bothers you, resume gabapentin back to one capsule three times a day - home exercise and healthy diet - use cane for safety  - follow up as needed  I spent more than 25 minutes of face to face time with the patient. Greater than 50% of time was spent in counseling and coordination of care. We have discussed about sleep study, BP monitoring at  home, and quit smoking and Limited drinking.   No orders of the defined types were placed in this encounter.   No orders of the defined types were placed in this encounter.   Patient Instructions  - continue ASA and lipitor for stroke prevention  - check BP at home  - BP goal 130-150 due to left M1 occlusion - Follow up with your primary care physician for stroke risk factor modification. Recommend maintain blood pressure goal 130-150/70-90, diabetes with hemoglobin A1c goal below 6.5% and lipids with LDL cholesterol goal below 70 mg/dL.  - quit smoking - limit alcohol to 2 beers per day -  if you have snoring, difficulty breathing at night or daytime sleepiness, you need to call us for a sleepy study - decrease gabapentin to one capsule at night to see if you still have tingling, if still bothers you, resume gabapentin back to one capsule three times a day - home exercise and healthy diet - use cane for safety  - follow up as needed   Rosalin Hawking, MD PhD [facility] [address] [place], [place] [address] [phone]

## 2019-03-12 ENCOUNTER — Encounter: Payer: Self-pay | Admitting: Gastroenterology

## 2019-03-29 ENCOUNTER — Encounter: Payer: Medicaid Other | Admitting: Gastroenterology

## 2019-03-29 ENCOUNTER — Ambulatory Visit (AMBULATORY_SURGERY_CENTER): Payer: Self-pay | Admitting: *Deleted

## 2019-03-29 ENCOUNTER — Other Ambulatory Visit: Payer: Self-pay

## 2019-03-29 VITALS — Temp 97.7°F | Ht 68.0 in | Wt 175.0 lb

## 2019-03-29 DIAGNOSIS — Z86010 Personal history of colonic polyps: Secondary | ICD-10-CM

## 2019-03-29 DIAGNOSIS — Z01818 Encounter for other preprocedural examination: Secondary | ICD-10-CM

## 2019-03-29 MED ORDER — NA SULFATE-K SULFATE-MG SULF 17.5-3.13-1.6 GM/177ML PO SOLN: 1.0000 | Freq: Once | ORAL | 0 refills | Status: AC

## 2019-03-29 NOTE — Progress Notes (Signed)

## 2019-04-05 ENCOUNTER — Ambulatory Visit (INDEPENDENT_AMBULATORY_CARE_PROVIDER_SITE_OTHER): Payer: Medicaid Other

## 2019-04-05 ENCOUNTER — Other Ambulatory Visit: Payer: Self-pay | Admitting: Gastroenterology

## 2019-04-05 DIAGNOSIS — Z1159 Encounter for screening for other viral diseases: Secondary | ICD-10-CM

## 2019-04-06 LAB — SARS CORONAVIRUS 2 (TAT 6-24 HRS): SARS Coronavirus 2: NEGATIVE

## 2019-04-12 ENCOUNTER — Other Ambulatory Visit: Payer: Self-pay

## 2019-04-12 ENCOUNTER — Encounter: Payer: Self-pay | Admitting: Gastroenterology

## 2019-04-12 ENCOUNTER — Ambulatory Visit (AMBULATORY_SURGERY_CENTER): Payer: Medicaid Other | Admitting: Gastroenterology

## 2019-04-12 VITALS — BP 145/82 | HR 69 | Temp 96.6°F | Resp 18 | Ht 66.0 in | Wt 175.0 lb

## 2019-04-12 DIAGNOSIS — D123 Benign neoplasm of transverse colon: Secondary | ICD-10-CM

## 2019-04-12 DIAGNOSIS — Z86010 Personal history of colonic polyps: Secondary | ICD-10-CM

## 2019-04-12 MED ORDER — SODIUM CHLORIDE 0.9 % IV SOLN: 500.0000 mL | Freq: Once | INTRAVENOUS | Status: DC

## 2019-04-12 NOTE — Progress Notes (Signed)
Temp by ADB Vitals by CW   Pt's states no medical or surgical changes since previsit or office visit.

## 2019-04-12 NOTE — Progress Notes (Signed)
1116 Patient experienced vomiting with moderate amount of green bile noted, vss and no De Sat noted. Robinal and Zofran given

## 2019-04-12 NOTE — Progress Notes (Signed)
Called to room to assist during endoscopic procedure.  Patient ID and intended procedure confirmed with present staff. Received instructions for my participation in the procedure from the performing physician.  

## 2019-04-12 NOTE — Patient Instructions (Signed)
YOU HAD AN ENDOSCOPIC PROCEDURE TODAY AT THE [facility]:   Refer to the procedure report that was given to you for any specific questions about what was found during the examination.  If the procedure report does not answer your questions, please call your gastroenterologist to clarify.  If you requested that your care partner not be given the details of your procedure findings, then the procedure report has been included in a sealed envelope for you to review at your convenience later.  YOU SHOULD EXPECT: Some feelings of bloating in the abdomen. Passage of more gas than usual.  Walking can help get rid of the air that was put into your GI tract during the procedure and reduce the bloating. If you had a lower endoscopy (such as a colonoscopy or flexible sigmoidoscopy) you may notice spotting of blood in your stool or on the toilet paper. If you underwent a bowel prep for your procedure, you may not have a normal bowel movement for a few days.  Please Note:  You might notice some irritation and congestion in your nose or some drainage.  This is from the oxygen used during your procedure.  There is no need for concern and it should clear up in a day or so.  SYMPTOMS TO REPORT IMMEDIATELY:   Following lower endoscopy (colonoscopy or flexible sigmoidoscopy):  Excessive amounts of blood in the stool  Significant tenderness or worsening of abdominal pains  Swelling of the abdomen that is new, acute  Fever of 100F or higher  For urgent or emergent issues, a gastroenterologist can be reached at any hour by calling [phone]. Do not use MyChart messaging for urgent concerns.    DIET:  We do recommend a small meal at first, but then you may proceed to your regular diet.  Drink plenty of fluids but you should avoid alcoholic beverages for 24 hours.  ACTIVITY:  You should plan to take it easy for the rest of today and you should NOT DRIVE or use heavy machinery until tomorrow (because  of the sedation medicines used during the test).    FOLLOW UP: Our staff will call the number listed on your records 48-72 hours following your procedure to check on you and address any questions or concerns that you may have regarding the information given to you following your procedure. If we do not reach you, we will leave a message.  We will attempt to reach you two times.  During this call, we will ask if you have developed any symptoms of COVID 19. If you develop any symptoms (ie: fever, flu-like symptoms, shortness of breath, cough etc.) before then, please call [phone].  If you test positive for Covid 19 in the 2 weeks post procedure, please call and report this information to us.    If any biopsies were taken you will be contacted by phone or by letter within the next 1-3 weeks.  Please call us at [phone] if you have not heard about the biopsies in 3 weeks.    SIGNATURES/CONFIDENTIALITY: You and/or your care partner have signed paperwork which will be entered into your electronic medical record.  These signatures attest to the fact that that the information above on your After Visit Summary has been reviewed and is understood.  Full responsibility of the confidentiality of this discharge information lies with you and/or your care-partner. 

## 2019-04-12 NOTE — Progress Notes (Signed)
1126 Pt experienced another episode of vomiting large amount of green bile. vss O2 Sat remained greater 95%

## 2019-04-12 NOTE — Op Note (Signed)
[facility] Patient Name: Michael Davenport Procedure Date: 04/12/2019 10:51 AM MRN: [id number] Endoscopist: Remo Lipps P. Havery Moros , MD Age: 59 Referring MD:  Date of Birth: Mar 28, 1960 Gender: Male Account #: [id number] Procedure:                Colonoscopy Indications:              Surveillance: Personal history of adenomatous                            polyps on last colonoscopy 3 years ago Medicines:                Monitored Anesthesia Care Procedure:                Pre-Anesthesia Assessment:                           - Prior to the procedure, a History and Physical                            was performed, and patient medications and                            allergies were reviewed. The patient's tolerance of                            previous anesthesia was also reviewed. The risks                            and benefits of the procedure and the sedation                            options and risks were discussed with the patient.                            All questions were answered, and informed consent                            was obtained. Prior Anticoagulants: The patient has                            taken no previous anticoagulant or antiplatelet                            agents. ASA Grade Assessment: III - A patient with                            severe systemic disease. After reviewing the risks                            and benefits, the patient was deemed in                            satisfactory condition to undergo the procedure.  After obtaining informed consent, the colonoscope                            was passed under direct vision. Throughout the                            procedure, the patient's blood pressure, pulse, and                            oxygen saturations were monitored continuously. The                            Colonoscope was introduced through the anus and                            advanced to the the  cecum, identified by                            appendiceal orifice and ileocecal valve. The                            colonoscopy was performed without difficulty. The                            patient tolerated the procedure well. The quality                            of the bowel preparation was adequate. The                            ileocecal valve, appendiceal orifice, and rectum                            were photographed. Scope In: 11:06:37 AM Scope Out: 11:27:06 AM Scope Withdrawal Time: 0 hours 18 minutes 22 seconds  Total Procedure Duration: 0 hours 20 minutes 29 seconds  Findings:                 The perianal and digital rectal examinations were                            normal.                           A 3 mm polyp was found in the transverse colon. The                            polyp was sessile. The polyp was removed with a                            cold snare. Resection and retrieval were complete.                           A 4 mm polyp was found in the splenic flexure. The  polyp was sessile. The polyp was removed with a                            cold snare. Resection and retrieval were complete.                           Multiple small-mouthed diverticula were found in                            the left colon.                           The exam was otherwise without abnormality. Complications:            No immediate complications. Estimated blood loss:                            Minimal. Estimated Blood Loss:     Estimated blood loss was minimal. Impression:               - One 3 mm polyp in the transverse colon, removed                            with a cold snare. Resected and retrieved.                           - One 4 mm polyp at the splenic flexure, removed                            with a cold snare. Resected and retrieved.                           - Diverticulosis in the left colon.                           - The  examination was otherwise normal. Recommendation:           - Patient has a contact number available for                            emergencies. The signs and symptoms of potential                            delayed complications were discussed with the                            patient. Return to normal activities tomorrow.                            Written discharge instructions were provided to the                            patient.                           - Resume previous diet.                           -  Continue present medications.                           - Await pathology results. Remo Lipps P. Houda Brau, MD 04/12/2019 11:31:07 AM This report has been signed electronically.

## 2019-04-12 NOTE — Progress Notes (Signed)
Report given to PACU, vss O2 Sat > 95% on room air.

## 2019-04-14 ENCOUNTER — Telehealth: Payer: Self-pay | Admitting: *Deleted

## 2019-04-14 ENCOUNTER — Encounter: Payer: Self-pay | Admitting: Gastroenterology

## 2019-04-14 NOTE — Telephone Encounter (Signed)
Second follow up call attempt.  Message left on vm.

## 2019-04-14 NOTE — Telephone Encounter (Signed)
First follow up call attempt.  Left message on vm. 

## 2021-07-14 ENCOUNTER — Other Ambulatory Visit: Payer: Self-pay

## 2021-07-14 ENCOUNTER — Emergency Department (HOSPITAL_COMMUNITY)
Admission: EM | Admit: 2021-07-14 | Discharge: 2021-07-14 | Disposition: A | Discharge status: Home / Self Care | Payer: Medicaid Other | Attending: Emergency Medicine | Admitting: Emergency Medicine

## 2021-07-14 DIAGNOSIS — Z7982 Long term (current) use of aspirin: Secondary | ICD-10-CM | POA: Insufficient documentation

## 2021-07-14 DIAGNOSIS — Z79899 Other long term (current) drug therapy: Secondary | ICD-10-CM | POA: Diagnosis not present

## 2021-07-14 DIAGNOSIS — I10 Essential (primary) hypertension: Secondary | ICD-10-CM | POA: Diagnosis not present

## 2021-07-14 DIAGNOSIS — R040 Epistaxis: Secondary | ICD-10-CM | POA: Insufficient documentation

## 2021-07-14 MED ORDER — OXYMETAZOLINE HCL 0.05 % NA SOLN: 1.0000 | Freq: Two times a day (BID) | NASAL | 0 refills | Status: AC | PRN

## 2021-07-14 NOTE — ED Provider Notes (Signed)
[facility] DEPT Provider Note   CSN: [id number] Arrival date & time: 07/14/21  2119     History PMH: CVA, HLD, HTN, tobacco and alcohol use  Chief Complaint  Patient presents with   Epistaxis    Michael Davenport is a 61 y.o. male. Presents with left nare epistaxis for the past week.  He says he is wakes up every morning and has some mild nosebleeds on the left side.  He says these go away by tilting his head back.  They tend to be fine all day and return the next morning.  He went to his PCP who evaluated him but did not have any recommendations for getting the bleeding to stop when it starts.  He has no active bleeding right now.  Bleed most recently occurred this morning upon waking up.  He is not on any anticoagulation.  He has no history of nosebleeds. He does use a fan at night. He has no other symptoms such as dizziness, lightheadedness, nasal congestion, or sore throat.  No shortness of breath or difficulty breathing. No new bruising or bleeding from gums of mouth.  Epistaxis      Home Medications Prior to Admission medications   Medication Sig Start Date End Date Taking? Authorizing Provider  acetaminophen (TYLENOL 8 HOUR ARTHRITIS PAIN) 650 MG CR tablet Take 1 tablet (650 mg total) by mouth every 8 (eight) hours as needed for pain. 08/27/16   Dorena Dew, FNP  amLODipine (NORVASC) 5 MG tablet Take 1 tablet (5 mg total) daily by mouth. 12/23/16   Dorena Dew, FNP  aspirin EC 325 MG EC tablet Take 1 tablet (325 mg total) by mouth daily. 02/26/16   Rosalin Hawking, MD  atorvastatin (LIPITOR) 20 MG tablet Take 1 tablet (20 mg total) by mouth daily. 11/25/16   Dorena Dew, FNP  DULoxetine (CYMBALTA) 20 MG capsule Take 1 capsule (20 mg total) daily by mouth. 12/23/16   Dorena Dew, FNP  gabapentin (NEURONTIN) 300 MG capsule Take 1 capsule (300 mg total) by mouth 3 (three) times daily. 11/25/16   Dorena Dew, FNP  ibuprofen  (ADVIL,MOTRIN) 600 MG tablet Take 1 tablet (600 mg total) by mouth every 8 (eight) hours as needed. 01/16/17   Dorena Dew, FNP  lisinopril (PRINIVIL,ZESTRIL) 20 MG tablet Take 1 tablet (20 mg total) daily with supper by mouth. 12/23/16   Dorena Dew, FNP  Multiple Vitamin (MULTIVITAMIN WITH MINERALS) TABS tablet Take 1 tablet by mouth daily. Can get store brand. Available over the counter 01/25/15   Love, Ivan Anchors, PA-C  oxymetazoline (AFRIN) 0.05 % nasal spray Place 1 spray into left nostril 2 (two) times daily as needed for up to 7 days for congestion. 07/14/21 07/21/21 Yes Brylee Mcgreal, Adora Fridge, PA-C      Allergies    Bactrim [sulfamethoxazole-trimethoprim]    Review of Systems   Review of Systems  HENT:  Positive for nosebleeds.   All other systems reviewed and are negative.   Physical Exam Updated Vital Signs BP (!) 154/96 (BP Location: Left Arm)   Pulse 97   Temp 97.6 F (36.4 C) (Oral)   Resp 18   Ht '5\' 6"'$  (1.676 m)   Wt 72.6 kg   SpO2 98%   BMI 25.82 kg/m  Physical Exam Vitals and nursing note reviewed.  Constitutional:      General: He is not in acute distress.    Appearance: Normal appearance. He is well-developed.  He is not ill-appearing, toxic-appearing or diaphoretic.  HENT:     Head: Normocephalic and atraumatic.     Nose: No nasal deformity, congestion or rhinorrhea.     Right Nostril: No foreign body, epistaxis, septal hematoma or occlusion.     Left Nostril: No foreign body, epistaxis, septal hematoma or occlusion.     Right Turbinates: Not enlarged, swollen or pale.     Left Turbinates: Not enlarged, swollen or pale.     Comments: No evidence of active  bleeding from bilateral nare. I was not able to visualize any source for bleeding of internal structures.    Mouth/Throat:     Lips: Pink. No lesions.     Comments: Small amount of dry blood in posterior pharynx. No active bleeding or clots noted.  Eyes:     General: Gaze aligned appropriately. No  scleral icterus.       Right eye: No discharge.        Left eye: No discharge.     Conjunctiva/sclera: Conjunctivae normal.     Right eye: Right conjunctiva is not injected. No exudate or hemorrhage.    Left eye: Left conjunctiva is not injected. No exudate or hemorrhage. Pulmonary:     Effort: Pulmonary effort is normal. No respiratory distress.  Skin:    General: Skin is warm and dry.  Neurological:     Mental Status: He is alert and oriented to person, place, and time.  Psychiatric:        Mood and Affect: Mood normal.        Speech: Speech normal.        Behavior: Behavior normal. Behavior is cooperative.     ED Results / Procedures / Treatments   Labs (all labs ordered are listed, but only abnormal results are displayed) Labs Reviewed - No data to display  EKG None  Radiology No results found.  Procedures Procedures    Medications Ordered in ED Medications - No data to display  ED Course/ Medical Decision Making/ A&P                           Medical Decision Making Risk OTC drugs.   Patient here with intermittent left nare epistaxis for past week. He has no active bleeding today. No intervention is needed here to stop the bleed. They only occur in the mornings and seem to be fairly light. No symptoms to suggest anemia or thrombocytopenia so will defer blood work today. Not on any anticoagulation. Patient does use a fan at night so it could be related to dry air. Recommend saline nasal spray as well as air humidifier for sleeping. Prescribed Afrin to use as needed for nose bleeds and gave recommendations tor stopping the bleeding at home. I recommend outpatient follow up if bleeding does not get better as he will need to be worked up for alternative causes to his bleeding. At this point, there is no further indication for an emergency workup. Patient is HDS, no active bleeding or other concerning symptoms. Stable for discharge.   Final Clinical Impression(s) / ED  Diagnoses Final diagnoses:  Left-sided epistaxis    Rx / DC Orders ED Discharge Orders          Ordered    oxymetazoline (AFRIN) 0.05 % nasal spray  2 times daily PRN        07/14/21 1856  Sheila Oats 07/14/21 5625    Tegeler, Gwenyth Allegra, MD 07/14/21 [phone]

## 2021-07-14 NOTE — ED Triage Notes (Signed)
Patient reports nose bleed x1 week off and on. Denies injury. Denies use of anticoags. Denies pain. Says he went to pcp but they had nothing to stop bleed.

## 2021-07-14 NOTE — Discharge Instructions (Addendum)
Prevention Techniques: Try to sleep with air humidifier. Use daily saline nasal spray. You can by this over the counter at your pharmacy. Avoid touching or picking at inside of nose.   If bleeds occur, start by blowing the clots out as much as possible. Spray Oxymetazoline spray in the affected nostril. Hold pressure on the affected nare to stop the bleeding. ONLY USE THIS MEDICATION AS NEEDED. If you are continuing to have nose bleeds following these techniques, please schedule a follow up appointment with your PCP to rule out other causes. If your bleeding will not stop without intervention, please return to the emergency department.

## 2022-05-14 ENCOUNTER — Ambulatory Visit (INDEPENDENT_AMBULATORY_CARE_PROVIDER_SITE_OTHER): Payer: Medicaid Other | Admitting: Podiatry

## 2022-05-14 ENCOUNTER — Ambulatory Visit (INDEPENDENT_AMBULATORY_CARE_PROVIDER_SITE_OTHER): Payer: Medicaid Other

## 2022-05-14 DIAGNOSIS — M5417 Radiculopathy, lumbosacral region: Secondary | ICD-10-CM

## 2022-05-14 DIAGNOSIS — M7750 Other enthesopathy of unspecified foot: Secondary | ICD-10-CM

## 2022-05-14 DIAGNOSIS — M7752 Other enthesopathy of left foot: Secondary | ICD-10-CM | POA: Diagnosis not present

## 2022-05-14 DIAGNOSIS — M7751 Other enthesopathy of right foot: Secondary | ICD-10-CM | POA: Diagnosis not present

## 2022-05-14 DIAGNOSIS — G629 Polyneuropathy, unspecified: Secondary | ICD-10-CM | POA: Diagnosis not present

## 2022-05-19 NOTE — Progress Notes (Signed)
  Subjective:  Patient ID: Michael Davenport, male    DOB: [birth year],  MRN: [id number]  Chief Complaint  Patient presents with   Foot Pain    np bil bottom of foot pain for over a year, heel pain as well as ball of foot pain    62 y.o. male presents with the above complaint. History confirmed with patient.  He reports of burning tingling pain across the ball of both feet.  Says this has been worsened after his stroke.  He also developed a pinched nerve in his back years ago, not currently seeing any spine specialist for this  Objective:  Physical Exam: warm, good capillary refill, no trophic changes or ulcerative lesions, normal DP and PT pulses, and does have abnormal sensory exam, loss of protective sensation distally in the digits, he has good smooth range of motion of all joints without pain edema or tenderness.  Bilateral Tinel sign at the tarsal tunnel is negative  Bilateral foot radiographs show maintained joint spaces, mild pes planus deformity and dorsal spur of first MPJ on the right Assessment:   1. Lumbosacral radiculopathy   2. Peripheral polyneuropathy      Plan:  Patient was evaluated and treated and all questions answered.  Patient does not show any significant pain or functional limitations within his foot or ankle.  He does have a history of what he calls a pinched nerve in his back a few years ago that has been a problem for him he feels like his may be getting worse.  I have placed a referral to neurosurgery for him to be evaluated and treated.  Do not see any indication for further imaging or diagnostic testing as there are no focal deficits in areas of pain tenderness and he has good smooth range of motion of all joints.  Follow-up as needed.  Return if symptoms worsen or fail to improve.

## 2022-05-21 NOTE — Progress Notes (Signed)
Referral was faxed over today on 05/21/22.
